# Patient Record
Sex: Female | Born: 1980 | Race: White | Hispanic: No | Marital: Married | Smoking: Former smoker
Health system: Southern US, Community
[De-identification: ages and names within clinical notes are randomized; demographics above are authoritative.]

## PROBLEM LIST (undated history)

## (undated) DIAGNOSIS — G4700 Insomnia, unspecified: Secondary | ICD-10-CM

## (undated) DIAGNOSIS — F32A Depression, unspecified: Secondary | ICD-10-CM

## (undated) DIAGNOSIS — R55 Syncope and collapse: Secondary | ICD-10-CM

## (undated) DIAGNOSIS — N83201 Unspecified ovarian cyst, right side: Secondary | ICD-10-CM

## (undated) DIAGNOSIS — I871 Compression of vein: Secondary | ICD-10-CM

## (undated) DIAGNOSIS — K259 Gastric ulcer, unspecified as acute or chronic, without hemorrhage or perforation: Secondary | ICD-10-CM

## (undated) DIAGNOSIS — Z789 Other specified health status: Secondary | ICD-10-CM

## (undated) DIAGNOSIS — F329 Major depressive disorder, single episode, unspecified: Secondary | ICD-10-CM

## (undated) DIAGNOSIS — I872 Venous insufficiency (chronic) (peripheral): Secondary | ICD-10-CM

## (undated) DIAGNOSIS — F419 Anxiety disorder, unspecified: Secondary | ICD-10-CM

## (undated) HISTORY — PX: OVARIAN CYST SURGERY: SHX726

## (undated) HISTORY — PX: WRIST SURGERY: SHX841

## (undated) HISTORY — PX: TONSILLECTOMY: SUR1361

## (undated) HISTORY — PX: BREAST CYST ASPIRATION: SHX578

## (undated) HISTORY — DX: Insomnia, unspecified: G47.00

## (undated) HISTORY — PX: ABDOMINAL HYSTERECTOMY: SHX81

## (undated) HISTORY — PX: DILATION AND CURETTAGE OF UTERUS: SHX78

## (undated) HISTORY — DX: Unspecified ovarian cyst, right side: N83.201

## (undated) HISTORY — PX: OTHER SURGICAL HISTORY: SHX169

---

## 2015-11-25 HISTORY — PX: WRIST SURGERY: SHX841

## 2016-12-25 HISTORY — PX: OTHER SURGICAL HISTORY: SHX169

## 2017-02-12 ENCOUNTER — Other Ambulatory Visit: Payer: Self-pay | Admitting: Obstetrics and Gynecology

## 2017-02-12 ENCOUNTER — Other Ambulatory Visit (HOSPITAL_COMMUNITY)
Admission: RE | Admit: 2017-02-12 | Discharge: 2017-02-12 | Disposition: A | Discharge status: Home / Self Care | Source: Ambulatory Visit | Attending: Obstetrics and Gynecology | Admitting: Obstetrics and Gynecology

## 2017-02-12 DIAGNOSIS — Z1151 Encounter for screening for human papillomavirus (HPV): Secondary | ICD-10-CM | POA: Diagnosis present

## 2017-02-12 DIAGNOSIS — Z01419 Encounter for gynecological examination (general) (routine) without abnormal findings: Secondary | ICD-10-CM | POA: Insufficient documentation

## 2017-02-13 LAB — CYTOLOGY - PAP
DIAGNOSIS: NEGATIVE
HPV (WINDOPATH): NOT DETECTED

## 2017-05-17 ENCOUNTER — Other Ambulatory Visit: Payer: Self-pay | Admitting: Internal Medicine

## 2017-08-25 HISTORY — PX: ABDOMINAL HYSTERECTOMY: SHX81

## 2017-09-23 ENCOUNTER — Encounter (HOSPITAL_COMMUNITY): Payer: Self-pay

## 2017-09-23 ENCOUNTER — Inpatient Hospital Stay (HOSPITAL_COMMUNITY)
Admission: AD | Admit: 2017-09-23 | Discharge: 2017-09-24 | Disposition: A | Discharge status: Home / Self Care | Source: Ambulatory Visit | Attending: Family Medicine | Admitting: Family Medicine

## 2017-09-23 ENCOUNTER — Inpatient Hospital Stay (HOSPITAL_COMMUNITY)

## 2017-09-23 DIAGNOSIS — Z79899 Other long term (current) drug therapy: Secondary | ICD-10-CM | POA: Insufficient documentation

## 2017-09-23 DIAGNOSIS — G8918 Other acute postprocedural pain: Secondary | ICD-10-CM | POA: Diagnosis not present

## 2017-09-23 DIAGNOSIS — R5082 Postprocedural fever: Secondary | ICD-10-CM | POA: Diagnosis not present

## 2017-09-23 DIAGNOSIS — Z8719 Personal history of other diseases of the digestive system: Secondary | ICD-10-CM | POA: Diagnosis not present

## 2017-09-23 DIAGNOSIS — F419 Anxiety disorder, unspecified: Secondary | ICD-10-CM | POA: Diagnosis not present

## 2017-09-23 DIAGNOSIS — Z9889 Other specified postprocedural states: Secondary | ICD-10-CM

## 2017-09-23 DIAGNOSIS — I872 Venous insufficiency (chronic) (peripheral): Secondary | ICD-10-CM | POA: Diagnosis not present

## 2017-09-23 DIAGNOSIS — F329 Major depressive disorder, single episode, unspecified: Secondary | ICD-10-CM | POA: Insufficient documentation

## 2017-09-23 DIAGNOSIS — R109 Unspecified abdominal pain: Secondary | ICD-10-CM | POA: Diagnosis present

## 2017-09-23 DIAGNOSIS — K5901 Slow transit constipation: Secondary | ICD-10-CM | POA: Insufficient documentation

## 2017-09-23 DIAGNOSIS — M549 Dorsalgia, unspecified: Secondary | ICD-10-CM | POA: Diagnosis present

## 2017-09-23 HISTORY — DX: Anxiety disorder, unspecified: F41.9

## 2017-09-23 HISTORY — DX: Venous insufficiency (chronic) (peripheral): I87.2

## 2017-09-23 HISTORY — DX: Major depressive disorder, single episode, unspecified: F32.9

## 2017-09-23 HISTORY — DX: Gastric ulcer, unspecified as acute or chronic, without hemorrhage or perforation: K25.9

## 2017-09-23 HISTORY — DX: Depression, unspecified: F32.A

## 2017-09-23 LAB — COMPREHENSIVE METABOLIC PANEL
ALK PHOS: 44 U/L (ref 38–126)
ALT: 25 U/L (ref 14–54)
ANION GAP: 10 (ref 5–15)
AST: 26 U/L (ref 15–41)
Albumin: 3.9 g/dL (ref 3.5–5.0)
BILIRUBIN TOTAL: 0.6 mg/dL (ref 0.3–1.2)
BUN: 13 mg/dL (ref 6–20)
CALCIUM: 9.6 mg/dL (ref 8.9–10.3)
CO2: 29 mmol/L (ref 22–32)
Chloride: 100 mmol/L — ABNORMAL LOW (ref 101–111)
Creatinine, Ser: 0.78 mg/dL (ref 0.44–1.00)
Glucose, Bld: 86 mg/dL (ref 65–99)
Potassium: 4.3 mmol/L (ref 3.5–5.1)
Sodium: 139 mmol/L (ref 135–145)
TOTAL PROTEIN: 6.6 g/dL (ref 6.5–8.1)

## 2017-09-23 LAB — CBC WITH DIFFERENTIAL/PLATELET
BASOS PCT: 1 %
Basophils Absolute: 0.1 10*3/uL (ref 0.0–0.1)
EOS ABS: 0.4 10*3/uL (ref 0.0–0.7)
EOS PCT: 6 %
HCT: 33.1 % — ABNORMAL LOW (ref 36.0–46.0)
Hemoglobin: 11.2 g/dL — ABNORMAL LOW (ref 12.0–15.0)
Lymphocytes Relative: 32 %
Lymphs Abs: 1.9 10*3/uL (ref 0.7–4.0)
MCH: 31.6 pg (ref 26.0–34.0)
MCHC: 33.8 g/dL (ref 30.0–36.0)
MCV: 93.5 fL (ref 78.0–100.0)
MONO ABS: 0.5 10*3/uL (ref 0.1–1.0)
MONOS PCT: 8 %
Neutro Abs: 3.2 10*3/uL (ref 1.7–7.7)
Neutrophils Relative %: 53 %
Platelets: 337 10*3/uL (ref 150–400)
RBC: 3.54 MIL/uL — ABNORMAL LOW (ref 3.87–5.11)
RDW: 13 % (ref 11.5–15.5)
WBC: 6 10*3/uL (ref 4.0–10.5)

## 2017-09-23 LAB — URINALYSIS, ROUTINE W REFLEX MICROSCOPIC
BILIRUBIN URINE: NEGATIVE
Bacteria, UA: NONE SEEN
Glucose, UA: NEGATIVE mg/dL
HGB URINE DIPSTICK: NEGATIVE
Ketones, ur: NEGATIVE mg/dL
NITRITE: NEGATIVE
PH: 7 (ref 5.0–8.0)
Protein, ur: NEGATIVE mg/dL
SPECIFIC GRAVITY, URINE: 1.011 (ref 1.005–1.030)

## 2017-09-23 MED ORDER — HYDROMORPHONE HCL 2 MG/ML IJ SOLN
2.0000 mg | INTRAMUSCULAR | Status: DC | PRN
  Administered 2017-09-23: 2 mg via INTRAVENOUS
  Filled 2017-09-23: qty 1

## 2017-09-23 MED ORDER — KETOROLAC TROMETHAMINE 30 MG/ML IJ SOLN
15.0000 mg | Freq: Once | INTRAMUSCULAR | Status: AC
  Administered 2017-09-23: 15 mg via INTRAVENOUS
  Filled 2017-09-23: qty 1

## 2017-09-23 MED ORDER — HYDROMORPHONE HCL 1 MG/ML IJ SOLN
1.0000 mg | INTRAMUSCULAR | Status: DC | PRN
  Administered 2017-09-23: 1 mg via INTRAVENOUS
  Filled 2017-09-23: qty 1

## 2017-09-23 MED ORDER — SODIUM CHLORIDE 0.9 % IV SOLN
INTRAVENOUS | Status: DC
  Administered 2017-09-23: 22:00:00 via INTRAVENOUS

## 2017-09-23 MED ORDER — IOPAMIDOL (ISOVUE-300) INJECTION 61%
100.0000 mL | Freq: Once | INTRAVENOUS | Status: AC | PRN
  Administered 2017-09-23: 100 mL via INTRAVENOUS

## 2017-09-23 NOTE — MAU Provider Note (Signed)
History     CSN: [id number]  Arrival date and time: 09/23/17 2014   First Provider Initiated Contact with Patient 09/23/17 2114      Chief Complaint  Patient presents with  . Abdominal Pain  . Back Pain  . Vaginal Pain   36 y.o. non-pregnant female here with post-op abdominal pain, distention, and fever. She is 6 days post-op from [facility], A&P repair, and TVT sling. She had fever of 103 around noon today. She also reports worsening lower abdominal pain and abdominal distention today. She took Tylenol and her temp was down to 101. She is having 3 soft BMs every day. She started using Miralax yesterday as directed by her surgeon.   Past Medical History:  Diagnosis Date  . Anxiety   . Depression   . Stomach ulcer   . Venous insufficiency     Past Surgical History:  Procedure Laterality Date  . ABDOMINAL HYSTERECTOMY    . anterior posterior vaginal wall repair    . bladder tact    . DILATION AND CURETTAGE OF UTERUS    . left ovary removal    . OVARIAN CYST SURGERY    . TONSILLECTOMY    . WRIST SURGERY      Family History  Problem Relation Age of Onset  . Cancer Sister     Social History  Substance Use Topics  . Smoking status: Never Smoker  . Smokeless tobacco: Never Used  . Alcohol use No    Allergies:  Allergies  Allergen Reactions  . Lunesta [Eszopiclone] Other (See Comments)    hallucinations  . Morphine And Related Other (See Comments)    Severe migraine   . Amoxicillin Rash  . Penicillins Rash    Prescriptions Prior to Admission  Medication Sig Dispense Refill Last Dose  . acetaminophen (TYLENOL) 500 MG tablet Take 1,000 mg by mouth every 6 (six) hours as needed.   09/23/2017 at Unknown time  . buPROPion (WELLBUTRIN SR) 150 MG 12 hr tablet Take 150 mg by mouth 2 (two) times daily.   09/23/2017 at Unknown time  . clonazePAM (KLONOPIN) 1 MG tablet Take 1 mg by mouth daily.   09/22/2017 at Unknown time  . diazepam (VALIUM) 5 MG tablet 5 mg every 6 (six)  hours as needed for anxiety (vaginal route not oral).   09/23/2017 at Unknown time  . gabapentin (NEURONTIN) 100 MG capsule Take 200 mg by mouth 3 (three) times daily.   09/23/2017 at Unknown time  . Multiple Vitamin (MULTIVITAMIN) capsule Take 1 capsule by mouth daily.   09/23/2017 at Unknown time  . omeprazole (PRILOSEC) 10 MG capsule Take 10 mg by mouth daily.   09/23/2017 at Unknown time  . polyethylene glycol (MIRALAX / GLYCOLAX) packet Take 17 g by mouth daily.   09/23/2017 at Unknown time  . sertraline (ZOLOFT) 100 MG tablet Take 200 mg by mouth daily.   09/23/2017 at Unknown time  . zolpidem (AMBIEN) 5 MG tablet Take 5 mg by mouth at bedtime as needed for sleep.   09/22/2017 at Unknown time    Review of Systems  Constitutional: Negative for fever.  Gastrointestinal: Positive for abdominal pain and nausea. Negative for constipation and diarrhea.  Genitourinary: Positive for difficulty urinating (doesn't empty, self cath each void) and pelvic pain. Negative for vaginal bleeding.   Physical Exam   Blood pressure 123/77, pulse 65, temperature 97.8 F (36.6 C), temperature source Oral, resp. rate 20, height  (1.753 m), weight 160 lb (  72.6 kg), SpO2 100 %.  Physical Exam  Constitutional: She is oriented to person, place, and time. She appears well-developed and well-nourished. She appears distressed (appears intermittently uncomfortable).  HENT:  Head: Normocephalic and atraumatic.  Neck: Normal range of motion.  Cardiovascular: Normal rate.   Respiratory: Effort normal. No respiratory distress.  GI: Soft. Bowel sounds are normal. She exhibits distension (mild). She exhibits no mass. There is tenderness (all quadrants). There is no rebound and no guarding.  Genitourinary:  Genitourinary Comments: Speculum: cuff intact, no drainage or hematoma present (exam by Dr. Shawnie Pons)  Musculoskeletal: Normal range of motion.  Neurological: She is alert and oriented to person, place, and time.   Skin: Skin is warm and dry.  Psychiatric: She has a normal mood and affect.   Results for orders placed or performed during the hospital encounter of 09/23/17 (from the past 24 hour(s))  Urinalysis, Routine w reflex microscopic     Status: Abnormal   Collection Time: 09/23/17  8:20 PM  Result Value Ref Range   Color, Urine YELLOW YELLOW   APPearance CLEAR CLEAR   Specific Gravity, Urine 1.011 1.005 - 1.030   pH 7.0 5.0 - 8.0   Glucose, UA NEGATIVE NEGATIVE mg/dL   Hgb urine dipstick NEGATIVE NEGATIVE   Bilirubin Urine NEGATIVE NEGATIVE   Ketones, ur NEGATIVE NEGATIVE mg/dL   Protein, ur NEGATIVE NEGATIVE mg/dL   Nitrite NEGATIVE NEGATIVE   Leukocytes, UA SMALL (A) NEGATIVE   RBC / HPF 0-5 0 - 5 RBC/hpf   WBC, UA 6-30 0 - 5 WBC/hpf   Bacteria, UA NONE SEEN NONE SEEN   Squamous Epithelial / LPF 0-5 (A) NONE SEEN   Mucus PRESENT   CBC with Differential/Platelet     Status: Abnormal   Collection Time: 09/23/17  9:06 PM  Result Value Ref Range   WBC 6.0 4.0 - 10.5 K/uL   RBC 3.54 (L) 3.87 - 5.11 MIL/uL   Hemoglobin 11.2 (L) 12.0 - 15.0 g/dL   HCT 16.1 (L) 09.6 - 04.5 %   MCV 93.5 78.0 - 100.0 fL   MCH 31.6 26.0 - 34.0 pg   MCHC 33.8 30.0 - 36.0 g/dL   RDW 40.9 81.1 - 91.4 %   Platelets 337 150 - 400 K/uL   Neutrophils Relative % 53 %   Neutro Abs 3.2 1.7 - 7.7 K/uL   Lymphocytes Relative 32 %   Lymphs Abs 1.9 0.7 - 4.0 K/uL   Monocytes Relative 8 %   Monocytes Absolute 0.5 0.1 - 1.0 K/uL   Eosinophils Relative 6 %   Eosinophils Absolute 0.4 0.0 - 0.7 K/uL   Basophils Relative 1 %   Basophils Absolute 0.1 0.0 - 0.1 K/uL  Comprehensive metabolic panel     Status: Abnormal   Collection Time: 09/23/17  9:06 PM  Result Value Ref Range   Sodium 139 135 - 145 mmol/L   Potassium 4.3 3.5 - 5.1 mmol/L   Chloride 100 (L) 101 - 111 mmol/L   CO2 29 22 - 32 mmol/L   Glucose, Bld 86 65 - 99 mg/dL   BUN 13 6 - 20 mg/dL   Creatinine, Ser 7.82 0.44 - 1.00 mg/dL   Calcium 9.6 8.9 -  95.6 mg/dL   Total Protein 6.6 6.5 - 8.1 g/dL   Albumin 3.9 3.5 - 5.0 g/dL   AST 26 15 - 41 U/L   ALT 25 14 - 54 U/L   Alkaline Phosphatase 44 38 - 126 U/L  Total Bilirubin 0.6 0.3 - 1.2 mg/dL   GFR calc non Af Amer >60 >60 mL/min   GFR calc Af Amer >60 >60 mL/min   Anion gap 10 5 - 15   Ct Abdomen Pelvis W Contrast  Result Date: 09/24/2017 CLINICAL DATA:  Fever, abdominal pain, and vaginal pain starting today. Postoperative vaginal hysterectomy, vaginal wall repair, bladder and rectal tacking on Monday. EXAM: CT ABDOMEN AND PELVIS WITH CONTRAST TECHNIQUE: Multidetector CT imaging of the abdomen and pelvis was performed using the standard protocol following bolus administration of intravenous contrast. CONTRAST:  ISOVUE-300 IOPAMIDOL (ISOVUE-300) INJECTION 61% COMPARISON:  None. FINDINGS: Lower chest: The lung bases are clear. Hepatobiliary: No focal liver abnormality is seen. No gallstones, gallbladder wall thickening, or biliary dilatation. Pancreas: Unremarkable. No pancreatic ductal dilatation or surrounding inflammatory changes. Spleen: Normal in size without focal abnormality. Adrenals/Urinary Tract: Adrenal glands are unremarkable. Kidneys are normal, without renal calculi, focal lesion, or hydronephrosis. Bladder is unremarkable. Stomach/Bowel: Stomach and small bowel are decompressed. No wall thickening is appreciated. Contrast material progresses to the ileocecal valve suggesting no evidence of small bowel obstruction. The colon is diffusely stool-filled without wall thickening. This suggests constipation. The appendix is normal. There is a small amount of free fluid in the pelvis around the rectum. This could be physiologic, postoperative, or inflammatory. No loculated fluid collections to suggest an abscess. Vascular/Lymphatic: No significant vascular findings are present. No enlarged abdominal or pelvic lymph nodes. Reproductive: Surgical absence of the uterus. Probable involuting  follicles in the right ovary. Ovaries are not enlarged. Other: No free air in the abdomen. Abdominal wall musculature appears intact. Musculoskeletal: No acute or significant osseous findings. IMPRESSION: 1. Surgical absence of the uterus. Probable involuting cyst in the right ovary. Small amount of free fluid in the pelvis could be physiologic, reactive, or postoperative. No loculated fluid to suggest an abscess. 2. No evidence of bowel obstruction or inflammation. 3. Diffusely stool-filled colon suggesting constipation. Electronically Signed   By: Burman Nieves M.D.   On: 09/24/2017 00:16   MAU Course  Procedures Toradol Dilaudid  MDM Labs and CT ordered and reviewed. No evidence of post-op complication including infection. CT indicates constipation and this could be the source of pain. Consult with Dr. Shawnie Pons. Stable for discharge home, continue Miralax TID. Recommend to pt to notify surgeon tomorrow of sx and evaluation here.   Assessment and Plan   1. Post-operative state   2. Slow transit constipation   3.      Post-op fever 4.      Post-op abdominal pain  Discharge home Follow up with Dr. Lula Olszewski tomorrow Return for worsening sx Continue previous meds including Miralax TID  Allergies as of 09/24/2017      Reactions   Lunesta [eszopiclone] Other (See Comments)   hallucinations   Morphine And Related Other (See Comments)   Severe migraine   Amoxicillin Rash   Penicillins Rash      Medication List    TAKE these medications   acetaminophen 500 MG tablet Commonly known as:  TYLENOL Take 1,000 mg by mouth every 6 (six) hours as needed.   buPROPion 150 MG 12 hr tablet Commonly known as:  WELLBUTRIN SR Take 150 mg by mouth 2 (two) times daily.   clonazePAM 1 MG tablet Commonly known as:  KLONOPIN Take 1 mg by mouth daily.   diazepam 5 MG tablet Commonly known as:  VALIUM 5 mg every 6 (six) hours as needed for anxiety (vaginal route  not oral).   gabapentin 100 MG  capsule Commonly known as:  NEURONTIN Take 200 mg by mouth 3 (three) times daily.   multivitamin capsule Take 1 capsule by mouth daily.   omeprazole 10 MG capsule Commonly known as:  PRILOSEC Take 10 mg by mouth daily.   polyethylene glycol packet Commonly known as:  MIRALAX / GLYCOLAX Take 17 g by mouth daily.   sertraline 100 MG tablet Commonly known as:  ZOLOFT Take 200 mg by mouth daily.   zolpidem 5 MG tablet Commonly known as:  AMBIEN Take 5 mg by mouth at bedtime as needed for sleep.            Discharge Care Instructions        Start     Ordered   09/24/17 0000  Discharge patient    Question Answer Comment  Discharge disposition 01-Home or Self Care   Discharge patient date 09/24/2017      09/24/17 0037     Donette Larry, CNM 09/24/2017, 12:39 AM

## 2017-09-23 NOTE — MAU Note (Signed)
Pt here with c/o fever starting today, abdominal, back and vaginal pain. Post surgery patient. Had procedure this past Monday; vaginal hysterectomy, vaginal wall repair, bladder and rectal tacking as well. Reports fever today was 103 at noon, took Tylenol. Was back up to 101.7 at 1930 and took Tylenol again.

## 2017-09-23 NOTE — MAU Note (Signed)
Pt finished drinking her contrast at 2230. Radiology notifed.

## 2017-09-24 DIAGNOSIS — R109 Unspecified abdominal pain: Secondary | ICD-10-CM

## 2017-09-24 DIAGNOSIS — K5901 Slow transit constipation: Secondary | ICD-10-CM

## 2017-09-24 DIAGNOSIS — R5082 Postprocedural fever: Secondary | ICD-10-CM

## 2017-09-24 DIAGNOSIS — G8918 Other acute postprocedural pain: Secondary | ICD-10-CM | POA: Diagnosis not present

## 2017-09-24 NOTE — Discharge Instructions (Signed)

## 2017-09-26 ENCOUNTER — Telehealth: Payer: Self-pay | Admitting: Certified Nurse Midwife

## 2017-09-26 DIAGNOSIS — N390 Urinary tract infection, site not specified: Secondary | ICD-10-CM

## 2017-09-26 DIAGNOSIS — T83511A Infection and inflammatory reaction due to indwelling urethral catheter, initial encounter: Principal | ICD-10-CM

## 2017-09-26 LAB — URINE CULTURE: Culture: 60000 — AB

## 2017-09-26 MED ORDER — SULFAMETHOXAZOLE-TRIMETHOPRIM 800-160 MG PO TABS
1.0000 | ORAL_TABLET | Freq: Two times a day (BID) | ORAL | 0 refills | Status: DC
Start: 2017-09-26 — End: 2017-09-27

## 2017-09-26 NOTE — Telephone Encounter (Signed)
Notified pt of +UTI. Rx for Bactrim sent to pharmacy.

## 2017-09-27 ENCOUNTER — Telehealth: Payer: Self-pay | Admitting: Obstetrics and Gynecology

## 2017-09-27 ENCOUNTER — Inpatient Hospital Stay (HOSPITAL_COMMUNITY)
Admission: AD | Admit: 2017-09-27 | Discharge: 2017-09-27 | Disposition: A | Discharge status: Home / Self Care | Source: Ambulatory Visit | Attending: Obstetrics and Gynecology | Admitting: Obstetrics and Gynecology

## 2017-09-27 DIAGNOSIS — N3000 Acute cystitis without hematuria: Secondary | ICD-10-CM | POA: Diagnosis not present

## 2017-09-27 DIAGNOSIS — G8918 Other acute postprocedural pain: Secondary | ICD-10-CM | POA: Insufficient documentation

## 2017-09-27 DIAGNOSIS — N390 Urinary tract infection, site not specified: Secondary | ICD-10-CM | POA: Diagnosis present

## 2017-09-27 DIAGNOSIS — Z880 Allergy status to penicillin: Secondary | ICD-10-CM | POA: Insufficient documentation

## 2017-09-27 LAB — CBC WITH DIFFERENTIAL/PLATELET
Basophils Absolute: 0 10*3/uL (ref 0.0–0.1)
Basophils Relative: 0 %
EOS PCT: 4 %
Eosinophils Absolute: 0.3 10*3/uL (ref 0.0–0.7)
HCT: 34.3 % — ABNORMAL LOW (ref 36.0–46.0)
HEMOGLOBIN: 11.6 g/dL — AB (ref 12.0–15.0)
LYMPHS ABS: 2.1 10*3/uL (ref 0.7–4.0)
Lymphocytes Relative: 23 %
MCH: 31.4 pg (ref 26.0–34.0)
MCHC: 33.8 g/dL (ref 30.0–36.0)
MCV: 92.7 fL (ref 78.0–100.0)
MONOS PCT: 8 %
Monocytes Absolute: 0.7 10*3/uL (ref 0.1–1.0)
Neutro Abs: 5.8 10*3/uL (ref 1.7–7.7)
Neutrophils Relative %: 65 %
Platelets: 365 10*3/uL (ref 150–400)
RBC: 3.7 MIL/uL — AB (ref 3.87–5.11)
RDW: 13 % (ref 11.5–15.5)
WBC: 8.9 10*3/uL (ref 4.0–10.5)

## 2017-09-27 MED ORDER — CIPROFLOXACIN HCL 250 MG PO TABS: 250.0000 mg | ORAL_TABLET | Freq: Two times a day (BID) | ORAL | 0 refills | Status: DC

## 2017-09-27 MED ORDER — KETOROLAC TROMETHAMINE 60 MG/2ML IM SOLN
60.0000 mg | Freq: Once | INTRAMUSCULAR | Status: AC
  Administered 2017-09-27: 60 mg via INTRAMUSCULAR
  Filled 2017-09-27: qty 2

## 2017-09-27 NOTE — MAU Provider Note (Signed)
History     CSN: [id number]  Arrival date and time: 09/27/17 7253   First Provider Initiated Contact with Patient 09/27/17 1909      Chief Complaint  Patient presents with  . Urinary Tract Infection   HPI  [name] is a 36 y.o. [id number] female who presents to MAU today with complaint of worsening UTI symptoms. The patient had extensive urogynecology surgery last week and developed fever over the weekend. She was seen in MAU on 09/23/17 and called with urine culture results and started on Bactrim for UTI. She states that she had a reaction to the Bactrim and was unable to talk to her surgeon, so she called her PCP. Her PCP called MAU today and stated that she may require IV antibiotics because her culture showed resistance to most antibiotics. In review of the culture from 9/30, it is + E.coli and sensitive to all antibiotics tested. The patient states that she has had low grade temperatures since being seen in MAU, but nothing as high as when she was seen this weekend. She has been taking Tylenol for fever. She is also taking Gabapentin and Oxycodone for pain since the surgery. She has also noted bilateral flank pain today. She is having to self-cath to completely empty her bladder.   OB History    Gravida Para Term Preterm AB Living   SAB TAB Ectopic Multiple Live Births   3       2      Past Medical History:  Diagnosis Date  . Anxiety   . Depression   . Stomach ulcer   . Venous insufficiency     Past Surgical History:  Procedure Laterality Date  . ABDOMINAL HYSTERECTOMY    . anterior posterior vaginal wall repair    . bladder tact    . DILATION AND CURETTAGE OF UTERUS    . left ovary removal    . OVARIAN CYST SURGERY    . TONSILLECTOMY    . WRIST SURGERY      Family History  Problem Relation Age of Onset  . Cancer Sister     Social History  Substance Use Topics  . Smoking status: Never Smoker  . Smokeless tobacco: Never Used  . Alcohol use No      Allergies:  Allergies  Allergen Reactions  . Bactrim [Sulfamethoxazole-Trimethoprim] Other (See Comments)    Mouth sores, stiff neck and H/A  . Lunesta [Eszopiclone] Other (See Comments)    hallucinations  . Morphine And Related Other (See Comments)    Severe migraine   . Amoxicillin Rash  . Penicillins Rash    Prescriptions Prior to Admission  Medication Sig Dispense Refill Last Dose  . acetaminophen (TYLENOL) 500 MG tablet Take 1,000 mg by mouth every 6 (six) hours as needed.   09/23/2017 at Unknown time  . buPROPion (WELLBUTRIN SR) 150 MG 12 hr tablet Take 150 mg by mouth 2 (two) times daily.   09/23/2017 at Unknown time  . clonazePAM (KLONOPIN) 1 MG tablet Take 1 mg by mouth daily.   09/22/2017 at Unknown time  . diazepam (VALIUM) 5 MG tablet 5 mg every 6 (six) hours as needed for anxiety (vaginal route not oral).   09/23/2017 at Unknown time  . gabapentin (NEURONTIN) 100 MG capsule Take 200 mg by mouth 3 (three) times daily.   09/23/2017 at Unknown time  . Multiple Vitamin (MULTIVITAMIN) capsule Take 1 capsule by mouth daily.  09/23/2017 at Unknown time  . omeprazole (PRILOSEC) 10 MG capsule Take 10 mg by mouth daily.   09/23/2017 at Unknown time  . polyethylene glycol (MIRALAX / GLYCOLAX) packet Take 17 g by mouth daily.   09/23/2017 at Unknown time  . sertraline (ZOLOFT) 100 MG tablet Take 200 mg by mouth daily.   09/23/2017 at Unknown time  . zolpidem (AMBIEN) 5 MG tablet Take 5 mg by mouth at bedtime as needed for sleep.   09/22/2017 at Unknown time    Review of Systems  Constitutional: Negative for fever.  Gastrointestinal: Positive for abdominal pain. Negative for constipation, diarrhea, nausea and vomiting.  Genitourinary: Positive for dysuria and flank pain. Negative for frequency, urgency, vaginal bleeding and vaginal discharge.   Physical Exam   Blood pressure 115/67, pulse 60, temperature 98.2 F (36.8 C), temperature source Oral, resp. rate 17, SpO2 100  %.  Physical Exam  Nursing note and vitals reviewed. Constitutional: She is oriented to person, place, and time. She appears well-developed and well-nourished. No distress.  HENT:  Head: Normocephalic and atraumatic.  Cardiovascular: Normal rate.   Respiratory: Effort normal.  GI: Soft. She exhibits no distension (mild lower abdominal tenderness to palpation) and no mass. There is tenderness. There is CVA tenderness (very mild). There is no rebound and no guarding.    Neurological: She is alert and oriented to person, place, and time.  Skin: Skin is warm and dry. No erythema.  Psychiatric: She has a normal mood and affect.    Results for orders placed or performed during the hospital encounter of 09/27/17 (from the past 24 hour(s))  CBC with Differential/Platelet     Status: Abnormal   Collection Time: 09/27/17  7:36 PM  Result Value Ref Range   WBC 8.9 4.0 - 10.5 K/uL   RBC 3.70 (L) 3.87 - 5.11 MIL/uL   Hemoglobin 11.6 (L) 12.0 - 15.0 g/dL   HCT 16.1 (L) 09.6 - 04.5 %   MCV 92.7 78.0 - 100.0 fL   MCH 31.4 26.0 - 34.0 pg   MCHC 33.8 30.0 - 36.0 g/dL   RDW 40.9 81.1 - 91.4 %   Platelets 365 150 - 400 K/uL   Neutrophils Relative % 65 %   Neutro Abs 5.8 1.7 - 7.7 K/uL   Lymphocytes Relative 23 %   Lymphs Abs 2.1 0.7 - 4.0 K/uL   Monocytes Relative 8 %   Monocytes Absolute 0.7 0.1 - 1.0 K/uL   Eosinophils Relative 4 %   Eosinophils Absolute 0.3 0.0 - 0.7 K/uL   Basophils Relative 0 %   Basophils Absolute 0.0 0.0 - 0.1 K/uL     MAU Course  Procedures None  MDM Without significant CVA tenderness or leukocytosis noted today, pyelonephritis seems unlikely. Patient is very uncomfortable, but it is difficult to distinguish whether this is related to her urinary symptoms/diagnosis vs. Post operative pain. It appears to be more diffuse and more likely related to her recent surgery.   Assessment and Plan  A: UTI Post operative pain  P:  Discharge home Rx for Cipro given to  patient  Advised to continue pain medication as previously prescribed Warning signs for worsening condition discussed Patient advised to follow-up with Urogyn if symptoms persist or worsen Patient may return to MAU as needed or if her condition were to change or worsen  Vonzella Nipple, PA-C 09/27/2017, 8:12 PM

## 2017-09-27 NOTE — Telephone Encounter (Signed)
Spoke with patient in regards to the need for an abx.  Patient stated that she spoke with Donette Larry, CNM and Rx was sent for Bactrim.  The patient states that she spoke with her PCP this morning, because after taking the Bactrim for 1 or 2 doses she started having stiff neck, H/A, and sores in her mouth.  Her PCP advised her that she is likely having an allergic reaction to the Bactrim and advised her to stop taking it immediately. She states her PCP was sending her a different Rx for either Keflex or Cipro.  Raelyn Mora, CNM 09/27/2017 10:58 AM

## 2017-09-27 NOTE — Discharge Instructions (Signed)
Urinary Tract Infection, Adult A urinary tract infection (UTI) is an infection of any part of the urinary tract. The urinary tract includes the:  Kidneys.  Ureters.  Bladder.  Urethra.  These organs make, store, and get rid of pee (urine) in the body. Follow these instructions at home:  Take over-the-counter and prescription medicines only as told by your doctor.  If you were prescribed an antibiotic medicine, take it as told by your doctor. Do not stop taking the antibiotic even if you start to feel better.  Avoid the following drinks: ? Alcohol. ? Caffeine. ? Tea. ? Carbonated drinks.  Drink enough fluid to keep your pee clear or pale yellow.  Keep all follow-up visits as told by your doctor. This is important.  Make sure to: ? Empty your bladder often and completely. Do not to hold pee for long periods of time. ? Empty your bladder before and after sex. ? Wipe from front to back after a bowel movement if you are female. Use each tissue one time when you wipe. Contact a doctor if:  You have back pain.  You have a fever.  You feel sick to your stomach (nauseous).  You throw up (vomit).  Your symptoms do not get better after 3 days.  Your symptoms go away and then come back. Get help right away if:  You have very bad back pain.  You have very bad lower belly (abdominal) pain.  You are throwing up and cannot keep down any medicines or water. This information is not intended to replace advice given to you by your health care provider. Make sure you discuss any questions you have with your health care provider. Document Released: 05/29/2008 Document Revised: 05/18/2016 Document Reviewed: 11/01/2015 Elsevier Interactive Patient Education  2018 [employer] Acute Urinary Retention, Female Urinary retention means you are unable to pee completely or at all (empty your bladder). Follow these instructions at home:  Drink enough fluids to keep your pee (urine) clear  or pale yellow.  If you are sent home with a tube that drains the bladder (catheter), there will be a drainage bag attached to it. There are two types of bags. One is big that you can wear at night without having to empty it. One is smaller and needs to be emptied more often. ? Keep the drainage bag emptied. ? Keep the drainage bag lower than the tube.  Only take medicine as told by your doctor. Contact a doctor if:  You have a low-grade fever.  You have spasms or you are leaking pee when you have spasms. Get help right away if:  You have chills or a fever.  Your catheter stops draining pee.  Your catheter falls out.  You have increased bleeding that does not stop after you have rested and increased the amount of fluids you had been drinking. This information is not intended to replace advice given to you by your health care provider. Make sure you discuss any questions you have with your health care provider. Document Released: 05/29/2008 Document Revised: 05/18/2016 Document Reviewed: 05/22/2013 Elsevier Interactive Patient Education  2017 [employer].

## 2017-10-01 ENCOUNTER — Emergency Department (HOSPITAL_COMMUNITY)
Admission: EM | Admit: 2017-10-01 | Discharge: 2017-10-01 | Disposition: A | Discharge status: Home / Self Care | Attending: Emergency Medicine | Admitting: Emergency Medicine

## 2017-10-01 DIAGNOSIS — Z885 Allergy status to narcotic agent status: Secondary | ICD-10-CM | POA: Diagnosis not present

## 2017-10-01 DIAGNOSIS — T368X5A Adverse effect of other systemic antibiotics, initial encounter: Secondary | ICD-10-CM | POA: Diagnosis not present

## 2017-10-01 DIAGNOSIS — T50905A Adverse effect of unspecified drugs, medicaments and biological substances, initial encounter: Secondary | ICD-10-CM | POA: Insufficient documentation

## 2017-10-01 DIAGNOSIS — Z79899 Other long term (current) drug therapy: Secondary | ICD-10-CM | POA: Insufficient documentation

## 2017-10-01 DIAGNOSIS — N3000 Acute cystitis without hematuria: Secondary | ICD-10-CM | POA: Diagnosis not present

## 2017-10-01 DIAGNOSIS — R300 Dysuria: Secondary | ICD-10-CM | POA: Diagnosis present

## 2017-10-01 DIAGNOSIS — L271 Localized skin eruption due to drugs and medicaments taken internally: Secondary | ICD-10-CM | POA: Diagnosis not present

## 2017-10-01 DIAGNOSIS — R1030 Lower abdominal pain, unspecified: Secondary | ICD-10-CM | POA: Diagnosis not present

## 2017-10-01 DIAGNOSIS — Z880 Allergy status to penicillin: Secondary | ICD-10-CM | POA: Insufficient documentation

## 2017-10-01 LAB — URINALYSIS, ROUTINE W REFLEX MICROSCOPIC
Bacteria, UA: NONE SEEN
GLUCOSE, UA: NEGATIVE mg/dL
Hgb urine dipstick: NEGATIVE
KETONES UR: NEGATIVE mg/dL
LEUKOCYTES UA: NEGATIVE
Nitrite: NEGATIVE
PH: 5 (ref 5.0–8.0)
Protein, ur: 30 mg/dL — AB
Specific Gravity, Urine: 1.033 — ABNORMAL HIGH (ref 1.005–1.030)

## 2017-10-01 LAB — COMPREHENSIVE METABOLIC PANEL
ALBUMIN: 4 g/dL (ref 3.5–5.0)
ALT: 17 U/L (ref 14–54)
ANION GAP: 10 (ref 5–15)
AST: 20 U/L (ref 15–41)
Alkaline Phosphatase: 51 U/L (ref 38–126)
BILIRUBIN TOTAL: 0.7 mg/dL (ref 0.3–1.2)
BUN: 8 mg/dL (ref 6–20)
CHLORIDE: 102 mmol/L (ref 101–111)
CO2: 26 mmol/L (ref 22–32)
Calcium: 8.9 mg/dL (ref 8.9–10.3)
Creatinine, Ser: 0.89 mg/dL (ref 0.44–1.00)
GFR calc Af Amer: >60 mL/min (ref >60)
GFR calc non Af Amer: >60 mL/min (ref >60)
GLUCOSE: 96 mg/dL (ref 65–99)
POTASSIUM: 4.2 mmol/L (ref 3.5–5.1)
Sodium: 138 mmol/L (ref 135–145)
TOTAL PROTEIN: 6.6 g/dL (ref 6.5–8.1)

## 2017-10-01 LAB — CBC
HEMATOCRIT: 35.9 % — AB (ref 36.0–46.0)
Hemoglobin: 11.6 g/dL — ABNORMAL LOW (ref 12.0–15.0)
MCH: 30.3 pg (ref 26.0–34.0)
MCHC: 32.3 g/dL (ref 30.0–36.0)
MCV: 93.7 fL (ref 78.0–100.0)
PLATELETS: 397 10*3/uL (ref 150–400)
RBC: 3.83 MIL/uL — ABNORMAL LOW (ref 3.87–5.11)
RDW: 12.8 % (ref 11.5–15.5)
WBC: 8.2 10*3/uL (ref 4.0–10.5)

## 2017-10-01 MED ORDER — CEPHALEXIN 500 MG PO CAPS: 500.0000 mg | ORAL_CAPSULE | Freq: Four times a day (QID) | ORAL | 0 refills | Status: DC

## 2017-10-01 NOTE — ED Triage Notes (Signed)
Pt reports recent hysterectomy, developed UTI thereafter. Has had allergic reaction to bactrim and cipro, today with rash to face. Pt c/o bilateral low back pain. VSS.

## 2017-10-01 NOTE — ED Notes (Signed)
EDP at bedside  

## 2017-10-01 NOTE — ED Notes (Signed)
Patient unhappy with antibiotic prescription. EDP aware

## 2017-10-01 NOTE — ED Provider Notes (Signed)
[facility]-EMERGENCY DEPT Provider Note   CSN: [id number] Arrival date & time: 10/01/17  1253     History   Chief Complaint Chief Complaint  Patient presents with  . Flank Pain    HPI Meghan Barry is a 36 y.o. female.  Meghan Barry is a 36 y.o. Female who presents the emergency department complaining of dysuria and abdominal pain following gynecological surgery. Patient recently had an abdominal hysterectomy on 09/17/2017 at [facility] by Dr. Lula Olszewski. A few days later she developed fever and worsening abdominal pain was seen at [facility]. There she had a urine culture that grew out 60,000 colonies of Escherichia coli that was pansensitive to antibiotics. She was started on Bactrim and had a reaction to this. She was then changed to Cipro and after 2 doses she reports she woke up this morning with a rash on her face. This is since resolved. Should no trouble breathing or swallowing. Ports her urine has been darker than normal. She reports still having dysuria today. She self caths to completely empty her bladder. She reports ongoing bilateral flank pain since the surgery. She reports 2 soft bowel movements today. Patient also reports she's been having fevers at home with a temperature of 101 prior to arrival today. She reports taking a gram of Tylenol at 11 AM this morning. She has been taking gabapentin and alternating with percocet and valium for her pelvic spasms per her surgeon.  She denies nausea, vomiting, diarrhea, hematuria, hematochezia, cough, chest pain or shortness of breath. She denies vaginal bleeding or discharge.   The history is provided by the patient and medical records. No language interpreter was used.  Flank Pain  Associated symptoms include abdominal pain. Pertinent negatives include no chest pain, no headaches and no shortness of breath.    Past Medical History:  Diagnosis Date  . Anxiety   . Depression   . Stomach ulcer   . Venous insufficiency     There are  no active problems to display for this patient.   Past Surgical History:  Procedure Laterality Date  . ABDOMINAL HYSTERECTOMY    . anterior posterior vaginal wall repair    . bladder tact    . DILATION AND CURETTAGE OF UTERUS    . left ovary removal    . OVARIAN CYST SURGERY    . TONSILLECTOMY    . WRIST SURGERY      OB History    Gravida Para Term Preterm AB Living   SAB TAB Ectopic Multiple Live Births   3       2       Home Medications    Prior to Admission medications   Medication Sig Start Date End Date Taking? Authorizing Provider  acetaminophen (TYLENOL) 500 MG tablet Take 1,000 mg by mouth every 6 (six) hours as needed.    [provider]  buPROPion (WELLBUTRIN SR) 150 MG 12 hr tablet Take 150 mg by mouth 2 (two) times daily.    [provider]  cephALEXin (KEFLEX) 500 MG capsule Take 1 capsule (500 mg total) by mouth 4 (four) times daily. 10/01/17   Everlene Farrier, PA-C  clonazePAM (KLONOPIN) 1 MG tablet Take 1 mg by mouth daily.    [provider]  diazepam (VALIUM) 5 MG tablet 5 mg every 6 (six) hours as needed for anxiety (vaginal route not oral).    [provider]  gabapentin (NEURONTIN) 100 MG capsule Take  200 mg by mouth 3 (three) times daily.    [provider]  Multiple Vitamin (MULTIVITAMIN) capsule Take 1 capsule by mouth daily.    [provider]  omeprazole (PRILOSEC) 10 MG capsule Take 10 mg by mouth daily.    [provider]  polyethylene glycol (MIRALAX / GLYCOLAX) packet Take 17 g by mouth daily.    [provider]  sertraline (ZOLOFT) 100 MG tablet Take 200 mg by mouth daily.    [provider]  zolpidem (AMBIEN) 5 MG tablet Take 5 mg by mouth at bedtime as needed for sleep.    [provider]    Family History Family History  Problem Relation Age of Onset  . Cancer Sister     Social History Social History  Substance Use Topics  . Smoking  status: Never Smoker  . Smokeless tobacco: Never Used  . Alcohol use No     Allergies   Bactrim [sulfamethoxazole-trimethoprim]; Lunesta [eszopiclone]; Morphine and related; Amoxicillin; and Penicillins   Review of Systems Review of Systems  Constitutional: Negative for chills and fever.  HENT: Negative for congestion and sore throat.   Eyes: Negative for visual disturbance.  Respiratory: Negative for cough and shortness of breath.   Cardiovascular: Negative for chest pain.  Gastrointestinal: Positive for abdominal pain. Negative for diarrhea, nausea and vomiting.  Genitourinary: Positive for dysuria, flank pain and urgency. Negative for decreased urine volume, difficulty urinating, vaginal bleeding and vaginal discharge.  Musculoskeletal: Negative for back pain and neck pain.  Skin: Negative for rash.  Neurological: Negative for headaches.     Physical Exam Updated Vital Signs BP 105/72   Pulse (!) 51   Temp 98.2 F (36.8 C) (Oral)   Resp 16   SpO2 98%   Physical Exam  Constitutional: She appears well-developed and well-nourished. No distress.  Nontoxic-appearing.  HENT:  Head: Normocephalic and atraumatic.  Mouth/Throat: Oropharynx is clear and moist.  Eyes: Pupils are equal, round, and reactive to light. Conjunctivae are normal. Right eye exhibits no discharge. Left eye exhibits no discharge.  Neck: Neck supple.  Cardiovascular: Normal rate, regular rhythm, normal heart sounds and intact distal pulses.  Exam reveals no gallop and no friction rub.   No murmur heard. Pulmonary/Chest: Effort normal and breath sounds normal. No respiratory distress. She has no wheezes. She has no rales.  Abdominal: Soft. Bowel sounds are normal. She exhibits no distension and no mass. There is tenderness. There is no rebound and no guarding.  Mild tenderness across her bilateral lower abdomen. Mild bilateral CVA tenderness. Abdomen is soft. Bowel sounds are present.  Musculoskeletal: She  exhibits no edema.  Lymphadenopathy:    She has no cervical adenopathy.  Neurological: She is alert. Coordination normal.  Skin: Skin is warm and dry. No rash noted. She is not diaphoretic. No erythema. No pallor.  Psychiatric: She has a normal mood and affect. Her behavior is normal.  Nursing note and vitals reviewed.    ED Treatments / Results  Labs (all labs ordered are listed, but only abnormal results are displayed) Labs Reviewed  CBC - Abnormal; Notable for the following:       Result Value   RBC 3.83 (*)    Hemoglobin 11.6 (*)    HCT 35.9 (*)    All other components within normal limits  URINALYSIS, ROUTINE W REFLEX MICROSCOPIC - Abnormal; Notable for the following:    Color, Urine AMBER (*)    Specific Gravity, Urine 1.033 (*)  Bilirubin Urine SMALL (*)    Protein, ur 30 (*)    Squamous Epithelial / LPF 0-5 (*)    All other components within normal limits  COMPREHENSIVE METABOLIC PANEL    EKG  EKG Interpretation None       Radiology No results found.  Procedures Procedures (including critical care time)  Medications Ordered in ED Medications - No data to display   Initial Impression / Assessment and Plan / ED Course  I have reviewed the triage vital signs and the nursing notes.  Pertinent labs & imaging results that were available during my care of the patient were reviewed by me and considered in my medical decision making (see chart for details).    This is a 36 y.o. Female who presents the emergency department complaining of dysuria and abdominal pain following gynecological surgery. Patient recently had an abdominal hysterectomy on 09/17/2017 at [facility] by Dr. Lula Olszewski. A few days later she developed fever and worsening abdominal pain was seen at [facility]. There she had a urine culture that grew out 60,000 colonies of Escherichia coli that was pansensitive to antibiotics. She was started on Bactrim and had a reaction to this. She was  then changed to Cipro and after 2 doses she reports she woke up this morning with a rash on her face. This is since resolved. Should no trouble breathing or swallowing. Ports her urine has been darker than normal. She reports still having dysuria today. She self caths to completely empty her bladder. She reports ongoing bilateral flank pain since the surgery. She reports 2 soft bowel movements today. Patient also reports she's been having fevers at home with a temperature of 101 prior to arrival today. She reports taking a gram of Tylenol at 11 AM this morning. She has been taking gabapentin and alternating with percocet and valium for her pelvic spasms per her surgeon. On exam the patient is afebrile and nontoxic appearing. Her abdomen is soft and she has mild tenderness across her bilateral lower abdomen and mild bilateral CVA tenderness. Patient appears comfortable during exam. Surgical scars to her pelvis appear well-healed.  Urinalysis is nitrite negative and leukocyte negative. No bacteria seen. Specific gravity is 1.033.  CBC shows no leukocytosis. CMP is unremarkable. Normal kidney function. I discussed with patient that allergic reaction to Cipro with only facial rash seems somewhat unlikely. However, we discussed that the safest option to be to change the antibiotic. Patient has a history of a rash when taking penicillin antibiotics. Will try Keflex as her culture is pansensitive and this is usually well tolerated. I did encourage her to follow-up with her OB/GYN. She agrees. Also discussed strict and specific return precautions. I advised if she has persistent fevers, worsening abdominal pain or vomiting to return immediately. I advised the patient to follow-up with their primary care provider this week. I advised the patient to return to the emergency department with new or worsening symptoms or new concerns. The patient verbalized understanding and agreement with plan.    This patient was  discussed with Dr. Lynelle Doctor who agrees with assessment and plan.   Later, after discharge, I was called back to bedside by RN. Patient tells me she is upset that she waited this long in the ED and then I gave her Keflex, "which is a penicillin and has a 65% chance of giving me an allergic reaction." I discussed that there is about a 4% chance, but with her history of only a rash with  PCN a reaction is very unlikely, like we spoke about. She still appears upset and I asked her what was upsetting her. She tells me she is still upset because I did not give her any pain medication. I apologized and offered her pain medication. Honestly, the patient appeared comfortable during exam and I did not offer her any pain medication at my initial exam, nor did she ask for anything for pain. She refused my offer for pain medication and tells me the "damage is done" and the first thing I should have done was give her pain medication. 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 I again apologized and offered her pain medication and she again refuses and tells me she will just take her pain medication at home as she is being discharged. I encouraged her again to follow up with her OB/GYN.    Final Clinical Impressions(s) / ED Diagnoses   Final diagnoses:  Acute cystitis without hematuria  Adverse effect of drug, initial encounter    New Prescriptions New Prescriptions   CEPHALEXIN (KEFLEX) 500 MG CAPSULE    Take 1 capsule (500 mg total) by mouth 4 (four) times daily.     Everlene Farrier, PA-C 10/01/17 Luiz Iron    Linwood Dibbles, MD 10/03/17 [phone]

## 2017-10-01 NOTE — ED Notes (Signed)
EDP made aware that patient continues to ask for pain medicine

## 2017-11-30 ENCOUNTER — Other Ambulatory Visit: Payer: Self-pay | Admitting: Family Medicine

## 2017-11-30 DIAGNOSIS — N6310 Unspecified lump in the right breast, unspecified quadrant: Secondary | ICD-10-CM

## 2017-11-30 DIAGNOSIS — N6320 Unspecified lump in the left breast, unspecified quadrant: Secondary | ICD-10-CM

## 2017-12-07 ENCOUNTER — Ambulatory Visit
Admission: RE | Admit: 2017-12-07 | Discharge: 2017-12-07 | Disposition: A | Discharge status: Home / Self Care | Source: Ambulatory Visit | Attending: Family Medicine | Admitting: Family Medicine

## 2017-12-07 DIAGNOSIS — N6320 Unspecified lump in the left breast, unspecified quadrant: Secondary | ICD-10-CM

## 2017-12-07 DIAGNOSIS — N6310 Unspecified lump in the right breast, unspecified quadrant: Secondary | ICD-10-CM

## 2018-07-30 ENCOUNTER — Emergency Department (HOSPITAL_COMMUNITY)

## 2018-07-30 ENCOUNTER — Observation Stay (HOSPITAL_COMMUNITY)
Admission: EM | Admit: 2018-07-30 | Discharge: 2018-08-01 | Disposition: A | Discharge status: Home / Self Care | Attending: Internal Medicine | Admitting: Internal Medicine

## 2018-07-30 ENCOUNTER — Encounter (HOSPITAL_COMMUNITY): Payer: Self-pay | Admitting: *Deleted

## 2018-07-30 ENCOUNTER — Other Ambulatory Visit: Payer: Self-pay

## 2018-07-30 DIAGNOSIS — Z79891 Long term (current) use of opiate analgesic: Secondary | ICD-10-CM | POA: Diagnosis not present

## 2018-07-30 DIAGNOSIS — K2950 Unspecified chronic gastritis without bleeding: Secondary | ICD-10-CM | POA: Diagnosis not present

## 2018-07-30 DIAGNOSIS — R911 Solitary pulmonary nodule: Secondary | ICD-10-CM | POA: Insufficient documentation

## 2018-07-30 DIAGNOSIS — F329 Major depressive disorder, single episode, unspecified: Secondary | ICD-10-CM | POA: Insufficient documentation

## 2018-07-30 DIAGNOSIS — K769 Liver disease, unspecified: Secondary | ICD-10-CM | POA: Insufficient documentation

## 2018-07-30 DIAGNOSIS — F419 Anxiety disorder, unspecified: Secondary | ICD-10-CM | POA: Insufficient documentation

## 2018-07-30 DIAGNOSIS — I872 Venous insufficiency (chronic) (peripheral): Secondary | ICD-10-CM | POA: Diagnosis not present

## 2018-07-30 DIAGNOSIS — D509 Iron deficiency anemia, unspecified: Secondary | ICD-10-CM | POA: Insufficient documentation

## 2018-07-30 DIAGNOSIS — K5909 Other constipation: Secondary | ICD-10-CM | POA: Insufficient documentation

## 2018-07-30 DIAGNOSIS — Z8711 Personal history of peptic ulcer disease: Secondary | ICD-10-CM | POA: Diagnosis not present

## 2018-07-30 DIAGNOSIS — R1011 Right upper quadrant pain: Secondary | ICD-10-CM

## 2018-07-30 DIAGNOSIS — Z880 Allergy status to penicillin: Secondary | ICD-10-CM | POA: Diagnosis not present

## 2018-07-30 DIAGNOSIS — R1010 Upper abdominal pain, unspecified: Secondary | ICD-10-CM | POA: Diagnosis present

## 2018-07-30 DIAGNOSIS — K279 Peptic ulcer, site unspecified, unspecified as acute or chronic, without hemorrhage or perforation: Secondary | ICD-10-CM | POA: Diagnosis present

## 2018-07-30 DIAGNOSIS — K2970 Gastritis, unspecified, without bleeding: Secondary | ICD-10-CM | POA: Diagnosis present

## 2018-07-30 DIAGNOSIS — F32A Depression, unspecified: Secondary | ICD-10-CM | POA: Diagnosis present

## 2018-07-30 DIAGNOSIS — R109 Unspecified abdominal pain: Secondary | ICD-10-CM | POA: Diagnosis present

## 2018-07-30 DIAGNOSIS — G8929 Other chronic pain: Secondary | ICD-10-CM | POA: Insufficient documentation

## 2018-07-30 DIAGNOSIS — R102 Pelvic and perineal pain: Secondary | ICD-10-CM | POA: Insufficient documentation

## 2018-07-30 DIAGNOSIS — Z87891 Personal history of nicotine dependence: Secondary | ICD-10-CM | POA: Insufficient documentation

## 2018-07-30 DIAGNOSIS — R52 Pain, unspecified: Secondary | ICD-10-CM

## 2018-07-30 LAB — URINALYSIS, ROUTINE W REFLEX MICROSCOPIC
BILIRUBIN URINE: NEGATIVE
Glucose, UA: NEGATIVE mg/dL
Hgb urine dipstick: NEGATIVE
KETONES UR: NEGATIVE mg/dL
Leukocytes, UA: NEGATIVE
Nitrite: NEGATIVE
PROTEIN: NEGATIVE mg/dL
Specific Gravity, Urine: 1.005 (ref 1.005–1.030)
pH: 7 (ref 5.0–8.0)

## 2018-07-30 LAB — COMPREHENSIVE METABOLIC PANEL
ALBUMIN: 4.2 g/dL (ref 3.5–5.0)
ALK PHOS: 52 U/L (ref 38–126)
ALT: 17 U/L (ref 0–44)
AST: 21 U/L (ref 15–41)
Anion gap: 12 (ref 5–15)
BUN: 9 mg/dL (ref 6–20)
CALCIUM: 9.3 mg/dL (ref 8.9–10.3)
CO2: 26 mmol/L (ref 22–32)
Chloride: 100 mmol/L (ref 98–111)
Creatinine, Ser: 0.92 mg/dL (ref 0.44–1.00)
GFR calc Af Amer: >60 mL/min (ref >60)
GLUCOSE: 103 mg/dL — AB (ref 70–99)
Potassium: 3.6 mmol/L (ref 3.5–5.1)
Sodium: 138 mmol/L (ref 135–145)
Total Bilirubin: 0.9 mg/dL (ref 0.3–1.2)
Total Protein: 7 g/dL (ref 6.5–8.1)

## 2018-07-30 LAB — CBC
HCT: 36.3 % (ref 36.0–46.0)
Hemoglobin: 11.4 g/dL — ABNORMAL LOW (ref 12.0–15.0)
MCH: 26.9 pg (ref 26.0–34.0)
MCHC: 31.4 g/dL (ref 30.0–36.0)
MCV: 85.6 fL (ref 78.0–100.0)
PLATELETS: 369 10*3/uL (ref 150–400)
RBC: 4.24 MIL/uL (ref 3.87–5.11)
RDW: 13.9 % (ref 11.5–15.5)
WBC: 4.8 10*3/uL (ref 4.0–10.5)

## 2018-07-30 LAB — I-STAT BETA HCG BLOOD, ED (MC, WL, AP ONLY)

## 2018-07-30 LAB — LIPASE, BLOOD: Lipase: 27 U/L (ref 11–51)

## 2018-07-30 MED ORDER — GI COCKTAIL ~~LOC~~
30.0000 mL | Freq: Once | ORAL | Status: AC
  Administered 2018-07-30: 30 mL via ORAL
  Filled 2018-07-30: qty 30

## 2018-07-30 MED ORDER — ACETAMINOPHEN 325 MG PO TABS
650.0000 mg | ORAL_TABLET | Freq: Four times a day (QID) | ORAL | Status: DC | PRN
  Administered 2018-07-31 – 2018-08-01 (×3): 650 mg via ORAL
  Filled 2018-07-30 (×3): qty 2

## 2018-07-30 MED ORDER — PANTOPRAZOLE SODIUM 40 MG IV SOLR
40.0000 mg | Freq: Two times a day (BID) | INTRAVENOUS | Status: DC
  Administered 2018-07-31 – 2018-08-01 (×3): 40 mg via INTRAVENOUS
  Filled 2018-07-30 (×7): qty 40

## 2018-07-30 MED ORDER — ACETAMINOPHEN 650 MG RE SUPP: 650.0000 mg | Freq: Four times a day (QID) | RECTAL | Status: DC | PRN

## 2018-07-30 MED ORDER — ONDANSETRON HCL 4 MG/2ML IJ SOLN
4.0000 mg | Freq: Once | INTRAMUSCULAR | Status: AC
  Administered 2018-07-30: 4 mg via INTRAVENOUS
  Filled 2018-07-30: qty 2

## 2018-07-30 MED ORDER — IOPAMIDOL (ISOVUE-370) INJECTION 76%
100.0000 mL | Freq: Once | INTRAVENOUS | Status: AC | PRN
  Administered 2018-07-30: 100 mL via INTRAVENOUS

## 2018-07-30 MED ORDER — ONDANSETRON HCL 4 MG/2ML IJ SOLN
4.0000 mg | Freq: Four times a day (QID) | INTRAMUSCULAR | Status: DC | PRN
  Administered 2018-07-31: 4 mg via INTRAVENOUS
  Filled 2018-07-30 (×2): qty 2

## 2018-07-30 MED ORDER — SODIUM CHLORIDE 0.9 % IV BOLUS
1000.0000 mL | Freq: Once | INTRAVENOUS | Status: AC
  Administered 2018-07-30: 1000 mL via INTRAVENOUS

## 2018-07-30 MED ORDER — ZOLPIDEM TARTRATE 5 MG PO TABS
5.0000 mg | ORAL_TABLET | Freq: Every evening | ORAL | Status: DC | PRN
  Filled 2018-07-30: qty 1

## 2018-07-30 MED ORDER — BUPROPION HCL ER (SR) 150 MG PO TB12
150.0000 mg | ORAL_TABLET | Freq: Two times a day (BID) | ORAL | Status: DC
  Administered 2018-08-01: 150 mg via ORAL
  Filled 2018-07-30 (×2): qty 1

## 2018-07-30 MED ORDER — FENTANYL CITRATE (PF) 100 MCG/2ML IJ SOLN
50.0000 ug | Freq: Once | INTRAMUSCULAR | Status: AC
  Administered 2018-07-30: 50 ug via INTRAVENOUS
  Filled 2018-07-30: qty 2

## 2018-07-30 MED ORDER — FAMOTIDINE IN NACL 20-0.9 MG/50ML-% IV SOLN
20.0000 mg | Freq: Once | INTRAVENOUS | Status: AC
  Administered 2018-07-30: 20 mg via INTRAVENOUS
  Filled 2018-07-30: qty 50

## 2018-07-30 MED ORDER — SERTRALINE HCL 100 MG PO TABS
200.0000 mg | ORAL_TABLET | Freq: Every day | ORAL | Status: DC
  Administered 2018-07-30 – 2018-07-31 (×2): 200 mg via ORAL
  Filled 2018-07-30 (×2): qty 2

## 2018-07-30 MED ORDER — HYDROMORPHONE HCL 1 MG/ML IJ SOLN
1.0000 mg | INTRAMUSCULAR | Status: DC | PRN
  Administered 2018-07-30 – 2018-07-31 (×3): 1 mg via INTRAVENOUS
  Filled 2018-07-30 (×3): qty 1

## 2018-07-30 MED ORDER — ONDANSETRON HCL 4 MG PO TABS: 4.0000 mg | ORAL_TABLET | Freq: Four times a day (QID) | ORAL | Status: DC | PRN

## 2018-07-30 MED ORDER — LUBIPROSTONE 24 MCG PO CAPS
24.0000 ug | ORAL_CAPSULE | Freq: Every day | ORAL | Status: DC
  Administered 2018-07-31: 24 ug via ORAL
  Filled 2018-07-30 (×3): qty 1

## 2018-07-30 MED ORDER — HYDROMORPHONE HCL 1 MG/ML IJ SOLN
1.0000 mg | Freq: Once | INTRAMUSCULAR | Status: DC
Start: 2018-07-30 — End: 2018-07-30
  Filled 2018-07-30: qty 1

## 2018-07-30 MED ORDER — CLONAZEPAM 1 MG PO TABS
1.0000 mg | ORAL_TABLET | Freq: Every day | ORAL | Status: DC
  Administered 2018-07-30: 1 mg via ORAL
  Filled 2018-07-30: qty 2

## 2018-07-30 MED ORDER — HYDROMORPHONE HCL 1 MG/ML IJ SOLN
0.5000 mg | INTRAMUSCULAR | Status: DC | PRN
  Administered 2018-07-30: 0.5 mg via INTRAVENOUS
  Filled 2018-07-30: qty 1

## 2018-07-30 MED ORDER — IOPAMIDOL (ISOVUE-370) INJECTION 76%
INTRAVENOUS | Status: AC
  Filled 2018-07-30: qty 100

## 2018-07-30 MED ORDER — CLONAZEPAM 1 MG PO TABS
1.0000 mg | ORAL_TABLET | Freq: Every day | ORAL | Status: DC
  Administered 2018-07-31: 1 mg via ORAL
  Filled 2018-07-30: qty 1

## 2018-07-30 NOTE — Progress Notes (Signed)
Report received. Bed available made for patient

## 2018-07-30 NOTE — ED Provider Notes (Signed)
  Physical Exam  BP (!) 95/54   Pulse (!) 54   Temp 98 F (36.7 C) (Oral)   Resp 17   SpO2 100%   Physical Exam Vital Signs and Nursing Notes reviewed Vitals:   07/30/18 2000 07/30/18 2030  BP: 109/67 (!) 95/54  Pulse: 60 (!) 54  Resp:    Temp:    SpO2: 100% 100%    CONSTITUTIONAL: Well-appearing, NAD NEURO:  Alert and oriented x 3, no focal deficits EYES:  eyes equal and reactive ENT/NECK:  no LAD, no JVD CARDIO: Regular rate, well-perfused, normal S1 and S2 PULM:  CTAB no wheezing or rhonchi GI/GU:  normal bowel sounds, non-distended, non-tender MSK/SPINE:  No gross deformities, no edema SKIN:  no rash, atraumatic PSYCH:  Appropriate speech and behavior  ED Course/Procedures     Procedures  MDM  I received signout on this patient from Dr. Denton LankSteinl.  Imaging unremarkable, patient now recalls that she had a similar episode of pain back in 2015 that required admission, EGD, which revealed significant gastritis.  Patient continues to require multiple doses of opioids to manage pain, admitted to hospitalist service for pain control and possible GI consultation in the morning.  Elmer SowMichael M. Pilar PlateBero, MD [facility] [email or web address]        Sabas SousBero, Haileyann Staiger M, MD 07/30/18 2239

## 2018-07-30 NOTE — ED Triage Notes (Signed)
Pt presents with RUQ pain that started about 1800 last pm and has gradually worsened.  Pain radiates up under right ribs up to neck and right shoulder blade.  Reports bloating and states with breathing pain is worse.  She feels dizzy.

## 2018-07-30 NOTE — ED Notes (Signed)
Pt c/o increasing pain

## 2018-07-30 NOTE — ED Provider Notes (Signed)
[facility] EMERGENCY DEPARTMENT Provider Note   CSN: [id number] Arrival date & time: 07/30/18  1100     History   Chief Complaint Chief Complaint  Patient presents with  . Abdominal Pain  . Neck Pain    HPI Meghan Barry is a 37 y.o. female.  Patient c/o ruq abd pain since last night. Pain constant, dull, severe, radiates towards shoulder blade. No hx same pain. No injury/trauma to area. +nausea. No vomiting or diarrhea. No fever or chills. No chest pain or sob. No cough or uri symptoms. No hematuria, dysuria or gu symptoms. Remote hx hysterectomy.   The history is provided by the patient.  Abdominal Pain   Pertinent negatives include fever, vomiting, dysuria, hematuria and headaches.  Neck Pain   Pertinent negatives include no chest pain and no headaches.    Past Medical History:  Diagnosis Date  . Anxiety   . Depression   . Stomach ulcer   . Venous insufficiency     There are no active problems to display for this patient.   Past Surgical History:  Procedure Laterality Date  . ABDOMINAL HYSTERECTOMY    . anterior posterior vaginal wall repair    . bladder tact    . DILATION AND CURETTAGE OF UTERUS    . left ovary removal    . OVARIAN CYST SURGERY    . TONSILLECTOMY    . WRIST SURGERY       OB History    Gravida  5   Para  2   Term  2   Preterm      AB  3   Living  2     SAB  3   TAB      Ectopic      Multiple      Live Births  2            Home Medications    Prior to Admission medications   Medication Sig Start Date End Date Taking? Authorizing Provider  acetaminophen (TYLENOL) 500 MG tablet Take 1,000 mg by mouth every 6 (six) hours as needed.    [provider]  buPROPion (WELLBUTRIN SR) 150 MG 12 hr tablet Take 150 mg by mouth 2 (two) times daily.    [provider]  cephALEXin (KEFLEX) 500 MG capsule Take 1 capsule (500 mg total) by mouth 4 (four) times daily. 10/01/17   Everlene Farrier,  PA-C  clonazePAM (KLONOPIN) 1 MG tablet Take 1 mg by mouth daily.    [provider]  diazepam (VALIUM) 5 MG tablet 5 mg every 6 (six) hours as needed for anxiety (vaginal route not oral).    [provider]  gabapentin (NEURONTIN) 100 MG capsule Take 200 mg by mouth 3 (three) times daily.    [provider]  Multiple Vitamin (MULTIVITAMIN) capsule Take 1 capsule by mouth daily.    [provider]  omeprazole (PRILOSEC) 10 MG capsule Take 10 mg by mouth daily.    [provider]  polyethylene glycol (MIRALAX / GLYCOLAX) packet Take 17 g by mouth daily.    [provider]  sertraline (ZOLOFT) 100 MG tablet Take 200 mg by mouth daily.    [provider]  zolpidem (AMBIEN) 5 MG tablet Take 5 mg by mouth at bedtime as needed for sleep.    [provider]    Family History Family History  Problem Relation Age of Onset  . Cancer Sister  Social History Social History   Tobacco Use  . Smoking status: Never Smoker  . Smokeless tobacco: Never Used  Substance Use Topics  . Alcohol use: No  . Drug use: No     Allergies   Bactrim [sulfamethoxazole-trimethoprim]; Lunesta [eszopiclone]; Morphine and related; Amoxicillin; and Penicillins   Review of Systems Review of Systems  Constitutional: Negative for fever.  HENT: Negative for sore throat.   Eyes: Negative for redness.  Respiratory: Negative for shortness of breath.   Cardiovascular: Negative for chest pain.  Gastrointestinal: Positive for abdominal pain. Negative for vomiting.  Genitourinary: Negative for dysuria, flank pain and hematuria.  Musculoskeletal: Negative for back pain.  Skin: Negative for rash.  Neurological: Negative for headaches.  Hematological: Does not bruise/bleed easily.  Psychiatric/Behavioral: Negative for confusion.     Physical Exam Updated Vital Signs BP (!) 128/92 (BP Location: Right Arm)   Pulse 69   Temp 98 F (36.7 C)  (Oral)   Resp (!) 22   SpO2 100%   Physical Exam  Constitutional: She appears well-developed and well-nourished.  HENT:  Mouth/Throat: Oropharynx is clear and moist.  Eyes: Conjunctivae are normal. No scleral icterus.  Neck: Neck supple. No tracheal deviation present.  Cardiovascular: Normal rate, regular rhythm, normal heart sounds and intact distal pulses. Exam reveals no gallop and no friction rub.  No murmur heard. Pulmonary/Chest: Effort normal and breath sounds normal. No respiratory distress.  Abdominal: Soft. Normal appearance and bowel sounds are normal. She exhibits no distension and no mass. There is tenderness. There is no rebound and no guarding. No hernia.  ruq tenderness.   Genitourinary:  Genitourinary Comments: No cva tenderness  Musculoskeletal: She exhibits no edema.  Neurological: She is alert.  Skin: Skin is warm and dry. No rash noted.  Psychiatric: She has a normal mood and affect.  Nursing note and vitals reviewed.    ED Treatments / Results  Labs (all labs ordered are listed, but only abnormal results are displayed) Results for orders placed or performed during the hospital encounter of 07/30/18  Lipase, blood  Result Value Ref Range   Lipase 27 11 - 51 U/L  Comprehensive metabolic panel  Result Value Ref Range   Sodium 138 135 - 145 mmol/L   Potassium 3.6 3.5 - 5.1 mmol/L   Chloride 100 98 - 111 mmol/L   CO2 26 22 - 32 mmol/L   Glucose, Bld 103 (H) 70 - 99 mg/dL   BUN 9 6 - 20 mg/dL   Creatinine, Ser 3.290.92 0.44 - 1.00 mg/dL   Calcium 9.3 8.9 - 51.810.3 mg/dL   Total Protein 7.0 6.5 - 8.1 g/dL   Albumin 4.2 3.5 - 5.0 g/dL   AST 21 15 - 41 U/L   ALT 17 0 - 44 U/L   Alkaline Phosphatase 52 38 - 126 U/L   Total Bilirubin 0.9 0.3 - 1.2 mg/dL   GFR calc non Af Amer >60 >60 mL/min   GFR calc Af Amer >60 >60 mL/min   Anion gap 12 5 - 15  CBC  Result Value Ref Range   WBC 4.8 4.0 - 10.5 K/uL   RBC 4.24 3.87 - 5.11 MIL/uL   Hemoglobin 11.4 (L) 12.0 -  15.0 g/dL   HCT 84.136.3 66.036.0 - 63.046.0 %   MCV 85.6 78.0 - 100.0 fL   MCH 26.9 26.0 - 34.0 pg   MCHC 31.4 30.0 - 36.0 g/dL   RDW 16.013.9 10.911.5 - 32.315.5 %   Platelets 369  150 - 400 K/uL  I-Stat beta hCG blood, ED  Result Value Ref Range   I-stat hCG, quantitative <5.0 <5 mIU/mL   Comment 3           EKG EKG Interpretation  Date/Time:  Tuesday July 30 2018 11:10:20 EDT Ventricular Rate:  71 PR Interval:  124 QRS Duration: 76 QT Interval:  410 QTC Calculation: 445 R Axis:   70 Text Interpretation:  Normal sinus rhythm Normal ECG Confirmed by Cathren Laine ([id number]) on 07/30/2018 11:22:04 AM Also confirmed by Cathren Laine ([id number]), editor Josephine Igo [phone])  on 07/30/2018 11:58:06 AM   Radiology US Abdomen Limited  Result Date: 07/30/2018 CLINICAL DATA:  Right upper quadrant pain for the past 2 days. EXAM: ULTRASOUND ABDOMEN LIMITED RIGHT UPPER QUADRANT COMPARISON:  CT abdomen pelvis dated September 23, 2017. FINDINGS: Gallbladder: No gallstones or wall thickening visualized. No sonographic Murphy sign noted by sonographer. Common bile duct: Diameter: 2 mm, normal. Liver: No focal lesion identified. Mildly increased parenchymal echogenicity. Portal vein is patent on color Doppler imaging with normal direction of blood flow towards the liver. IMPRESSION: 1. No acute abnormality. 2. Mild hepatic steatosis. Electronically Signed   By: Obie Dredge M.D.   On: 07/30/2018 13:02    Procedures Procedures (including critical care time)  Medications Ordered in ED Medications  sodium chloride 0.9 % bolus 1,000 mL (1,000 mLs Intravenous New Bag/Given 07/30/18 1145)  HYDROmorphone (DILAUDID) injection 1 mg (1 mg Intravenous Refused 07/30/18 1141)  ondansetron (ZOFRAN) injection 4 mg (4 mg Intravenous Given 07/30/18 1141)  fentaNYL (SUBLIMAZE) injection 50 mcg (50 mcg Intravenous Given 07/30/18 1206)     Initial Impression / Assessment and Plan / ED Course  I have reviewed the triage vital signs and the  nursing notes.  Pertinent labs & imaging results that were available during my care of the patient were reviewed by me and considered in my medical decision making (see chart for details).  Iv ns.   Pt requests fentanyl for pain (refuses dilaudid). Fentanyl 50 mcg iv. zofran iv.   Labs.   Reviewed nursing notes and prior charts for additional history.   Labs reviewed - chem/lipase normal.   U/s reviewed - neg for acute gb disease.   Pt continue to c/o severe pain to upper abd, flank, chest. abd soft nt. No dyspnea. Pulse ox 100%. Given severe, waxing/waning pain, ?colicky in nature, will get CT imaging.   Fentanyl for pain. Pain improved w med, but persists.   Imaging pending. 1550 Signed out to Dr Pilar Plate to check CTA results, recheck pt, and dispo appropriately.      Final Clinical Impressions(s) / ED Diagnoses   Final diagnoses:  None    ED Discharge Orders    None       Cathren Laine, MD 07/30/18 1550

## 2018-07-30 NOTE — Progress Notes (Signed)
22:34 -Patient arrived to unit , alert and oriented x4. Rating pain 10/10 to her RUQ. Last received pain medication at 2107. Patient stated " last pain medication did not do anything to me".  Patient also stated to nurse " I want either my dilaudid dosage increased or I want a fentanyl PCA pump". Dr Mariea ClontsEmokpae paged.

## 2018-07-30 NOTE — ED Notes (Signed)
ED Provider at bedside. 

## 2018-07-30 NOTE — ED Notes (Signed)
Pt returned from US

## 2018-07-30 NOTE — ED Notes (Signed)
Pt ambulated to restroom with family and NT

## 2018-07-30 NOTE — H&P (Signed)
History and Physical    Storm Leccese AVW:[id number] DOB: 10/15/1981 DOA: 07/30/2018  PCP: Meghan BussingLenell AntuLe, Thao P, DO   Patient coming from: Home   Chief Complaint: Abdominal Pain  HPI: Meghan Barry is a 37 y.o. female with medical history significant for anxiety depression peptic ulcer.  Patient presented to the ED with complaints of abdominal pain right upper quadrant and  epigastric, that started last night.  Abdominal pain radiated to the back (Scapular region) and associated with nausea, no vomiting, or bloating.  Reports 3 watery stools this morning. Patient reports history of peptic ulcer disease in 2015 after EGD was done [facility]( [place] [facility] [place] which showed nonbleeding ulcers and gastritis, with "  With adhesions",  Negative H. pylori and biopsy.  Patient reports compliance with twice daily omeprazole since then.  Denies NSAID use or alcohol intake. No dysuria.  Reports dizziness in the ED.  No chills or fevers.  ED Course: Stable vitals.  WBC 4.8.  Hemoglobin 11.4.  Unremarkable CMP normal liver enzymes. RUQ Us-no acute abnormality mild hepatic steatosis.  With abdominal pain migrating to was epigastric area CT angios chest and abdomen done to rule out dissection-no thoracic aortic aneurysm, no dissection, no PE. 234mm-nodular opacity in the lateral segment right middle lobe.  No abscess.  Uterus absent. Patient was given multiple doses of fentanyl with persistent pain.  GI cocktail helped relieve some of the pain.  EDP successfully paged GI in the ED, awaiting call back.   Review of Systems: As per HPI all other systems reviewed and negative.  Past Medical History:  Diagnosis Date  . Anxiety   . Depression   . Stomach ulcer   . Venous insufficiency     Past Surgical History:  Procedure Laterality Date  . ABDOMINAL HYSTERECTOMY    . anterior posterior vaginal wall repair    . bladder tact    . DILATION AND CURETTAGE OF UTERUS    . left ovary removal    . OVARIAN CYST SURGERY    .  TONSILLECTOMY    . WRIST SURGERY       reports that she has never smoked. She has never used smokeless tobacco. She reports that she does not drink alcohol or use drugs.  Allergies  Allergen Reactions  . Ciprofloxacin Hives    flushed face  . Bactrim [Sulfamethoxazole-Trimethoprim] Other (See Comments)    Mouth sores, stiff neck and H/A  . Codeine Other (See Comments)    Flushed red face, migraines  . Lunesta [Eszopiclone] Other (See Comments)    hallucinations  . Morphine And Related Other (See Comments)    Severe migraine   . Amoxicillin Rash  . Penicillins Rash    Childhood reaction    Family History  Problem Relation Age of Onset  . Cancer Sister     Prior to Admission medications   Medication Sig Start Date End Date Taking? Authorizing Provider  acetaminophen (TYLENOL) 500 MG tablet Take 1,000 mg by mouth every 6 (six) hours as needed.   Yes [provider]  AMITIZA 24 MCG capsule Take 24 mcg by mouth daily. 07/25/18  Yes [provider]  buPROPion (WELLBUTRIN SR) 150 MG 12 hr tablet Take 150 mg by mouth 2 (two) times daily.   Yes [provider]  Cholecalciferol (VITAMIN D-1000 MAX ST) 1000 units tablet Take 1,000 Units by mouth daily.   Yes [provider]  clonazePAM (KLONOPIN) 1 MG tablet Take 1 mg by mouth daily.   Yes  [provider]  Multiple Vitamin (MULTIVITAMIN) capsule Take 1 capsule by mouth daily.   Yes [provider]  omeprazole (PRILOSEC) 20 MG capsule Take 20 mg by mouth 2 (two) times daily.    Yes [provider]  oxyCODONE (OXY IR/ROXICODONE) 5 MG immediate release tablet Take 5 mg by mouth every 8 (eight) hours as needed for pain. 07/26/18  Yes [provider]  polyethylene glycol (MIRALAX / GLYCOLAX) packet Take 17 g by mouth daily.   Yes [provider]  sertraline (ZOLOFT) 100 MG tablet Take 200 mg by mouth daily.   Yes [provider]  tiZANidine (ZANAFLEX) 2 MG  tablet Take 2 mg by mouth 2 (two) times daily as needed for muscle spasms. 07/23/18  Yes [provider]  zolpidem (AMBIEN CR) 6.25 MG CR tablet Take 6.25 mg by mouth at bedtime.   Yes [provider]    Physical Exam: Vitals:   07/30/18 1545 07/30/18 1615 07/30/18 1700 07/30/18 1800  BP:  110/65 115/81 111/78  Pulse: (!) 45 63 72 (!) 57  Resp: 14 18 16 17   Temp:      TempSrc:      SpO2: 96% 98% 100% 95%    Constitutional: NAD, calm, comfortable Vitals:   07/30/18 1545 07/30/18 1615 07/30/18 1700 07/30/18 1800  BP:  110/65 115/81 111/78  Pulse: (!) 45 63 72 (!) 57  Resp: 14 18 16 17   Temp:      TempSrc:      SpO2: 96% 98% 100% 95%   Eyes: PERRL, lids and conjunctivae normal ENMT: Mucous membranes are moist. Posterior pharynx clear of any exudate or lesions.Normal dentition.  Neck: normal, supple, no masses, no thyromegaly Respiratory: clear to auscultation bilaterally, no wheezing, no crackles. Normal respiratory effort. No accessory muscle use.  Cardiovascular: Regular rate and rhythm, no murmurs / rubs / gallops. No extremity edema. 2+ pedal pulses.  Abdomen: Moderate tenderness- RUQ, epigastric region, with mild guarding, other areas soft, without rebound, no masses palpated. Bowel sounds positive.  Musculoskeletal: no clubbing / cyanosis. No joint deformity upper and lower extremities. Good ROM, no contractures. Normal muscle tone.  Skin: no rashes, lesions, ulcers. No induration Neurologic: CN 2-12 grossly intact. . Strength 5/5 in all 4.  Psychiatric: Normal judgment and insight. Alert and oriented x 3. Normal mood.   Labs on Admission: I have personally reviewed following labs and imaging studies  CBC: Recent Labs  Lab 07/30/18 1117  WBC 4.8  HGB 11.4*  HCT 36.3  MCV 85.6  PLT 369   Basic Metabolic Panel: Recent Labs  Lab 07/30/18 1117  NA 138  K 3.6  CL 100  CO2 26  GLUCOSE 103*  BUN 9  CREATININE 0.92  CALCIUM 9.3   GFR: CrCl  cannot be calculated (Unknown ideal weight.). Liver Function Tests: Recent Labs  Lab 07/30/18 1117  AST 21  ALT 17  ALKPHOS 52  BILITOT 0.9  PROT 7.0  ALBUMIN 4.2   Recent Labs  Lab 07/30/18 1117  LIPASE 27   Urine analysis:    Component Value Date/Time   COLORURINE YELLOW 07/30/2018 1111   APPEARANCEUR CLEAR 07/30/2018 1111   LABSPEC 1.005 07/30/2018 1111   PHURINE 7.0 07/30/2018 1111   GLUCOSEU NEGATIVE 07/30/2018 1111   HGBUR NEGATIVE 07/30/2018 1111   BILIRUBINUR NEGATIVE 07/30/2018 1111   KETONESUR NEGATIVE 07/30/2018 1111   PROTEINUR NEGATIVE 07/30/2018 1111   NITRITE NEGATIVE 07/30/2018 1111   LEUKOCYTESUR NEGATIVE 07/30/2018 1111  Radiological Exams on Admission: US Abdomen Limited  Result Date: 07/30/2018 CLINICAL DATA:  Right upper quadrant pain for the past 2 days. EXAM: ULTRASOUND ABDOMEN LIMITED RIGHT UPPER QUADRANT COMPARISON:  CT abdomen pelvis dated September 23, 2017. FINDINGS: Gallbladder: No gallstones or wall thickening visualized. No sonographic Murphy sign noted by sonographer. Common bile duct: Diameter: 2 mm, normal. Liver: No focal lesion identified. Mildly increased parenchymal echogenicity. Portal vein is patent on color Doppler imaging with normal direction of blood flow towards the liver. IMPRESSION: 1. No acute abnormality. 2. Mild hepatic steatosis. Electronically Signed   By: Obie Dredge M.D.   On: 07/30/2018 13:02   Ct Angio Chest/abd/pel For Dissection W And/or W/wo  Result Date: 07/30/2018 CLINICAL DATA:  Chest and abdominal pain EXAM: CT ANGIOGRAPHY CHEST, ABDOMEN AND PELVIS TECHNIQUE: Initially, axial CT images were obtained through the chest without intravenous contrast material administration. Multidetector CT imaging through the chest, abdomen and pelvis was performed using the standard protocol during bolus administration of intravenous contrast. Multiplanar reconstructed images and MIPs were obtained and reviewed to evaluate the  vascular anatomy. CONTRAST:  ISOVUE-370 IOPAMIDOL (ISOVUE-370) INJECTION 76% COMPARISON:  None. FINDINGS: CTA CHEST FINDINGS Cardiovascular: There is no intramural hematoma in the thoracic aorta on noncontrast enhanced study. There is no appreciable thoracic aortic aneurysm or dissection. Visualized great vessels appear unremarkable. There is no demonstrable pulmonary embolus. No pericardial effusion or pericardial thickening evident. Mediastinum/Nodes: Visualized thyroid appears normal. A small amount of thymic tissue is present, felt to be within normal limits. No adenopathy is appreciable. No esophageal lesions are evident. Lungs/Pleura: No evident pneumothorax. There is no edema or consolidation. No pleural effusion or pleural thickening evident. On axial slice 99 series 9, there is a 4 mm nodular opacity in the lateral segment right middle lobe. Musculoskeletal: No blastic or lytic bone lesions. No evident chest wall lesion. Review of the MIP images confirms the above findings. CTA ABDOMEN AND PELVIS FINDINGS VASCULAR Aorta: There is no abdominal aortic aneurysm or dissection. No appreciable atherosclerotic change noted in the aorta. Celiac: Celiac artery and its branches appear widely patent. No appreciable obstructive disease. No aneurysm or dissection evident. SMA: Superior mesenteric artery and its branches appear widely patent. No appreciable obstructive disease. No aneurysm or dissection evident. Renals: There is a single renal artery on each side. Renal arteries appear patent bilaterally without appreciable obstructive disease. No fibromuscular dysplasia. No aneurysm or dissection in the renal artery distribution on either side. IMA: Inferior mesenteric artery and its branches are patent without appreciable obstructive disease. No aneurysm or dissection evident. Inflow: The major pelvic arterial vessels appear widely patent bilaterally without appreciable obstruction. No aneurysm or dissection  evident. Visualized superficial femoral and profunda femoral arteries patent bilaterally. Veins: No obvious venous abnormality within the limitations of this arterial phase study.1 Review of the MIP images confirms the above findings. NON-VASCULAR Hepatobiliary: No focal liver lesions are evident. Gallbladder wall is not appreciably thickened. There is no biliary duct dilatation. Pancreas: No pancreatic mass or inflammatory focus evident. Spleen: No splenic lesions are evident. Adrenals/Urinary Tract: Adrenals bilaterally appear unremarkable. Kidneys bilaterally show no evident mass or hydronephrosis on either side. No evident renal or ureteral calculus on either side. Urinary bladder is midline with wall thickness within normal limits for degree of urinary bladder distention. Stomach/Bowel: There is moderate stool throughout colon. There is no appreciable bowel wall or mesenteric thickening. There is no evident bowel obstruction. No free air or portal venous air. Lymphatic: No  evident adenopathy in the abdomen or pelvis. Reproductive: Uterus absent.  No pelvic mass evident. Other: Appendix region unremarkable. No abscess or ascites evident in the abdomen or pelvis. Musculoskeletal: There are no evident blastic or lytic bone lesions. There is no intramuscular or abdominal wall lesion evident. Review of the MIP images confirms the above findings. IMPRESSION: CT angiogram chest: 1. No thoracic aortic aneurysm or dissection. No pulmonary embolus evident. 2. 4 mm nodular opacity in the lateral segment right middle lobe. No edema or consolidation. No follow-up needed if patient is low-risk. Non-contrast chest CT can be considered in 12 months if patient is high-risk. This recommendation follows the consensus statement: Guidelines for Management of Incidental Pulmonary Nodules Detected on CT Images: From the Fleischner Society 2017; Radiology 2017; 284:228-243. 3.  No demonstrable thoracic adenopathy. CT angiogram  abdomen; CT angiogram pelvis: 1. The aorta as well as the major mesenteric and pelvic arterial vessels appear widely patent. No obstructive disease evident. No aneurysm or dissection evident in these vessels. No evident fibromuscular dysplasia. 2. No inflammatory foci. No bowel obstruction or bowel wall thickening evident. Appendix region appears normal. No abscess in the abdomen or pelvis. 3. No renal or ureteral calculi appreciable on either side. No hydronephrosis. 4.  Uterus absent. Electronically Signed   By: Bretta Bang III M.D.   On: 07/30/2018 16:02    EKG: None   Assessment/Plan Principal Problem:   Abdominal pain Active Problems:   Anxiety   Depression   Peptic ulcer  Abdominal pain-RUQ and Epigastric, likely secondary to gastritis Vs PUD. Considering history, normal liver enzymes, lipase, negative RUQ abd [place], CTA abd and pelvis. Hgb stable. -GI consult a.m. -Clear liquid diet for now n.p.o. Midnight -Dilaudid for pain (morphine allergy noted) -Trial of GI cocktail -Protonix 40 twice BID - CBC a.m -Patient requesting increased Dilaudid dose from 0.5 mg otherwise fentanyl PCA pump. Dose of dilaudid increased to 1mg  Q4H - UDS  Anxiety, depression-  -Continue home Klonopin sertraline and bupropion  Liver nodule-incidental finding on CTA abdomen, doubt that this is related to patient's abdominal pain. 4 mm nodular opacity in the lateral segment right middle lobe. No edema or consolidation. No follow-up needed if patient is low-risk. Non-contrast chest CT can be considered in 12 months if patient is high-risk. -f/u utpatient  HIV as part of routine health screening  DVT prophylaxis: Scds Code Status: Full Family Communication: Spouse at bedside Disposition Plan: Per rounding team, 1-2 days. Consults called: GI consult in a.m Admission status: Obs, med-surg   Onnie Boer MD [facility] Pager [phone] From 6PM-2AM.  Otherwise please contact  night-coverage [email or web address] Password [facility]  07/30/2018, 7:33 PM

## 2018-07-31 ENCOUNTER — Other Ambulatory Visit: Payer: Self-pay

## 2018-07-31 DIAGNOSIS — K279 Peptic ulcer, site unspecified, unspecified as acute or chronic, without hemorrhage or perforation: Secondary | ICD-10-CM | POA: Diagnosis not present

## 2018-07-31 DIAGNOSIS — F419 Anxiety disorder, unspecified: Secondary | ICD-10-CM

## 2018-07-31 DIAGNOSIS — F329 Major depressive disorder, single episode, unspecified: Secondary | ICD-10-CM | POA: Diagnosis not present

## 2018-07-31 DIAGNOSIS — D509 Iron deficiency anemia, unspecified: Secondary | ICD-10-CM | POA: Diagnosis not present

## 2018-07-31 DIAGNOSIS — R109 Unspecified abdominal pain: Secondary | ICD-10-CM | POA: Diagnosis not present

## 2018-07-31 DIAGNOSIS — R112 Nausea with vomiting, unspecified: Secondary | ICD-10-CM | POA: Diagnosis not present

## 2018-07-31 DIAGNOSIS — R1011 Right upper quadrant pain: Secondary | ICD-10-CM | POA: Diagnosis not present

## 2018-07-31 DIAGNOSIS — K5909 Other constipation: Secondary | ICD-10-CM

## 2018-07-31 LAB — CBC
HCT: 32.4 % — ABNORMAL LOW (ref 36.0–46.0)
Hemoglobin: 10.1 g/dL — ABNORMAL LOW (ref 12.0–15.0)
MCH: 26.9 pg (ref 26.0–34.0)
MCHC: 31.2 g/dL (ref 30.0–36.0)
MCV: 86.4 fL (ref 78.0–100.0)
PLATELETS: 291 10*3/uL (ref 150–400)
RBC: 3.75 MIL/uL — ABNORMAL LOW (ref 3.87–5.11)
RDW: 13.9 % (ref 11.5–15.5)
WBC: 2.9 10*3/uL — AB (ref 4.0–10.5)

## 2018-07-31 LAB — HIV ANTIBODY (ROUTINE TESTING W REFLEX): HIV SCREEN 4TH GENERATION: NONREACTIVE

## 2018-07-31 LAB — COMPREHENSIVE METABOLIC PANEL
ALK PHOS: 45 U/L (ref 38–126)
ALT: 14 U/L (ref 0–44)
ANION GAP: 9 (ref 5–15)
AST: 15 U/L (ref 15–41)
Albumin: 3.5 g/dL (ref 3.5–5.0)
BUN: 7 mg/dL (ref 6–20)
CALCIUM: 8.4 mg/dL — AB (ref 8.9–10.3)
CO2: 27 mmol/L (ref 22–32)
CREATININE: 0.8 mg/dL (ref 0.44–1.00)
Chloride: 105 mmol/L (ref 98–111)
Glucose, Bld: 93 mg/dL (ref 70–99)
Potassium: 4.6 mmol/L (ref 3.5–5.1)
Sodium: 141 mmol/L (ref 135–145)
Total Bilirubin: 1 mg/dL (ref 0.3–1.2)
Total Protein: 5.9 g/dL — ABNORMAL LOW (ref 6.5–8.1)

## 2018-07-31 LAB — RAPID URINE DRUG SCREEN, HOSP PERFORMED
Amphetamines: POSITIVE — AB
BARBITURATES: POSITIVE — AB
Benzodiazepines: POSITIVE — AB
COCAINE: NOT DETECTED
Opiates: POSITIVE — AB
Tetrahydrocannabinol: NOT DETECTED

## 2018-07-31 LAB — FERRITIN: FERRITIN: 7 ng/mL — AB (ref 11–307)

## 2018-07-31 LAB — VITAMIN B12: Vitamin B-12: 466 pg/mL (ref 180–914)

## 2018-07-31 LAB — FOLATE: Folate: 25.7 ng/mL (ref >5.9)

## 2018-07-31 LAB — IRON AND TIBC
IRON: 51 ug/dL (ref 28–170)
SATURATION RATIOS: 12 % (ref 10.4–31.8)
TIBC: 431 ug/dL (ref 250–450)
UIBC: 380 ug/dL

## 2018-07-31 LAB — MAGNESIUM: MAGNESIUM: 2.2 mg/dL (ref 1.7–2.4)

## 2018-07-31 MED ORDER — SODIUM CHLORIDE 0.9 % IV BOLUS
1000.0000 mL | Freq: Once | INTRAVENOUS | Status: AC
  Administered 2018-07-31: 1000 mL via INTRAVENOUS

## 2018-07-31 MED ORDER — PROMETHAZINE HCL 25 MG/ML IJ SOLN
12.5000 mg | Freq: Four times a day (QID) | INTRAMUSCULAR | Status: DC | PRN
  Administered 2018-07-31: 12.5 mg via INTRAVENOUS
  Filled 2018-07-31: qty 1

## 2018-07-31 MED ORDER — SODIUM CHLORIDE 0.9 % IV SOLN
INTRAVENOUS | Status: DC
  Administered 2018-07-31 – 2018-08-01 (×3): via INTRAVENOUS
  Filled 2018-07-31 (×4): qty 1000

## 2018-07-31 NOTE — Progress Notes (Addendum)
PROGRESS NOTE    Meghan Barry  WUJ:[id number] DOB: 1981-02-23 DOA: 07/30/2018 PCP: Lenell Antu, DO    Brief Narrative:  Meghan Barry is a 37 y.o. female with medical history significant for anxiety depression peptic ulcer.  Patient presented to the ED with complaints of abdominal pain right upper quadrant, epigastric radiating to the scapular region with nausea.  No vomiting or bloating.  Patient also noted 3 watery stools the morning of admission.  Patient with history of peptic ulcer disease noted per patient on EGD in 2015 done in [facility] in [place].  Right upper quadrant ultrasound with mild hepatic steatosis.  CT angiogram chest abdomen and pelvis negative for dissection or PE, no abdominal abscess, uterus absent.  4 mm nodular opacity noted in lateral segment of right middle lobe. Due to patient's significant abdominal pain, history of peptic ulcer disease GI consultation placed.     Assessment & Plan:   Principal Problem:   Abdominal pain Active Problems:   Anxiety   Depression   Peptic ulcer  #1 epigastric/right upper quadrant abdominal pain With radiation to the scapular region.  Patient states had bout of bilious emesis this morning which was nonbloody.  Right upper quadrant ultrasound done consistent with mild hepatic steatosis.  CT chest abdomen and pelvis with no significant acute abnormalities to explain patient's abdominal pain.  Discontinue Zofran and placed on IV Phenergan for nausea and emesis.  Continue IV PPI every 12 hours.  Patient currently n.p.o. we will place on clear liquids.  Patient seen in consultation by gastroenterology and patient for upper endoscopy tomorrow.  Patient currently n.p.o. after midnight.  2.  Depression/anxiety Continue home regimen Klonopin, sertraline, bupropion.  3.  Pulmonary nodule 4 mm nodule opacity noted in the lateral segment of the right middle lobe, noncontrast follow-up CT in 1 month if patient is high risk.  4.  History  of peptic ulcer disease IV PPI.  5.  Chronic constipation Continue home regimen Amitiza.  6.  Iron deficiency anemia Status post hysterectomy September 2018.  Patient with no overt bleeding.  Patient for upper endoscopy tomorrow.  May need colonoscopy in the outpatient setting.  Per GI.  7.  Chronic pelvic pain Outpatient follow-up.   DVT prophylaxis: SCD Code Status: Full Family Communication: Updated patient.  No family at bedside. Disposition Plan: Home when clinically improved and per GI.   Consultants:   Gastroenterology: Dr. Chales Abrahams 07/31/2018  Procedures:   CT angiogram chest abdomen and pelvis 07/30/2018  Right upper quadrant ultrasound 07/30/2018  Antimicrobials:   None   Subjective: Patient laying on her side stating she does not feel well.  Patient states she just vomited this morning while GI was in the trying to assess her.  Denies any chest pain.  Complaining of abdominal pain now diffusely with radiation to the back and also to her right neck.  No shortness of breath.  Objective: Vitals:   07/30/18 2000 07/30/18 2030 07/31/18 0041 07/31/18 0430  BP: 109/67 (!) 95/54 113/74 105/69  Pulse: 60 (!) 54 (!) 55 (!) 55  Resp:   16 16  Temp:   97.6 F (36.4 C) 97.8 F (36.6 C)  TempSrc:   Oral Oral  SpO2: 100% 100% 100% 100%    Intake/Output Summary (Last 24 hours) at 07/31/2018 1221 Last data filed at 07/31/2018 1024 Gross per 24 hour  Intake 1290 ml  Output -  Net 1290 ml   There were no vitals filed for this visit.  Examination:  General exam: Appears calm and comfortable  Respiratory system: Clear to auscultation. Respiratory effort normal. Cardiovascular system: S1 & S2 heard, RRR. No JVD, murmurs, rubs, gallops or clicks. No pedal edema. Gastrointestinal system: Abdomen is nondistended, soft and TTP in epigastrium RUQ > lower abdominal region. No organomegaly or masses felt. Normal bowel sounds heard. Central nervous system: Alert and oriented. No  focal neurological deficits. Extremities: Symmetric 5 x 5 power. Skin: No rashes, lesions or ulcers Psychiatry: Judgement and insight appear normal. Mood & affect appropriate.     Data Reviewed: I have personally reviewed following labs and imaging studies  CBC: Recent Labs  Lab 07/30/18 1117 07/31/18 0525  WBC 4.8 2.9*  HGB 11.4* 10.1*  HCT 36.3 32.4*  MCV 85.6 86.4  PLT 369 291   Basic Metabolic Panel: Recent Labs  Lab 07/30/18 1117  NA 138  K 3.6  CL 100  CO2 26  GLUCOSE 103*  BUN 9  CREATININE 0.92  CALCIUM 9.3   GFR: CrCl cannot be calculated (Unknown ideal weight.). Liver Function Tests: Recent Labs  Lab 07/30/18 1117  AST 21  ALT 17  ALKPHOS 52  BILITOT 0.9  PROT 7.0  ALBUMIN 4.2   Recent Labs  Lab 07/30/18 1117  LIPASE 27   No results for input(s): AMMONIA in the last 168 hours. Coagulation Profile: No results for input(s): INR, PROTIME in the last 168 hours. Cardiac Enzymes: No results for input(s): CKTOTAL, CKMB, CKMBINDEX, TROPONINI in the last 168 hours. BNP (last 3 results) No results for input(s): PROBNP in the last 8760 hours. HbA1C: No results for input(s): HGBA1C in the last 72 hours. CBG: No results for input(s): GLUCAP in the last 168 hours. Lipid Profile: No results for input(s): CHOL, HDL, LDLCALC, TRIG, CHOLHDL, LDLDIRECT in the last 72 hours. Thyroid Function Tests: No results for input(s): TSH, T4TOTAL, FREET4, T3FREE, THYROIDAB in the last 72 hours. Anemia Panel: Recent Labs    07/31/18 0948  VITAMINB12 466  FOLATE 25.7  FERRITIN 7*  TIBC 431  IRON 51   Sepsis Labs: No results for input(s): PROCALCITON, LATICACIDVEN in the last 168 hours.  No results found for this or any previous visit (from the past 240 hour(s)).       Radiology Studies: US Abdomen Limited  Result Date: 07/30/2018 CLINICAL DATA:  Right upper quadrant pain for the past 2 days. EXAM: ULTRASOUND ABDOMEN LIMITED RIGHT UPPER QUADRANT  COMPARISON:  CT abdomen pelvis dated September 23, 2017. FINDINGS: Gallbladder: No gallstones or wall thickening visualized. No sonographic Murphy sign noted by sonographer. Common bile duct: Diameter: 2 mm, normal. Liver: No focal lesion identified. Mildly increased parenchymal echogenicity. Portal vein is patent on color Doppler imaging with normal direction of blood flow towards the liver. IMPRESSION: 1. No acute abnormality. 2. Mild hepatic steatosis. Electronically Signed   By: Obie Dredge M.D.   On: 07/30/2018 13:02   Ct Angio Chest/abd/pel For Dissection W And/or W/wo  Result Date: 07/30/2018 CLINICAL DATA:  Chest and abdominal pain EXAM: CT ANGIOGRAPHY CHEST, ABDOMEN AND PELVIS TECHNIQUE: Initially, axial CT images were obtained through the chest without intravenous contrast material administration. Multidetector CT imaging through the chest, abdomen and pelvis was performed using the standard protocol during bolus administration of intravenous contrast. Multiplanar reconstructed images and MIPs were obtained and reviewed to evaluate the vascular anatomy. CONTRAST:  ISOVUE-370 IOPAMIDOL (ISOVUE-370) INJECTION 76% COMPARISON:  None. FINDINGS: CTA CHEST FINDINGS Cardiovascular: There is no intramural hematoma in the thoracic  aorta on noncontrast enhanced study. There is no appreciable thoracic aortic aneurysm or dissection. Visualized great vessels appear unremarkable. There is no demonstrable pulmonary embolus. No pericardial effusion or pericardial thickening evident. Mediastinum/Nodes: Visualized thyroid appears normal. A small amount of thymic tissue is present, felt to be within normal limits. No adenopathy is appreciable. No esophageal lesions are evident. Lungs/Pleura: No evident pneumothorax. There is no edema or consolidation. No pleural effusion or pleural thickening evident. On axial slice 99 series 9, there is a 4 mm nodular opacity in the lateral segment right middle lobe.  Musculoskeletal: No blastic or lytic bone lesions. No evident chest wall lesion. Review of the MIP images confirms the above findings. CTA ABDOMEN AND PELVIS FINDINGS VASCULAR Aorta: There is no abdominal aortic aneurysm or dissection. No appreciable atherosclerotic change noted in the aorta. Celiac: Celiac artery and its branches appear widely patent. No appreciable obstructive disease. No aneurysm or dissection evident. SMA: Superior mesenteric artery and its branches appear widely patent. No appreciable obstructive disease. No aneurysm or dissection evident. Renals: There is a single renal artery on each side. Renal arteries appear patent bilaterally without appreciable obstructive disease. No fibromuscular dysplasia. No aneurysm or dissection in the renal artery distribution on either side. IMA: Inferior mesenteric artery and its branches are patent without appreciable obstructive disease. No aneurysm or dissection evident. Inflow: The major pelvic arterial vessels appear widely patent bilaterally without appreciable obstruction. No aneurysm or dissection evident. Visualized superficial femoral and profunda femoral arteries patent bilaterally. Veins: No obvious venous abnormality within the limitations of this arterial phase study.1 Review of the MIP images confirms the above findings. NON-VASCULAR Hepatobiliary: No focal liver lesions are evident. Gallbladder wall is not appreciably thickened. There is no biliary duct dilatation. Pancreas: No pancreatic mass or inflammatory focus evident. Spleen: No splenic lesions are evident. Adrenals/Urinary Tract: Adrenals bilaterally appear unremarkable. Kidneys bilaterally show no evident mass or hydronephrosis on either side. No evident renal or ureteral calculus on either side. Urinary bladder is midline with wall thickness within normal limits for degree of urinary bladder distention. Stomach/Bowel: There is moderate stool throughout colon. There is no appreciable  bowel wall or mesenteric thickening. There is no evident bowel obstruction. No free air or portal venous air. Lymphatic: No evident adenopathy in the abdomen or pelvis. Reproductive: Uterus absent.  No pelvic mass evident. Other: Appendix region unremarkable. No abscess or ascites evident in the abdomen or pelvis. Musculoskeletal: There are no evident blastic or lytic bone lesions. There is no intramuscular or abdominal wall lesion evident. Review of the MIP images confirms the above findings. IMPRESSION: CT angiogram chest: 1. No thoracic aortic aneurysm or dissection. No pulmonary embolus evident. 2. 4 mm nodular opacity in the lateral segment right middle lobe. No edema or consolidation. No follow-up needed if patient is low-risk. Non-contrast chest CT can be considered in 12 months if patient is high-risk. This recommendation follows the consensus statement: Guidelines for Management of Incidental Pulmonary Nodules Detected on CT Images: From the Fleischner Society 2017; Radiology 2017; 284:228-243. 3.  No demonstrable thoracic adenopathy. CT angiogram abdomen; CT angiogram pelvis: 1. The aorta as well as the major mesenteric and pelvic arterial vessels appear widely patent. No obstructive disease evident. No aneurysm or dissection evident in these vessels. No evident fibromuscular dysplasia. 2. No inflammatory foci. No bowel obstruction or bowel wall thickening evident. Appendix region appears normal. No abscess in the abdomen or pelvis. 3. No renal or ureteral calculi appreciable on either side. No hydronephrosis.  4.  Uterus absent. Electronically Signed   By: Bretta Bang III M.D.   On: 07/30/2018 16:02        Scheduled Meds: . buPROPion  150 mg Oral BID  . clonazePAM  1 mg Oral QHS  . lubiprostone  24 mcg Oral Daily  . pantoprazole (PROTONIX) IV  40 mg Intravenous Q12H  . sertraline  200 mg Oral Daily   Continuous Infusions:   LOS: 0 days    Time spent: 35 minutes    Ramiro Harvest, MD [facility] Pager [phone]  If 7PM-7AM, please contact night-coverage [email or web address] Password [email or web address] 07/31/2018, 12:21 PM

## 2018-07-31 NOTE — Consult Note (Addendum)
Referring Provider: [facility]   Primary Care Physician:  Lenell Antu, DO Primary Gastroenterologist: unassigned  Reason for Consultation:  Nausea, / vomiting and abdominal pain     ASSESSMENT AND PLAN:    1. 37 yo female admitted with acute nausea / vomiting / upper abdominal pain radiating through to back and up into right neck. Labs, U/S, CTA chest and abdomen scan unrevealing. Rule out PUD.  -patient will be scheduled for EGD to be done tomorrow. The risks and benefits of EGD were discussed and the patient agrees to proceed.  -continue anti-emetics. She is vomiting now. I added phenergan -continue IV PPI.   2. Chronic constipation, manages well with Amitiza  3. Iron deficiency anemia. She is s/p hysterectomy in Sept 2018. No overt GI bleeding. -Further evaluation at time of EGD. May need eventual colonoscopy as well (outpatient)  4. Chronic pelvic pain requiring opiods   Attending physician's note   I have taken an interval history, reviewed the chart and examined the patient. I agree with the Advanced Practitioner's note, impression and recommendations.   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  Had normal labs, ultrasound and CTA.  Similar presentation as in 2015, at [facility].  Underwent EGD which showed gastritis with negative extensive work-up.  Patient on narcotics due to chronic pelvic pain after pelvic reconstructive surgery.  Has mild iron deficiency anemia. Plan: IV Protonix, antiemetics including Zofran and Phenergan, pain control, EGD in a.m. CT scan reviewed.  May need colonoscopy as an outpatient.  May also have narcotic induced gastroparesis.  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 She was admitted yesterday with upper abdominal pain , nausea / vomiting.  She has chronic constipation, takes Amitiza which regulates bowels but  yesterday she had a ~ 3 loose BMs. The upper abdominal pain is stabbing, radiates through to her back and upward into her her right chest / neck. Vomiting bilious fluid. Temp ~ 99 at home. In 2015 she had similar sx in [place],  [place]. Says EGD showed gastritis, H.pylori negative. She hasn't taken any NSAIDs since. No melena  In ED her WBC was normal, hgb 11.4, normal MCV. Liver chemistries and lipase normal. Today hgb 10.1, WBC 2.1. Ferritin is 7, TIBC 431.   Patient had "pelvic reconstructive surgery"  Several months ago. She takes pain meds for chronic pelvic pain    Past Medical History:  Diagnosis Date  . Anxiety   . Depression   . Stomach ulcer   . Venous insufficiency     Past Surgical History:  Procedure Laterality Date  . ABDOMINAL HYSTERECTOMY    . anterior posterior vaginal wall repair    . bladder tact    . DILATION AND CURETTAGE OF UTERUS    . left ovary removal    . OVARIAN CYST SURGERY    . TONSILLECTOMY    . WRIST SURGERY      Prior to Admission medications   Medication Sig Start Date End Date Taking? Authorizing Provider  acetaminophen (TYLENOL) 500 MG tablet Take 1,000 mg by mouth every 6 (six) hours as needed.   Yes [provider]  AMITIZA 24 MCG capsule Take 24 mcg by mouth daily. 07/25/18  Yes [provider]  buPROPion (WELLBUTRIN SR) 150 MG 12 hr tablet Take 150 mg by mouth 2 (two) times daily.   Yes  [provider]  Cholecalciferol (VITAMIN D-1000 MAX ST) 1000 units tablet Take 1,000 Units by mouth daily.   Yes [provider]  clonazePAM (KLONOPIN) 1 MG tablet Take 1 mg by mouth daily.   Yes [provider]  Multiple Vitamin (MULTIVITAMIN) capsule Take 1 capsule by mouth daily.   Yes [provider]  omeprazole (PRILOSEC) 20 MG capsule Take 20 mg by mouth 2 (two) times daily.    Yes [provider]  oxyCODONE (OXY IR/ROXICODONE) 5 MG immediate release tablet Take 5 mg by mouth every 8 (eight) hours as  needed for pain. 07/26/18  Yes [provider]  polyethylene glycol (MIRALAX / GLYCOLAX) packet Take 17 g by mouth daily.   Yes [provider]  sertraline (ZOLOFT) 100 MG tablet Take 200 mg by mouth daily.   Yes [provider]  tiZANidine (ZANAFLEX) 2 MG tablet Take 2 mg by mouth 2 (two) times daily as needed for muscle spasms. 07/23/18  Yes [provider]  zolpidem (AMBIEN CR) 6.25 MG CR tablet Take 6.25 mg by mouth at bedtime.   Yes [provider]    Current Facility-Administered Medications  Medication Dose Route Frequency Provider Last Rate Last Dose  . acetaminophen (TYLENOL) tablet 650 mg  650 mg Oral Q6H PRN Emokpae, Ejiroghene E, MD   650 mg at 07/31/18 0443   Or  . acetaminophen (TYLENOL) suppository 650 mg  650 mg Rectal Q6H PRN Emokpae, Ejiroghene E, MD      . buPROPion (WELLBUTRIN SR) 12 hr tablet 150 mg  150 mg Oral BID Emokpae, Ejiroghene E, MD      . clonazePAM (KLONOPIN) tablet 1 mg  1 mg Oral QHS Emokpae, Ejiroghene E, MD      . HYDROmorphone (DILAUDID) injection 1 mg  1 mg Intravenous Q3H PRN Emokpae, Ejiroghene E, MD   1 mg at 07/31/18 0901  . lubiprostone (AMITIZA) capsule 24 mcg  24 mcg Oral Daily Emokpae, Ejiroghene E, MD      . pantoprazole (PROTONIX) injection 40 mg  40 mg Intravenous Q12H Emokpae, Ejiroghene E, MD      . promethazine (PHENERGAN) injection 12.5-25 mg  12.5-25 mg Intravenous Q6H PRN Rodolph Bong, MD      . sertraline (ZOLOFT) tablet 200 mg  200 mg Oral Daily Emokpae, Ejiroghene E, MD   200 mg at 07/30/18 2103  . sodium chloride 0.9 % 1,000 mL infusion   Intravenous Continuous Rodolph Bong, MD      . sodium chloride 0.9 % bolus 1,000 mL  1,000 mL Intravenous Once Rodolph Bong, MD      . zolpidem [facility]) tablet 5 mg  5 mg Oral QHS PRN Emokpae, Ejiroghene E, MD        Allergies as of 07/30/2018 - Review Complete 07/30/2018  Allergen Reaction Noted  . Ciprofloxacin Hives 10/01/2017  .  Bactrim [sulfamethoxazole-trimethoprim] Other (See Comments) 09/27/2017  . Codeine Other (See Comments) 07/30/2018  . Lunesta [eszopiclone] Other (See Comments) 09/23/2017  . Morphine and related Other (See Comments) 09/23/2017  . Amoxicillin Rash 09/23/2017  . Penicillins Rash 09/23/2017    Family History  Problem Relation Age of Onset  . Cancer Sister     Social History   Socioeconomic History  . Marital status: Married    Spouse name: Not on file  . Number of children: Not on file  . Years of education: Not on file  . Highest education level: Not on file  Occupational  History  . Not on file  Social Needs  . Financial resource strain: Not on file  . Food insecurity:    Worry: Not on file    Inability: Not on file  . Transportation needs:    Medical: Not on file    Non-medical: Not on file  Tobacco Use  . Smoking status: Former Smoker    Last attempt to quit: 12/25/2013    Years since quitting: 4.6  . Smokeless tobacco: Never Used  . Tobacco comment: quit smoking 2015  Substance and Sexual Activity  . Alcohol use: No  . Drug use: No  . Sexual activity: Not Currently  Lifestyle  . Physical activity:    Days per week: Not on file    Minutes per session: Not on file  . Stress: Not on file  Relationships  . Social connections:    Talks on phone: Not on file    Gets together: Not on file    Attends religious service: Not on file    Active member of club or organization: Not on file    Attends meetings of clubs or organizations: Not on file    Relationship status: Not on file  . Intimate partner violence:    Fear of current or ex partner: Not on file    Emotionally abused: Not on file    Physically abused: Not on file    Forced sexual activity: Not on file  Other Topics Concern  . Not on file  Social History Narrative  . Not on file    Review of Systems: All systems reviewed and negative except where noted in HPI.  Physical Exam: Vital signs in last 24  hours: Temp:  [97.6 F (36.4 C)-97.8 F (36.6 C)] 97.8 F (36.6 C) (08/07 0430) Pulse Rate:  [45-72] 55 (08/07 0430) Resp:  [14-18] 16 (08/07 0430) BP: (95-122)/(54-81) 105/69 (08/07 0430) SpO2:  [95 %-100 %] 100 % (08/07 0430) Last BM Date: 07/30/18 General:   Alert, well-developed, female wretching and bent over with abdominal pain.  Psych:  Pleasant, cooperative.  Eyes:  Pupils equal, sclera clear, no icterus.   Conjunctiva pink. Ears:  Normal auditory acuity. Nose:  No deformity, discharge,  or lesions. Neck:  Supple; no masses Lungs:  Clear throughout to auscultation.   No wheezes, crackles, or rhonchi.  Heart:  Regular rate and rhythm; no murmurs, no edema Abdomen:  She allowed limited exam because of the abdominal pain. She is significantly tender in epigastrium with even light palpation.    Rectal:  Deferred  Msk:  Symmetrical without gross deformities. . Neurologic:  Alert and  oriented x4;  grossly normal neurologically. Skin:  Intact without significant lesions or rashes..   Intake/Output from previous day: 08/06 0701 - 08/07 0700 In: 1290 [P.O.:240; IV Piggyback:1050] Out: -  Intake/Output this shift: No intake/output data recorded.  Lab Results: Recent Labs    07/30/18 1117 07/31/18 0525  WBC 4.8 2.9*  HGB 11.4* 10.1*  HCT 36.3 32.4*  PLT 369 291   BMET Recent Labs    07/30/18 1117  NA 138  K 3.6  CL 100  CO2 26  GLUCOSE 103*  BUN 9  CREATININE 0.92  CALCIUM 9.3   LFT Recent Labs    07/30/18 1117  PROT 7.0  ALBUMIN 4.2  AST 21  ALT 17  ALKPHOS 52  BILITOT 0.9     Studies/Results: US Abdomen Limited  Result Date: 07/30/2018 CLINICAL DATA:  Right upper quadrant pain for  the past 2 days. EXAM: ULTRASOUND ABDOMEN LIMITED RIGHT UPPER QUADRANT COMPARISON:  CT abdomen pelvis dated September 23, 2017. FINDINGS: Gallbladder: No gallstones or wall thickening visualized. No sonographic Murphy sign noted by sonographer. Common bile duct:  Diameter: 2 mm, normal. Liver: No focal lesion identified. Mildly increased parenchymal echogenicity. Portal vein is patent on color Doppler imaging with normal direction of blood flow towards the liver. IMPRESSION: 1. No acute abnormality. 2. Mild hepatic steatosis. Electronically Signed   By: Obie Dredge M.D.   On: 07/30/2018 13:02   Ct Angio Chest/abd/pel For Dissection W And/or W/wo  Result Date: 07/30/2018 CLINICAL DATA:  Chest and abdominal pain EXAM: CT ANGIOGRAPHY CHEST, ABDOMEN AND PELVIS TECHNIQUE: Initially, axial CT images were obtained through the chest without intravenous contrast material administration. Multidetector CT imaging through the chest, abdomen and pelvis was performed using the standard protocol during bolus administration of intravenous contrast. Multiplanar reconstructed images and MIPs were obtained and reviewed to evaluate the vascular anatomy. CONTRAST:  ISOVUE-370 IOPAMIDOL (ISOVUE-370) INJECTION 76% COMPARISON:  None. FINDINGS: CTA CHEST FINDINGS Cardiovascular: There is no intramural hematoma in the thoracic aorta on noncontrast enhanced study. There is no appreciable thoracic aortic aneurysm or dissection. Visualized great vessels appear unremarkable. There is no demonstrable pulmonary embolus. No pericardial effusion or pericardial thickening evident. Mediastinum/Nodes: Visualized thyroid appears normal. A small amount of thymic tissue is present, felt to be within normal limits. No adenopathy is appreciable. No esophageal lesions are evident. Lungs/Pleura: No evident pneumothorax. There is no edema or consolidation. No pleural effusion or pleural thickening evident. On axial slice 99 series 9, there is a 4 mm nodular opacity in the lateral segment right middle lobe. Musculoskeletal: No blastic or lytic bone lesions. No evident chest wall lesion. Review of the MIP images confirms the above findings. CTA ABDOMEN AND PELVIS FINDINGS VASCULAR Aorta: There is no  abdominal aortic aneurysm or dissection. No appreciable atherosclerotic change noted in the aorta. Celiac: Celiac artery and its branches appear widely patent. No appreciable obstructive disease. No aneurysm or dissection evident. SMA: Superior mesenteric artery and its branches appear widely patent. No appreciable obstructive disease. No aneurysm or dissection evident. Renals: There is a single renal artery on each side. Renal arteries appear patent bilaterally without appreciable obstructive disease. No fibromuscular dysplasia. No aneurysm or dissection in the renal artery distribution on either side. IMA: Inferior mesenteric artery and its branches are patent without appreciable obstructive disease. No aneurysm or dissection evident. Inflow: The major pelvic arterial vessels appear widely patent bilaterally without appreciable obstruction. No aneurysm or dissection evident. Visualized superficial femoral and profunda femoral arteries patent bilaterally. Veins: No obvious venous abnormality within the limitations of this arterial phase study.1 Review of the MIP images confirms the above findings. NON-VASCULAR Hepatobiliary: No focal liver lesions are evident. Gallbladder wall is not appreciably thickened. There is no biliary duct dilatation. Pancreas: No pancreatic mass or inflammatory focus evident. Spleen: No splenic lesions are evident. Adrenals/Urinary Tract: Adrenals bilaterally appear unremarkable. Kidneys bilaterally show no evident mass or hydronephrosis on either side. No evident renal or ureteral calculus on either side. Urinary bladder is midline with wall thickness within normal limits for degree of urinary bladder distention. Stomach/Bowel: There is moderate stool throughout colon. There is no appreciable bowel wall or mesenteric thickening. There is no evident bowel obstruction. No free air or portal venous air. Lymphatic: No evident adenopathy in the abdomen or pelvis. Reproductive: Uterus absent.   No pelvic mass evident. Other: Appendix region  unremarkable. No abscess or ascites evident in the abdomen or pelvis. Musculoskeletal: There are no evident blastic or lytic bone lesions. There is no intramuscular or abdominal wall lesion evident. Review of the MIP images confirms the above findings. IMPRESSION: CT angiogram chest: 1. No thoracic aortic aneurysm or dissection. No pulmonary embolus evident. 2. 4 mm nodular opacity in the lateral segment right middle lobe. No edema or consolidation. No follow-up needed if patient is low-risk. Non-contrast chest CT can be considered in 12 months if patient is high-risk. This recommendation follows the consensus statement: Guidelines for Management of Incidental Pulmonary Nodules Detected on CT Images: From the Fleischner Society 2017; Radiology 2017; 284:228-243. 3.  No demonstrable thoracic adenopathy. CT angiogram abdomen; CT angiogram pelvis: 1. The aorta as well as the major mesenteric and pelvic arterial vessels appear widely patent. No obstructive disease evident. No aneurysm or dissection evident in these vessels. No evident fibromuscular dysplasia. 2. No inflammatory foci. No bowel obstruction or bowel wall thickening evident. Appendix region appears normal. No abscess in the abdomen or pelvis. 3. No renal or ureteral calculi appreciable on either side. No hydronephrosis. 4.  Uterus absent. Electronically Signed   By: Bretta BangWilliam  Woodruff III M.D.   On: 07/30/2018 16:02     Willette ClusterPaula Guenther, NP-C @  07/31/2018, 12:24 PM

## 2018-07-31 NOTE — H&P (View-Only) (Signed)
 Referring Provider: [facility]   Primary Care Physician:  Le, Thao P, DO Primary Gastroenterologist: unassigned  Reason for Consultation:  Nausea, / vomiting and abdominal pain     ASSESSMENT AND PLAN:    1. 37 yo female admitted with acute nausea / vomiting / upper abdominal pain radiating through to back and up into right neck. Labs, U/S, CTA chest and abdomen scan unrevealing. Rule out PUD.  -patient will be scheduled for EGD to be done tomorrow. The risks and benefits of EGD were discussed and the patient agrees to proceed.  -continue anti-emetics. She is vomiting now. I added phenergan -continue IV PPI.   2. Chronic constipation, manages well with Amitiza  3. Iron deficiency anemia. She is s/p hysterectomy in Sept 2018. No overt GI bleeding. -Further evaluation at time of EGD. May need eventual colonoscopy as well (outpatient)  4. Chronic pelvic pain requiring opiods   Attending physician's note   I have taken an interval history, reviewed the chart and examined the patient. I agree with the Advanced Practitioner's note, impression and recommendations.   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  Had normal labs, ultrasound and CTA.  Similar presentation as in 2015, at [facility].  Underwent EGD which showed gastritis with negative extensive work-up.  Patient on narcotics due to chronic pelvic pain after pelvic reconstructive surgery.  Has mild iron deficiency anemia. Plan: IV Protonix, antiemetics including Zofran and Phenergan, pain control, EGD in a.m. CT scan reviewed.  May need colonoscopy as an outpatient.  May also have narcotic induced gastroparesis.  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 She was admitted yesterday with upper abdominal pain , nausea / vomiting.  She has chronic constipation, takes Amitiza which regulates bowels but  yesterday she had a ~ 3 loose BMs. The upper abdominal pain is stabbing, radiates through to her back and upward into her her right chest / neck. Vomiting bilious fluid. Temp ~ 99 at home. In 2015 she had similar sx in [place]. Says EGD showed gastritis, H.pylori negative. She hasn't taken any NSAIDs since. No melena  In ED her WBC was normal, hgb 11.4, normal MCV. Liver chemistries and lipase normal. Today hgb 10.1, WBC 2.1. Ferritin is 7, TIBC 431.   Patient had "pelvic reconstructive surgery"  Several months ago. She takes pain meds for chronic pelvic pain    Past Medical History:  Diagnosis Date  . Anxiety   . Depression   . Stomach ulcer   . Venous insufficiency     Past Surgical History:  Procedure Laterality Date  . ABDOMINAL HYSTERECTOMY    . anterior posterior vaginal wall repair    . bladder tact    . DILATION AND CURETTAGE OF UTERUS    . left ovary removal    . OVARIAN CYST SURGERY    . TONSILLECTOMY    . WRIST SURGERY      Prior to Admission medications   Medication Sig Start Date End Date Taking? Authorizing Provider  acetaminophen (TYLENOL) 500 MG tablet Take 1,000 mg by mouth every 6 (six) hours as needed.   Yes [provider]  AMITIZA 24 MCG capsule Take 24 mcg by mouth daily. 07/25/18  Yes [provider]  buPROPion (WELLBUTRIN SR) 150 MG 12 hr tablet Take 150 mg by mouth 2 (two) times daily.   Yes   [provider]  Cholecalciferol (VITAMIN D-1000 MAX ST) 1000 units tablet Take 1,000 Units by mouth daily.   Yes [provider]  clonazePAM (KLONOPIN) 1 MG tablet Take 1 mg by mouth daily.   Yes [provider]  Multiple Vitamin (MULTIVITAMIN) capsule Take 1 capsule by mouth daily.   Yes [provider]  omeprazole (PRILOSEC) 20 MG capsule Take 20 mg by mouth 2 (two) times daily.    Yes [provider]  oxyCODONE (OXY IR/ROXICODONE) 5 MG immediate release tablet Take 5 mg by mouth every 8 (eight) hours as  needed for pain. 07/26/18  Yes [provider]  polyethylene glycol (MIRALAX / GLYCOLAX) packet Take 17 g by mouth daily.   Yes [provider]  sertraline (ZOLOFT) 100 MG tablet Take 200 mg by mouth daily.   Yes [provider]  tiZANidine (ZANAFLEX) 2 MG tablet Take 2 mg by mouth 2 (two) times daily as needed for muscle spasms. 07/23/18  Yes [provider]  zolpidem (AMBIEN CR) 6.25 MG CR tablet Take 6.25 mg by mouth at bedtime.   Yes [provider]    Current Facility-Administered Medications  Medication Dose Route Frequency Provider Last Rate Last Dose  . acetaminophen (TYLENOL) tablet 650 mg  650 mg Oral Q6H PRN Emokpae, Ejiroghene E, MD   650 mg at 07/31/18 0443   Or  . acetaminophen (TYLENOL) suppository 650 mg  650 mg Rectal Q6H PRN Emokpae, Ejiroghene E, MD      . buPROPion (WELLBUTRIN SR) 12 hr tablet 150 mg  150 mg Oral BID Emokpae, Ejiroghene E, MD      . clonazePAM (KLONOPIN) tablet 1 mg  1 mg Oral QHS Emokpae, Ejiroghene E, MD      . HYDROmorphone (DILAUDID) injection 1 mg  1 mg Intravenous Q3H PRN Emokpae, Ejiroghene E, MD   1 mg at 07/31/18 0901  . lubiprostone (AMITIZA) capsule 24 mcg  24 mcg Oral Daily Emokpae, Ejiroghene E, MD      . pantoprazole (PROTONIX) injection 40 mg  40 mg Intravenous Q12H Emokpae, Ejiroghene E, MD      . promethazine (PHENERGAN) injection 12.5-25 mg  12.5-25 mg Intravenous Q6H PRN Thompson, Daniel V, MD      . sertraline (ZOLOFT) tablet 200 mg  200 mg Oral Daily Emokpae, Ejiroghene E, MD   200 mg at 07/30/18 2103  . sodium chloride 0.9 % 1,000 mL infusion   Intravenous Continuous Thompson, Daniel V, MD      . sodium chloride 0.9 % bolus 1,000 mL  1,000 mL Intravenous Once Thompson, Daniel V, MD      . zolpidem (AMBIEN) tablet 5 mg  5 mg Oral QHS PRN Emokpae, Ejiroghene E, MD        Allergies as of 07/30/2018 - Review Complete 07/30/2018  Allergen Reaction Noted  . Ciprofloxacin Hives 10/01/2017  .  Bactrim [sulfamethoxazole-trimethoprim] Other (See Comments) 09/27/2017  . Codeine Other (See Comments) 07/30/2018  . Lunesta [eszopiclone] Other (See Comments) 09/23/2017  . Morphine and related Other (See Comments) 09/23/2017  . Amoxicillin Rash 09/23/2017  . Penicillins Rash 09/23/2017    Family History  Problem Relation Age of Onset  . Cancer Sister     Social History   Socioeconomic History  . Marital status: Married    Spouse name: Not on file  . Number of children: Not on file  . Years of education: Not on file  . Highest education level: Not on file  Occupational   History  . Not on file  Social Needs  . Financial resource strain: Not on file  . Food insecurity:    Worry: Not on file    Inability: Not on file  . Transportation needs:    Medical: Not on file    Non-medical: Not on file  Tobacco Use  . Smoking status: Former Smoker    Last attempt to quit: 12/25/2013    Years since quitting: 4.6  . Smokeless tobacco: Never Used  . Tobacco comment: quit smoking 2015  Substance and Sexual Activity  . Alcohol use: No  . Drug use: No  . Sexual activity: Not Currently  Lifestyle  . Physical activity:    Days per week: Not on file    Minutes per session: Not on file  . Stress: Not on file  Relationships  . Social connections:    Talks on phone: Not on file    Gets together: Not on file    Attends religious service: Not on file    Active member of club or organization: Not on file    Attends meetings of clubs or organizations: Not on file    Relationship status: Not on file  . Intimate partner violence:    Fear of current or ex partner: Not on file    Emotionally abused: Not on file    Physically abused: Not on file    Forced sexual activity: Not on file  Other Topics Concern  . Not on file  Social History Narrative  . Not on file    Review of Systems: All systems reviewed and negative except where noted in HPI.  Physical Exam: Vital signs in last 24  hours: Temp:  [97.6 F (36.4 C)-97.8 F (36.6 C)] 97.8 F (36.6 C) (08/07 0430) Pulse Rate:  [45-72] 55 (08/07 0430) Resp:  [14-18] 16 (08/07 0430) BP: (95-122)/(54-81) 105/69 (08/07 0430) SpO2:  [95 %-100 %] 100 % (08/07 0430) Last BM Date: 07/30/18 General:   Alert, well-developed, female wretching and bent over with abdominal pain.  Psych:  Pleasant, cooperative.  Eyes:  Pupils equal, sclera clear, no icterus.   Conjunctiva pink. Ears:  Normal auditory acuity. Nose:  No deformity, discharge,  or lesions. Neck:  Supple; no masses Lungs:  Clear throughout to auscultation.   No wheezes, crackles, or rhonchi.  Heart:  Regular rate and rhythm; no murmurs, no edema Abdomen:  She allowed limited exam because of the abdominal pain. She is significantly tender in epigastrium with even light palpation.    Rectal:  Deferred  Msk:  Symmetrical without gross deformities. . Neurologic:  Alert and  oriented x4;  grossly normal neurologically. Skin:  Intact without significant lesions or rashes..   Intake/Output from previous day: 08/06 0701 - 08/07 0700 In: 1290 [P.O.:240; IV Piggyback:1050] Out: -  Intake/Output this shift: No intake/output data recorded.  Lab Results: Recent Labs    07/30/18 1117 07/31/18 0525  WBC 4.8 2.9*  HGB 11.4* 10.1*  HCT 36.3 32.4*  PLT 369 291   BMET Recent Labs    07/30/18 1117  NA 138  K 3.6  CL 100  CO2 26  GLUCOSE 103*  BUN 9  CREATININE 0.92  CALCIUM 9.3   LFT Recent Labs    07/30/18 1117  PROT 7.0  ALBUMIN 4.2  AST 21  ALT 17  ALKPHOS 52  BILITOT 0.9     Studies/Results: Us Abdomen Limited  Result Date: 07/30/2018 CLINICAL DATA:  Right upper quadrant pain for   the past 2 days. EXAM: ULTRASOUND ABDOMEN LIMITED RIGHT UPPER QUADRANT COMPARISON:  CT abdomen pelvis dated September 23, 2017. FINDINGS: Gallbladder: No gallstones or wall thickening visualized. No sonographic Murphy sign noted by sonographer. Common bile duct:  Diameter: 2 mm, normal. Liver: No focal lesion identified. Mildly increased parenchymal echogenicity. Portal vein is patent on color Doppler imaging with normal direction of blood flow towards the liver. IMPRESSION: 1. No acute abnormality. 2. Mild hepatic steatosis. Electronically Signed   By: William T Derry M.D.   On: 07/30/2018 13:02   Ct Angio Chest/abd/pel For Dissection W And/or W/wo  Result Date: 07/30/2018 CLINICAL DATA:  Chest and abdominal pain EXAM: CT ANGIOGRAPHY CHEST, ABDOMEN AND PELVIS TECHNIQUE: Initially, axial CT images were obtained through the chest without intravenous contrast material administration. Multidetector CT imaging through the chest, abdomen and pelvis was performed using the standard protocol during bolus administration of intravenous contrast. Multiplanar reconstructed images and MIPs were obtained and reviewed to evaluate the vascular anatomy. CONTRAST:  100mL ISOVUE-370 IOPAMIDOL (ISOVUE-370) INJECTION 76% COMPARISON:  None. FINDINGS: CTA CHEST FINDINGS Cardiovascular: There is no intramural hematoma in the thoracic aorta on noncontrast enhanced study. There is no appreciable thoracic aortic aneurysm or dissection. Visualized great vessels appear unremarkable. There is no demonstrable pulmonary embolus. No pericardial effusion or pericardial thickening evident. Mediastinum/Nodes: Visualized thyroid appears normal. A small amount of thymic tissue is present, felt to be within normal limits. No adenopathy is appreciable. No esophageal lesions are evident. Lungs/Pleura: No evident pneumothorax. There is no edema or consolidation. No pleural effusion or pleural thickening evident. On axial slice 99 series 9, there is a 4 mm nodular opacity in the lateral segment right middle lobe. Musculoskeletal: No blastic or lytic bone lesions. No evident chest wall lesion. Review of the MIP images confirms the above findings. CTA ABDOMEN AND PELVIS FINDINGS VASCULAR Aorta: There is no  abdominal aortic aneurysm or dissection. No appreciable atherosclerotic change noted in the aorta. Celiac: Celiac artery and its branches appear widely patent. No appreciable obstructive disease. No aneurysm or dissection evident. SMA: Superior mesenteric artery and its branches appear widely patent. No appreciable obstructive disease. No aneurysm or dissection evident. Renals: There is a single renal artery on each side. Renal arteries appear patent bilaterally without appreciable obstructive disease. No fibromuscular dysplasia. No aneurysm or dissection in the renal artery distribution on either side. IMA: Inferior mesenteric artery and its branches are patent without appreciable obstructive disease. No aneurysm or dissection evident. Inflow: The major pelvic arterial vessels appear widely patent bilaterally without appreciable obstruction. No aneurysm or dissection evident. Visualized superficial femoral and profunda femoral arteries patent bilaterally. Veins: No obvious venous abnormality within the limitations of this arterial phase study.1 Review of the MIP images confirms the above findings. NON-VASCULAR Hepatobiliary: No focal liver lesions are evident. Gallbladder wall is not appreciably thickened. There is no biliary duct dilatation. Pancreas: No pancreatic mass or inflammatory focus evident. Spleen: No splenic lesions are evident. Adrenals/Urinary Tract: Adrenals bilaterally appear unremarkable. Kidneys bilaterally show no evident mass or hydronephrosis on either side. No evident renal or ureteral calculus on either side. Urinary bladder is midline with wall thickness within normal limits for degree of urinary bladder distention. Stomach/Bowel: There is moderate stool throughout colon. There is no appreciable bowel wall or mesenteric thickening. There is no evident bowel obstruction. No free air or portal venous air. Lymphatic: No evident adenopathy in the abdomen or pelvis. Reproductive: Uterus absent.   No pelvic mass evident. Other: Appendix region   unremarkable. No abscess or ascites evident in the abdomen or pelvis. Musculoskeletal: There are no evident blastic or lytic bone lesions. There is no intramuscular or abdominal wall lesion evident. Review of the MIP images confirms the above findings. IMPRESSION: CT angiogram chest: 1. No thoracic aortic aneurysm or dissection. No pulmonary embolus evident. 2. 4 mm nodular opacity in the lateral segment right middle lobe. No edema or consolidation. No follow-up needed if patient is low-risk. Non-contrast chest CT can be considered in 12 months if patient is high-risk. This recommendation follows the consensus statement: Guidelines for Management of Incidental Pulmonary Nodules Detected on CT Images: From the Fleischner Society 2017; Radiology 2017; 284:228-243. 3.  No demonstrable thoracic adenopathy. CT angiogram abdomen; CT angiogram pelvis: 1. The aorta as well as the major mesenteric and pelvic arterial vessels appear widely patent. No obstructive disease evident. No aneurysm or dissection evident in these vessels. No evident fibromuscular dysplasia. 2. No inflammatory foci. No bowel obstruction or bowel wall thickening evident. Appendix region appears normal. No abscess in the abdomen or pelvis. 3. No renal or ureteral calculi appreciable on either side. No hydronephrosis. 4.  Uterus absent. Electronically Signed   By: William  Woodruff III M.D.   On: 07/30/2018 16:02     Paula Guenther, NP-C @  07/31/2018, 12:24 PM     

## 2018-08-01 ENCOUNTER — Encounter (HOSPITAL_COMMUNITY)
Admission: EM | Disposition: A | Discharge status: Home / Self Care | Payer: Self-pay | Source: Home / Self Care | Attending: Emergency Medicine

## 2018-08-01 ENCOUNTER — Observation Stay (HOSPITAL_COMMUNITY): Admitting: Certified Registered Nurse Anesthetist

## 2018-08-01 ENCOUNTER — Encounter (HOSPITAL_COMMUNITY): Payer: Self-pay | Admitting: *Deleted

## 2018-08-01 DIAGNOSIS — K2970 Gastritis, unspecified, without bleeding: Secondary | ICD-10-CM | POA: Diagnosis not present

## 2018-08-01 DIAGNOSIS — R1013 Epigastric pain: Secondary | ICD-10-CM

## 2018-08-01 DIAGNOSIS — R109 Unspecified abdominal pain: Secondary | ICD-10-CM | POA: Diagnosis not present

## 2018-08-01 DIAGNOSIS — F419 Anxiety disorder, unspecified: Secondary | ICD-10-CM | POA: Diagnosis not present

## 2018-08-01 DIAGNOSIS — F329 Major depressive disorder, single episode, unspecified: Secondary | ICD-10-CM | POA: Diagnosis not present

## 2018-08-01 DIAGNOSIS — D509 Iron deficiency anemia, unspecified: Secondary | ICD-10-CM

## 2018-08-01 HISTORY — PX: ESOPHAGOGASTRODUODENOSCOPY: SHX5428

## 2018-08-01 HISTORY — PX: BIOPSY: SHX5522

## 2018-08-01 LAB — CBC WITH DIFFERENTIAL/PLATELET
Abs Immature Granulocytes: 0 10*3/uL (ref 0.0–0.1)
Basophils Absolute: 0 10*3/uL (ref 0.0–0.1)
Basophils Relative: 1 %
EOS PCT: 3 %
Eosinophils Absolute: 0.2 10*3/uL (ref 0.0–0.7)
HCT: 35 % — ABNORMAL LOW (ref 36.0–46.0)
Hemoglobin: 10.7 g/dL — ABNORMAL LOW (ref 12.0–15.0)
Immature Granulocytes: 0 %
LYMPHS PCT: 21 %
Lymphs Abs: 1.5 10*3/uL (ref 0.7–4.0)
MCH: 26.8 pg (ref 26.0–34.0)
MCHC: 30.6 g/dL (ref 30.0–36.0)
MCV: 87.5 fL (ref 78.0–100.0)
Monocytes Absolute: 0.7 10*3/uL (ref 0.1–1.0)
Monocytes Relative: 9 %
NEUTROS PCT: 66 %
Neutro Abs: 4.6 10*3/uL (ref 1.7–7.7)
Platelets: 332 10*3/uL (ref 150–400)
RBC: 4 MIL/uL (ref 3.87–5.11)
RDW: 13.9 % (ref 11.5–15.5)
WBC: 7 10*3/uL (ref 4.0–10.5)

## 2018-08-01 LAB — BASIC METABOLIC PANEL
Anion gap: 7 (ref 5–15)
BUN: 6 mg/dL (ref 6–20)
CALCIUM: 8.3 mg/dL — AB (ref 8.9–10.3)
CO2: 24 mmol/L (ref 22–32)
CREATININE: 0.76 mg/dL (ref 0.44–1.00)
Chloride: 108 mmol/L (ref 98–111)
Glucose, Bld: 93 mg/dL (ref 70–99)
Potassium: 4.3 mmol/L (ref 3.5–5.1)
Sodium: 139 mmol/L (ref 135–145)

## 2018-08-01 SURGERY — EGD (ESOPHAGOGASTRODUODENOSCOPY)
Anesthesia: Monitor Anesthesia Care

## 2018-08-01 MED ORDER — PROPOFOL 500 MG/50ML IV EMUL
INTRAVENOUS | Status: DC | PRN
  Administered 2018-08-01: 75 ug/kg/min via INTRAVENOUS

## 2018-08-01 MED ORDER — LACTATED RINGERS IV SOLN
INTRAVENOUS | Status: DC | PRN
  Administered 2018-08-01: 10:00:00 via INTRAVENOUS

## 2018-08-01 MED ORDER — FENTANYL CITRATE (PF) 100 MCG/2ML IJ SOLN
25.0000 ug | INTRAMUSCULAR | Status: DC | PRN
  Administered 2018-08-01 (×3): 25 ug via INTRAVENOUS
  Filled 2018-08-01 (×3): qty 2

## 2018-08-01 MED ORDER — PROPOFOL 10 MG/ML IV BOLUS
INTRAVENOUS | Status: DC | PRN
  Administered 2018-08-01 (×2): 50 mg via INTRAVENOUS

## 2018-08-01 NOTE — Anesthesia Procedure Notes (Signed)
Date/Time: 08/01/2018 11:06 AM Performed by: Rogelia BogaMueller, Lyne Khurana P, CRNA Pre-anesthesia Checklist: Patient identified, Emergency Drugs available, Suction available, Patient being monitored and Timeout performed Patient Re-evaluated:Patient Re-evaluated prior to induction Oxygen Delivery Method: Nasal cannula

## 2018-08-01 NOTE — Transfer of Care (Signed)
Immediate Anesthesia Transfer of Care Note  Patient: Meghan Barry  Procedure(s) Performed: ESOPHAGOGASTRODUODENOSCOPY (EGD) (N/A ) BIOPSY  Patient Location: Endoscopy Unit  Anesthesia Type:MAC  Level of Consciousness: awake, alert , oriented and patient cooperative  Airway & Oxygen Therapy: Patient Spontanous Breathing and Patient connected to nasal cannula oxygen  Post-op Assessment: Report given to RN, Post -op Vital signs reviewed and stable and Patient moving all extremities X 4  Post vital signs: Reviewed and stable  Last Vitals:  Vitals Value Taken Time  BP    Temp    Pulse    Resp    SpO2      Last Pain:  Vitals:   08/01/18 1047  TempSrc: Oral  PainSc: 6       Patients Stated Pain Goal: 2 (85/92/92 4462)  Complications: No apparent anesthesia complications

## 2018-08-01 NOTE — Plan of Care (Signed)
  Problem: Education: Goal: Knowledge of General Education information will improve Description Including pain rating scale, medication(s)/side effects and non-pharmacologic comfort measures Outcome: Progressing Note:  POC reviewed with pt.; aware that she's NPO for EGD this am.

## 2018-08-01 NOTE — Interval H&P Note (Signed)
History and Physical Interval Note:  08/01/2018 10:57 AM  Meghan Barry  has presented today for surgery, with the diagnosis of nausea, vomiting  The various methods of treatment have been discussed with the patient and family. After consideration of risks, benefits and other options for treatment, the patient has consented to  Procedure(s): ESOPHAGOGASTRODUODENOSCOPY (EGD) (N/A) as a surgical intervention .  The patient's history has been reviewed, patient examined, no change in status, stable for surgery.  I have reviewed the patient's chart and labs.  Questions were answered to the patient's satisfaction.     Lynann Bolognaajesh [name]

## 2018-08-01 NOTE — Discharge Summary (Signed)
Physician Discharge Summary  Nolon Bussingrin Banwart JYN:[id number] DOB: August 04, 1981 DOA: 07/30/2018  PCP: Lenell AntuLe, Thao P, DO  Admit date: 07/30/2018 Discharge date: 08/01/2018  Time spent: 60 minutes  Recommendations for Outpatient Follow-up:  1. Follow-up with Le, Thao P, DO in 2 weeks.  On follow-up patient's gastritis will need to be reassessed. 2. Follow-up with Dr. Chales AbrahamsGupta, gastroenterology as needed.  Patient will be called with biopsy results.   Discharge Diagnoses:  Principal Problem:   Abdominal pain Active Problems:   Anxiety   Depression   Peptic ulcer   Gastritis   Discharge Condition: Stable and improved  Diet recommendation: Regular  Filed Weights   08/01/18 1047  Weight: 65.8 kg    History of present illness:  Per Dr Gypsy BalsamEmokpae Lakiesha Jozwiak is a 37 y.o. female with medical history significant for anxiety depression peptic ulcer.  Patient presented to the ED with complaints of abdominal pain right upper quadrant and  epigastric, that started last night.  Abdominal pain radiated to the back (Scapular region) and associated with nausea, no vomiting, or bloating.  Reported 3 watery stools this morning. Patient reported history of peptic ulcer disease in 2015 after EGD was done [facility]( [place] [facility] which showed nonbleeding ulcers and gastritis, with "  With adhesions",  Negative H. pylori and biopsy.  Patient reports compliance with twice daily omeprazole since then.  Denies NSAID use or alcohol intake. No dysuria.  Reports dizziness in the ED.  No chills or fevers.  ED Course: Stable vitals.  WBC 4.8.  Hemoglobin 11.4.  Unremarkable CMP normal liver enzymes. RUQ Us-no acute abnormality mild hepatic steatosis.  With abdominal pain migrating to was epigastric area CT angios chest and abdomen done to rule out dissection-no thoracic aortic aneurysm, no dissection, no PE. 444mm-nodular opacity in the lateral segment right middle lobe.  No abscess.  Uterus absent. Patient was given multiple  doses of fentanyl with persistent pain.  GI cocktail helped relieve some of the pain.  EDP successfully paged GI in the ED, awaiting call back.   Hospital Course:  1 epigastric/right upper quadrant abdominal pain With radiation to the scapular region on admission.  Patient stated had bout of bilious emesis the morning of 07/31/2018 which was nonbloody.    Nausea and emesis improved and had resolved by day of discharge.  Right upper quadrant ultrasound done consistent with mild hepatic steatosis.  CT chest abdomen and pelvis with no significant acute abnormalities to explain patient's abdominal pain.  Discontinued Zofran and placed on IV Phenergan for nausea and emesis.  Patient was placed on the PPI IV every 12 hours during the hospitalization and gastroenterology was consulted.  Patient was made n.p.o. and underwent upper endoscopy on 08/01/2018 which showed a mild gastritis which were biopsied.  Patient was placed back on a full liquid diet which he tolerated.  Patient will be discharged home in stable and improved condition will follow-up with PCP in the outpatient setting.  Biopsies which were done during endoscopy will be called into patient once results are available.  Patient may follow-up with gastroenterology in the outpatient setting as needed.  Patient be discharged home back on home regimen of the PPI.  Patient will be discharged in stable and improved condition.   2.  Depression/anxiety Remained stable during the hospitalization.  Patient maintained on her  home regimen Klonopin, sertraline, bupropion.  3.  Pulmonary nodule 4 mm nodule opacity noted in the lateral segment of the right middle lobe, noncontrast follow-up CT  in 1 month if patient is high risk.  4.  History of peptic ulcer disease Patient was maintained on a PPI during the hospitalization.   5.  Chronic constipation Patient maintained on home regimen Amitiza.  6.  Iron deficiency anemia Status post hysterectomy  September 2018.  Patient with no overt bleeding.  Patient underwent upper endoscopy 08/01/2018 which showed mild gastritis which were biopsied.  Patient maintained on a PPI.  Outpatient follow-up.    7.  Chronic pelvic pain Outpatient follow-up.  8.  Diarrhea Patient with no further loose bowel movements throughout the hospitalization.  C. difficile PCR and enteric precautions were ordered on admission was subsequently discontinued.  Patient remained afebrile with a normal white count.  Outpatient follow-up with PCP.    Procedures:  CT angiogram chest abdomen and pelvis 07/30/2018  Right upper quadrant ultrasound 07/30/2018  Upper endoscopy 08/01/2018--PER Dr Chales Abrahams  Consultations:  Gastroenterology: Dr. Chales Abrahams 07/31/2018  Discharge Exam: Vitals:   08/01/18 1140 08/01/18 1319  BP: (!) 103/46 104/65  Pulse: (!) 48 (!) 58  Resp: 15 17  Temp:  98.2 F (36.8 C)  SpO2: 100% 100%    General: NAD Cardiovascular: RRR Respiratory: CTAB  Discharge Instructions   Discharge Instructions    Diet general   Complete by:  As directed    LIMIT CAFFEINE INTAKE.   Increase activity slowly   Complete by:  As directed      Allergies as of 08/01/2018      Reactions   Ciprofloxacin Hives   flushed face   Bactrim [sulfamethoxazole-trimethoprim] Other (See Comments)   Mouth sores, stiff neck and H/A   Codeine Other (See Comments)   Flushed red face, migraines   Lunesta [eszopiclone] Other (See Comments)   hallucinations   Morphine And Related Other (See Comments)   Severe migraine   Amoxicillin Rash   Penicillins Rash   Childhood reaction      Medication List    TAKE these medications   acetaminophen 500 MG tablet Commonly known as:  TYLENOL Take 1,000 mg by mouth every 6 (six) hours as needed.   AMITIZA 24 MCG capsule Generic drug:  lubiprostone Take 24 mcg by mouth daily.   buPROPion 150 MG 12 hr tablet Commonly known as:  WELLBUTRIN SR Take 150 mg by mouth 2 (two) times  daily.   clonazePAM 1 MG tablet Commonly known as:  KLONOPIN Take 1 mg by mouth daily.   multivitamin capsule Take 1 capsule by mouth daily.   omeprazole 20 MG capsule Commonly known as:  PRILOSEC Take 20 mg by mouth 2 (two) times daily.   oxyCODONE 5 MG immediate release tablet Commonly known as:  Oxy IR/ROXICODONE Take 5 mg by mouth every 8 (eight) hours as needed for pain.   polyethylene glycol packet Commonly known as:  MIRALAX / GLYCOLAX Take 17 g by mouth daily.   sertraline 100 MG tablet Commonly known as:  ZOLOFT Take 200 mg by mouth daily.   tiZANidine 2 MG tablet Commonly known as:  ZANAFLEX Take 2 mg by mouth 2 (two) times daily as needed for muscle spasms.   VITAMIN D-1000 MAX ST 1000 units tablet Generic drug:  Cholecalciferol Take 1,000 Units by mouth daily.   zolpidem 6.25 MG CR tablet Commonly known as:  AMBIEN CR Take 6.25 mg by mouth at bedtime.      Allergies  Allergen Reactions  . Ciprofloxacin Hives    flushed face  . Bactrim [Sulfamethoxazole-Trimethoprim] Other (See Comments)  Mouth sores, stiff neck and H/A  . Codeine Other (See Comments)    Flushed red face, migraines  . Lunesta [Eszopiclone] Other (See Comments)    hallucinations  . Morphine And Related Other (See Comments)    Severe migraine   . Amoxicillin Rash  . Penicillins Rash    Childhood reaction   Follow-up Information    Le, Thao P, DO. Schedule an appointment as soon as possible for a visit in 2 week(s).   Specialty:  Family Medicine Contact information: [address] [place] [address] [place] [address] [place] [address] [phone]        Lynann Bologna, MD Follow up.   Specialties:  Gastroenterology, Internal Medicine Why:  f/u as needed. Contact information: [address] [place] [address] [phone]            The results of significant diagnostics from this hospitalization (including imaging, microbiology, ancillary and laboratory) are listed  below for reference.    Significant Diagnostic Studies: US Abdomen Limited  Result Date: 07/30/2018 CLINICAL DATA:  Right upper quadrant pain for the past 2 days. EXAM: ULTRASOUND ABDOMEN LIMITED RIGHT UPPER QUADRANT COMPARISON:  CT abdomen pelvis dated September 23, 2017. FINDINGS: Gallbladder: No gallstones or wall thickening visualized. No sonographic Murphy sign noted by sonographer. Common bile duct: Diameter: 2 mm, normal. Liver: No focal lesion identified. Mildly increased parenchymal echogenicity. Portal vein is patent on color Doppler imaging with normal direction of blood flow towards the liver. IMPRESSION: 1. No acute abnormality. 2. Mild hepatic steatosis. Electronically Signed   By: Obie Dredge M.D.   On: 07/30/2018 13:02   Ct Angio Chest/abd/pel For Dissection W And/or W/wo  Result Date: 07/30/2018 CLINICAL DATA:  Chest and abdominal pain EXAM: CT ANGIOGRAPHY CHEST, ABDOMEN AND PELVIS TECHNIQUE: Initially, axial CT images were obtained through the chest without intravenous contrast material administration. Multidetector CT imaging through the chest, abdomen and pelvis was performed using the standard protocol during bolus administration of intravenous contrast. Multiplanar reconstructed images and MIPs were obtained and reviewed to evaluate the vascular anatomy. CONTRAST:  ISOVUE-370 IOPAMIDOL (ISOVUE-370) INJECTION 76% COMPARISON:  None. FINDINGS: CTA CHEST FINDINGS Cardiovascular: There is no intramural hematoma in the thoracic aorta on noncontrast enhanced study. There is no appreciable thoracic aortic aneurysm or dissection. Visualized great vessels appear unremarkable. There is no demonstrable pulmonary embolus. No pericardial effusion or pericardial thickening evident. Mediastinum/Nodes: Visualized thyroid appears normal. A small amount of thymic tissue is present, felt to be within normal limits. No adenopathy is appreciable. No esophageal lesions are evident. Lungs/Pleura: No  evident pneumothorax. There is no edema or consolidation. No pleural effusion or pleural thickening evident. On axial slice 99 series 9, there is a 4 mm nodular opacity in the lateral segment right middle lobe. Musculoskeletal: No blastic or lytic bone lesions. No evident chest wall lesion. Review of the MIP images confirms the above findings. CTA ABDOMEN AND PELVIS FINDINGS VASCULAR Aorta: There is no abdominal aortic aneurysm or dissection. No appreciable atherosclerotic change noted in the aorta. Celiac: Celiac artery and its branches appear widely patent. No appreciable obstructive disease. No aneurysm or dissection evident. SMA: Superior mesenteric artery and its branches appear widely patent. No appreciable obstructive disease. No aneurysm or dissection evident. Renals: There is a single renal artery on each side. Renal arteries appear patent bilaterally without appreciable obstructive disease. No fibromuscular dysplasia. No aneurysm or dissection in the renal artery distribution on either side. IMA: Inferior mesenteric artery and its  branches are patent without appreciable obstructive disease. No aneurysm or dissection evident. Inflow: The major pelvic arterial vessels appear widely patent bilaterally without appreciable obstruction. No aneurysm or dissection evident. Visualized superficial femoral and profunda femoral arteries patent bilaterally. Veins: No obvious venous abnormality within the limitations of this arterial phase study.1 Review of the MIP images confirms the above findings. NON-VASCULAR Hepatobiliary: No focal liver lesions are evident. Gallbladder wall is not appreciably thickened. There is no biliary duct dilatation. Pancreas: No pancreatic mass or inflammatory focus evident. Spleen: No splenic lesions are evident. Adrenals/Urinary Tract: Adrenals bilaterally appear unremarkable. Kidneys bilaterally show no evident mass or hydronephrosis on either side. No evident renal or ureteral calculus  on either side. Urinary bladder is midline with wall thickness within normal limits for degree of urinary bladder distention. Stomach/Bowel: There is moderate stool throughout colon. There is no appreciable bowel wall or mesenteric thickening. There is no evident bowel obstruction. No free air or portal venous air. Lymphatic: No evident adenopathy in the abdomen or pelvis. Reproductive: Uterus absent.  No pelvic mass evident. Other: Appendix region unremarkable. No abscess or ascites evident in the abdomen or pelvis. Musculoskeletal: There are no evident blastic or lytic bone lesions. There is no intramuscular or abdominal wall lesion evident. Review of the MIP images confirms the above findings. IMPRESSION: CT angiogram chest: 1. No thoracic aortic aneurysm or dissection. No pulmonary embolus evident. 2. 4 mm nodular opacity in the lateral segment right middle lobe. No edema or consolidation. No follow-up needed if patient is low-risk. Non-contrast chest CT can be considered in 12 months if patient is high-risk. This recommendation follows the consensus statement: Guidelines for Management of Incidental Pulmonary Nodules Detected on CT Images: From the Fleischner Society 2017; Radiology 2017; 284:228-243. 3.  No demonstrable thoracic adenopathy. CT angiogram abdomen; CT angiogram pelvis: 1. The aorta as well as the major mesenteric and pelvic arterial vessels appear widely patent. No obstructive disease evident. No aneurysm or dissection evident in these vessels. No evident fibromuscular dysplasia. 2. No inflammatory foci. No bowel obstruction or bowel wall thickening evident. Appendix region appears normal. No abscess in the abdomen or pelvis. 3. No renal or ureteral calculi appreciable on either side. No hydronephrosis. 4.  Uterus absent. Electronically Signed   By: Bretta Bang III M.D.   On: 07/30/2018 16:02    Microbiology: No results found for this or any previous visit (from the past 240 hour(s)).    Labs: Basic Metabolic Panel: Recent Labs  Lab 07/30/18 1117 07/31/18 1332 08/01/18 0500  NA 138 141 139  K 3.6 4.6 4.3  CL 100 105 108  CO2 26 27 24   GLUCOSE 103* 93 93  BUN 9 7 6   CREATININE 0.92 0.80 0.76  CALCIUM 9.3 8.4* 8.3*  MG  --  2.2  --    Liver Function Tests: Recent Labs  Lab 07/30/18 1117 07/31/18 1332  AST 21 15  ALT 17 14  ALKPHOS 52 45  BILITOT 0.9 1.0  PROT 7.0 5.9*  ALBUMIN 4.2 3.5   Recent Labs  Lab 07/30/18 1117  LIPASE 27   No results for input(s): AMMONIA in the last 168 hours. CBC: Recent Labs  Lab 07/30/18 1117 07/31/18 0525 08/01/18 0500  WBC 4.8 2.9* 7.0  NEUTROABS  --   --  4.6  HGB 11.4* 10.1* 10.7*  HCT 36.3 32.4* 35.0*  MCV 85.6 86.4 87.5  PLT 369 291 332   Cardiac Enzymes: No results for input(s): CKTOTAL, CKMB, CKMBINDEX,  TROPONINI in the last 168 hours. BNP: BNP (last 3 results) No results for input(s): BNP in the last 8760 hours.  ProBNP (last 3 results) No results for input(s): PROBNP in the last 8760 hours.  CBG: No results for input(s): GLUCAP in the last 168 hours.     Signed:  Ramiro Harvest MD.  [facility] 08/01/2018, 4:40 PM

## 2018-08-01 NOTE — Progress Notes (Addendum)
PROGRESS NOTE    Meghan Barry  ZOX:[id number] DOB: [birth year] DOA: 07/30/2018 PCP: Lenell Antu, DO    Brief Narrative:  Meghan Barry is a 37 y.o. female with medical history significant for anxiety depression peptic ulcer.  Patient presented to the ED with complaints of abdominal pain right upper quadrant, epigastric radiating to the scapular region with nausea.  No vomiting or bloating.  Patient also noted 3 watery stools the morning of admission.  Patient with history of peptic ulcer disease noted per patient on EGD in 2015 done in [facility] in [place].  Right upper quadrant ultrasound with mild hepatic steatosis.  CT angiogram chest abdomen and pelvis negative for dissection or PE, no abdominal abscess, uterus absent.  4 mm nodular opacity noted in lateral segment of right middle lobe. Due to patient's significant abdominal pain, history of peptic ulcer disease GI consultation placed.     Assessment & Plan:   Principal Problem:   Abdominal pain Active Problems:   Anxiety   Depression   Peptic ulcer  #1 epigastric/right upper quadrant abdominal pain With radiation to the scapular region on admission.  Patient states had bout of bilious emesis the morning of 07/31/2018 which was nonbloody.  No further nausea or emesis today.  Right upper quadrant ultrasound done consistent with mild hepatic steatosis.  CT chest abdomen and pelvis with no significant acute abnormalities to explain patient's abdominal pain.  Discontinued Zofran and placed on IV Phenergan for nausea and emesis.  Continue IV PPI every 12 hours.  Patient for endoscopy today.  Gastroenterology following and I appreciate the input and recommendations.    2.  Depression/anxiety Stable.  Continue home regimen Klonopin, sertraline, bupropion.  3.  Pulmonary nodule 4 mm nodule opacity noted in the lateral segment of the right middle lobe, noncontrast follow-up CT in 1 month if patient is high risk.  4.  History of peptic ulcer  disease Continue IV PPI.  5.  Chronic constipation Continue home regimen Amitiza.  6.  Iron deficiency anemia Status post hysterectomy September 2018.  Patient with no overt bleeding.  Patient for upper endoscopy today.  H&H stable.  May need colonoscopy in the outpatient setting.  Per GI.  7.  Chronic pelvic pain Outpatient follow-up.  8.  Diarrhea Patient with no further loose bowel movements this morning.  Will discontinue C. difficile PCR.  Discontinue enteric precautions.   DVT prophylaxis: SCD Code Status: Full Family Communication: Updated patient.  No family at bedside. Disposition Plan: Home when clinically improved and per GI.   Consultants:   Gastroenterology: Dr. Chales Abrahams 07/31/2018  Procedures:   CT angiogram chest abdomen and pelvis 07/30/2018  Right upper quadrant ultrasound 07/30/2018  Upper endoscopy pending 08/01/2018  Antimicrobials:   None   Subjective: Patient sitting up in chair on the telephone with her family.  States some improvement with abdominal pain however still with epigastric abdominal pain.  Denies any further nausea or emesis.  No bowel movement since admission.  No diarrhea.  No chest pain.  No shortness of breath.   Objective: Vitals:   07/31/18 0430 07/31/18 1334 07/31/18 2100 08/01/18 0546  BP: 105/69 108/70 101/60 105/65  Pulse: (!) 55 64 61 65  Resp: 16 16 18 18   Temp: 97.8 F (36.6 C) 98.4 F (36.9 C) 98 F (36.7 C) 98 F (36.7 C)  TempSrc: Oral Oral Oral Oral  SpO2: 100% 100% 100% 100%    Intake/Output Summary (Last 24 hours) at 08/01/2018 8119 Last data  filed at 08/01/2018 0700 Gross per 24 hour  Intake 2620.05 ml  Output -  Net 2620.05 ml   There were no vitals filed for this visit.  Examination:  General exam: Appears calm and comfortable  Respiratory system: Lungs clear to auscultation bilaterally.  No wheezes, no crackles, no rhonchi.  Normal respiratory effort.   Cardiovascular system: RRR no murmurs rubs or  gallops.  No JVD.  No lower extremity edema.  Gastrointestinal system: Abdomen is soft, nondistended, tender to palpation epigastrium and right upper quadrant.  Positive bowel movements.  No rebound.  No guarding.  No hepatosplenomegaly.  Central nervous system: Alert and oriented. No focal neurological deficits. Extremities: Symmetric 5 x 5 power. Skin: No rashes, lesions or ulcers Psychiatry: Judgement and insight appear normal. Mood & affect appropriate.     Data Reviewed: I have personally reviewed following labs and imaging studies  CBC: Recent Labs  Lab 07/30/18 1117 07/31/18 0525 08/01/18 0500  WBC 4.8 2.9* 7.0  NEUTROABS  --   --  4.6  HGB 11.4* 10.1* 10.7*  HCT 36.3 32.4* 35.0*  MCV 85.6 86.4 87.5  PLT 369 291 332   Basic Metabolic Panel: Recent Labs  Lab 07/30/18 1117 07/31/18 1332 08/01/18 0500  NA 138 141 139  K 3.6 4.6 4.3  CL 100 105 108  CO2 26 27 24   GLUCOSE 103* 93 93  BUN 9 7 6   CREATININE 0.92 0.80 0.76  CALCIUM 9.3 8.4* 8.3*  MG  --  2.2  --    GFR: CrCl cannot be calculated (Unknown ideal weight.). Liver Function Tests: Recent Labs  Lab 07/30/18 1117 07/31/18 1332  AST 21 15  ALT 17 14  ALKPHOS 52 45  BILITOT 0.9 1.0  PROT 7.0 5.9*  ALBUMIN 4.2 3.5   Recent Labs  Lab 07/30/18 1117  LIPASE 27   No results for input(s): AMMONIA in the last 168 hours. Coagulation Profile: No results for input(s): INR, PROTIME in the last 168 hours. Cardiac Enzymes: No results for input(s): CKTOTAL, CKMB, CKMBINDEX, TROPONINI in the last 168 hours. BNP (last 3 results) No results for input(s): PROBNP in the last 8760 hours. HbA1C: No results for input(s): HGBA1C in the last 72 hours. CBG: No results for input(s): GLUCAP in the last 168 hours. Lipid Profile: No results for input(s): CHOL, HDL, LDLCALC, TRIG, CHOLHDL, LDLDIRECT in the last 72 hours. Thyroid Function Tests: No results for input(s): TSH, T4TOTAL, FREET4, T3FREE, THYROIDAB in the  last 72 hours. Anemia Panel: Recent Labs    07/31/18 0948  VITAMINB12 466  FOLATE 25.7  FERRITIN 7*  TIBC 431  IRON 51   Sepsis Labs: No results for input(s): PROCALCITON, LATICACIDVEN in the last 168 hours.  No results found for this or any previous visit (from the past 240 hour(s)).       Radiology Studies: [place] Abdomen Limited  Result Date: 07/30/2018 CLINICAL DATA:  Right upper quadrant pain for the past 2 days. EXAM: ULTRASOUND ABDOMEN LIMITED RIGHT UPPER QUADRANT COMPARISON:  CT abdomen pelvis dated September 23, 2017. FINDINGS: Gallbladder: No gallstones or wall thickening visualized. No sonographic Murphy sign noted by sonographer. Common bile duct: Diameter: 2 mm, normal. Liver: No focal lesion identified. Mildly increased parenchymal echogenicity. Portal vein is patent on color Doppler imaging with normal direction of blood flow towards the liver. IMPRESSION: 1. No acute abnormality. 2. Mild hepatic steatosis. Electronically Signed   By: Obie DredgeWilliam T Derry M.D.   On: 07/30/2018 13:02   Ct  Angio Chest/abd/pel For Dissection W And/or W/wo  Result Date: 07/30/2018 CLINICAL DATA:  Chest and abdominal pain EXAM: CT ANGIOGRAPHY CHEST, ABDOMEN AND PELVIS TECHNIQUE: Initially, axial CT images were obtained through the chest without intravenous contrast material administration. Multidetector CT imaging through the chest, abdomen and pelvis was performed using the standard protocol during bolus administration of intravenous contrast. Multiplanar reconstructed images and MIPs were obtained and reviewed to evaluate the vascular anatomy. CONTRAST:  ISOVUE-370 IOPAMIDOL (ISOVUE-370) INJECTION 76% COMPARISON:  None. FINDINGS: CTA CHEST FINDINGS Cardiovascular: There is no intramural hematoma in the thoracic aorta on noncontrast enhanced study. There is no appreciable thoracic aortic aneurysm or dissection. Visualized great vessels appear unremarkable. There is no demonstrable pulmonary embolus.  No pericardial effusion or pericardial thickening evident. Mediastinum/Nodes: Visualized thyroid appears normal. A small amount of thymic tissue is present, felt to be within normal limits. No adenopathy is appreciable. No esophageal lesions are evident. Lungs/Pleura: No evident pneumothorax. There is no edema or consolidation. No pleural effusion or pleural thickening evident. On axial slice 99 series 9, there is a 4 mm nodular opacity in the lateral segment right middle lobe. Musculoskeletal: No blastic or lytic bone lesions. No evident chest wall lesion. Review of the MIP images confirms the above findings. CTA ABDOMEN AND PELVIS FINDINGS VASCULAR Aorta: There is no abdominal aortic aneurysm or dissection. No appreciable atherosclerotic change noted in the aorta. Celiac: Celiac artery and its branches appear widely patent. No appreciable obstructive disease. No aneurysm or dissection evident. SMA: Superior mesenteric artery and its branches appear widely patent. No appreciable obstructive disease. No aneurysm or dissection evident. Renals: There is a single renal artery on each side. Renal arteries appear patent bilaterally without appreciable obstructive disease. No fibromuscular dysplasia. No aneurysm or dissection in the renal artery distribution on either side. IMA: Inferior mesenteric artery and its branches are patent without appreciable obstructive disease. No aneurysm or dissection evident. Inflow: The major pelvic arterial vessels appear widely patent bilaterally without appreciable obstruction. No aneurysm or dissection evident. Visualized superficial femoral and profunda femoral arteries patent bilaterally. Veins: No obvious venous abnormality within the limitations of this arterial phase study.1 Review of the MIP images confirms the above findings. NON-VASCULAR Hepatobiliary: No focal liver lesions are evident. Gallbladder wall is not appreciably thickened. There is no biliary duct dilatation.  Pancreas: No pancreatic mass or inflammatory focus evident. Spleen: No splenic lesions are evident. Adrenals/Urinary Tract: Adrenals bilaterally appear unremarkable. Kidneys bilaterally show no evident mass or hydronephrosis on either side. No evident renal or ureteral calculus on either side. Urinary bladder is midline with wall thickness within normal limits for degree of urinary bladder distention. Stomach/Bowel: There is moderate stool throughout colon. There is no appreciable bowel wall or mesenteric thickening. There is no evident bowel obstruction. No free air or portal venous air. Lymphatic: No evident adenopathy in the abdomen or pelvis. Reproductive: Uterus absent.  No pelvic mass evident. Other: Appendix region unremarkable. No abscess or ascites evident in the abdomen or pelvis. Musculoskeletal: There are no evident blastic or lytic bone lesions. There is no intramuscular or abdominal wall lesion evident. Review of the MIP images confirms the above findings. IMPRESSION: CT angiogram chest: 1. No thoracic aortic aneurysm or dissection. No pulmonary embolus evident. 2. 4 mm nodular opacity in the lateral segment right middle lobe. No edema or consolidation. No follow-up needed if patient is low-risk. Non-contrast chest CT can be considered in 12 months if patient is high-risk. This recommendation follows the consensus  statement: Guidelines for Management of Incidental Pulmonary Nodules Detected on CT Images: From the Fleischner Society 2017; Radiology 2017; 284:228-243. 3.  No demonstrable thoracic adenopathy. CT angiogram abdomen; CT angiogram pelvis: 1. The aorta as well as the major mesenteric and pelvic arterial vessels appear widely patent. No obstructive disease evident. No aneurysm or dissection evident in these vessels. No evident fibromuscular dysplasia. 2. No inflammatory foci. No bowel obstruction or bowel wall thickening evident. Appendix region appears normal. No abscess in the abdomen or  pelvis. 3. No renal or ureteral calculi appreciable on either side. No hydronephrosis. 4.  Uterus absent. Electronically Signed   By: Bretta Bang III M.D.   On: 07/30/2018 16:02        Scheduled Meds: . buPROPion  150 mg Oral BID  . clonazePAM  1 mg Oral QHS  . lubiprostone  24 mcg Oral Daily  . pantoprazole (PROTONIX) IV  40 mg Intravenous Q12H  . sertraline  200 mg Oral Daily   Continuous Infusions: . sodium chloride 0.9 % 1,000 mL infusion 125 mL/hr at 08/01/18 0454     LOS: 0 days    Time spent: 35 minutes    Ramiro Harvest, MD [facility] Pager [phone]  If 7PM-7AM, please contact night-coverage [email or web address] Password [facility] 08/01/2018, 9:42 AM

## 2018-08-01 NOTE — Op Note (Signed)
[facility] Name: Meghan Barry Procedure Date : 08/01/2018 MRN: [id number] Attending MD: Lynann Bolognaajesh Forrest Demuro , MD Date of Birth: 19-Nov-1981 CSN: [id number] Age: 37 Admit Type: Inpatient Procedure:                Upper GI endoscopy Indications:              Persistent vomiting of unknown cause, epigastric                            pain with negative US and CTA Providers:                Lynann Bolognaajesh Kynlei Piontek, MD, Tomma RakersJennifer Kappus, RN, Beryle BeamsJanie                            Billups, Technician, Kirt Boyshomas Mueller, CRNA Referring MD:              Medicines:                Monitored Anesthesia Care Complications:            No immediate complications. Estimated Blood Loss:     Estimated blood loss: none. Procedure:                Pre-Anesthesia Assessment:                           - Prior to the procedure, a History and Physical                            was performed, and patient medications and                            allergies were reviewed. The patient's tolerance of                            previous anesthesia was also reviewed. The risks                            and benefits of the procedure and the sedation                            options and risks were discussed with the patient.                            All questions were answered, and informed consent                            was obtained. Prior Anticoagulants: The patient has                            taken no previous anticoagulant or antiplatelet                            agents. ASA Grade Assessment: II - A patient with  mild systemic disease. After reviewing the risks                            and benefits, the patient was deemed in                            satisfactory condition to undergo the procedure.                           After obtaining informed consent, the endoscope was                            passed under direct vision. Throughout the                             procedure, the patient's blood pressure, pulse, and                            oxygen saturations were monitored continuously. The                            GIF-H190 ([id number] Olympus adult EGD was introduced                            through the mouth, and advanced to the second part                            of duodenum. The upper GI endoscopy was                            accomplished without difficulty. The patient                            tolerated the procedure well. Scope In: Scope Out: Findings:      Localized mild inflammation characterized by erythema was found in the       gastric antrum. Biopsies were taken with a cold forceps for histology.       Estimated blood loss: none. Biopsies for histology were taken with a       cold forceps for evaluation of celiac disease. Estimated blood loss:       none.      The exam was otherwise without abnormality. Impression:               - Mild gastritis. Biopsied.                           - The examination was otherwise normal. Recommendation:           - Return patient to hospital ward for ongoing care.                           - Full liquid diet.                           - Await pathology results.                           -  continue Protonix 40 mg by mouth once a day.                           - Return to GI clinic in 4 weeks. Procedure Code(s):        --- Professional ---                           [phone], Esophagogastroduodenoscopy, flexible,                            transoral; with biopsy, single or multiple Diagnosis Code(s):        --- Professional ---                           K29.70, Gastritis, unspecified, without bleeding                           R11.10, Vomiting, unspecified CPT copyright 2017 American Medical Association. All rights reserved. The codes documented in this report are preliminary and upon coder review may  be revised to meet current compliance requirements. Lynann Bologna, MD 08/01/2018 11:23:19  AM This report has been signed electronically. Number of Addenda: 0

## 2018-08-01 NOTE — Plan of Care (Signed)

## 2018-08-01 NOTE — Progress Notes (Addendum)
Pt. wants Fentanyl instead of Dilaudid- doesn't like the way Dilaudid makes her feel ; Kirby,NP paged.

## 2018-08-01 NOTE — Anesthesia Preprocedure Evaluation (Addendum)
Anesthesia Evaluation  Patient identified by MRN, date of birth, ID band Patient awake    Reviewed: Allergy & Precautions, H&P , NPO status , Patient's Chart, lab work & pertinent test results  Airway Mallampati: II  TM Distance: >3 FB Neck ROM: Full    Dental no notable dental hx. (+) Teeth Intact, Dental Advisory Given   Pulmonary neg pulmonary ROS, former smoker,    Pulmonary exam normal breath sounds clear to auscultation       Cardiovascular negative cardio ROS   Rhythm:Regular Rate:Normal     Neuro/Psych Anxiety Depression negative neurological ROS     GI/Hepatic Neg liver ROS, PUD,   Endo/Other  negative endocrine ROS  Renal/GU negative Renal ROS  negative genitourinary   Musculoskeletal   Abdominal   Peds  Hematology negative hematology ROS (+)   Anesthesia Other Findings   Reproductive/Obstetrics negative OB ROS                            Anesthesia Physical Anesthesia Plan  ASA: II  Anesthesia Plan: MAC   Post-op Pain Management:    Induction: Intravenous  PONV Risk Score and Plan: 2 and Propofol infusion and Treatment may vary due to age or medical condition  Airway Management Planned: Nasal Cannula  Additional Equipment:   Intra-op Plan:   Post-operative Plan:   Informed Consent: I have reviewed the patients History and Physical, chart, labs and discussed the procedure including the risks, benefits and alternatives for the proposed anesthesia with the patient or authorized representative who has indicated his/her understanding and acceptance.   Dental advisory given  Plan Discussed with: CRNA, Anesthesiologist and Surgeon  Anesthesia Plan Comments:        Anesthesia Quick Evaluation

## 2018-08-01 NOTE — Anesthesia Postprocedure Evaluation (Signed)
Anesthesia Post Note  Patient: Meghan Barry  Procedure(s) Performed: ESOPHAGOGASTRODUODENOSCOPY (EGD) (N/A ) BIOPSY     Patient location during evaluation: PACU Anesthesia Type: MAC Level of consciousness: awake and alert Pain management: pain level controlled Vital Signs Assessment: post-procedure vital signs reviewed and stable Respiratory status: spontaneous breathing, nonlabored ventilation and respiratory function stable Cardiovascular status: stable and blood pressure returned to baseline Postop Assessment: no apparent nausea or vomiting Anesthetic complications: no    Last Vitals:  Vitals:   08/01/18 1130 08/01/18 1140  BP: (!) 93/49 (!) 103/46  Pulse: (!) 48 (!) 48  Resp: 17 15  Temp:    SpO2: 100% 100%    Last Pain:  Vitals:   08/01/18 1140  TempSrc:   PainSc: 0-No pain                 Deshon Hsiao,W. EDMOND

## 2018-08-07 ENCOUNTER — Encounter: Payer: Self-pay | Admitting: Gastroenterology

## 2018-09-04 ENCOUNTER — Other Ambulatory Visit (HOSPITAL_COMMUNITY): Payer: Self-pay | Admitting: Gastroenterology

## 2018-09-04 DIAGNOSIS — R1011 Right upper quadrant pain: Secondary | ICD-10-CM

## 2018-09-10 ENCOUNTER — Ambulatory Visit (HOSPITAL_COMMUNITY)
Admission: RE | Admit: 2018-09-10 | Discharge: 2018-09-10 | Disposition: A | Discharge status: Home / Self Care | Source: Ambulatory Visit | Attending: Gastroenterology | Admitting: Gastroenterology

## 2018-09-10 DIAGNOSIS — R1011 Right upper quadrant pain: Secondary | ICD-10-CM | POA: Insufficient documentation

## 2018-09-10 MED ORDER — TECHNETIUM TC 99M MEBROFENIN IV KIT
5.0000 | PACK | Freq: Once | INTRAVENOUS | Status: AC | PRN
  Administered 2018-09-10: 5 via INTRAVENOUS

## 2018-10-02 ENCOUNTER — Encounter (HOSPITAL_COMMUNITY): Payer: Self-pay | Admitting: Emergency Medicine

## 2018-10-02 ENCOUNTER — Emergency Department (HOSPITAL_COMMUNITY)

## 2018-10-02 ENCOUNTER — Observation Stay (HOSPITAL_COMMUNITY)
Admission: EM | Admit: 2018-10-02 | Discharge: 2018-10-04 | Disposition: A | Discharge status: Home / Self Care | Attending: General Surgery | Admitting: General Surgery

## 2018-10-02 ENCOUNTER — Other Ambulatory Visit: Payer: Self-pay

## 2018-10-02 DIAGNOSIS — K8020 Calculus of gallbladder without cholecystitis without obstruction: Secondary | ICD-10-CM

## 2018-10-02 DIAGNOSIS — Z885 Allergy status to narcotic agent status: Secondary | ICD-10-CM | POA: Insufficient documentation

## 2018-10-02 DIAGNOSIS — R1011 Right upper quadrant pain: Secondary | ICD-10-CM | POA: Diagnosis present

## 2018-10-02 DIAGNOSIS — R112 Nausea with vomiting, unspecified: Secondary | ICD-10-CM | POA: Diagnosis present

## 2018-10-02 DIAGNOSIS — Z882 Allergy status to sulfonamides status: Secondary | ICD-10-CM | POA: Diagnosis not present

## 2018-10-02 DIAGNOSIS — Z87891 Personal history of nicotine dependence: Secondary | ICD-10-CM | POA: Diagnosis not present

## 2018-10-02 DIAGNOSIS — F419 Anxiety disorder, unspecified: Secondary | ICD-10-CM | POA: Diagnosis not present

## 2018-10-02 DIAGNOSIS — K811 Chronic cholecystitis: Secondary | ICD-10-CM | POA: Diagnosis not present

## 2018-10-02 DIAGNOSIS — Z90710 Acquired absence of both cervix and uterus: Secondary | ICD-10-CM | POA: Diagnosis not present

## 2018-10-02 DIAGNOSIS — K259 Gastric ulcer, unspecified as acute or chronic, without hemorrhage or perforation: Secondary | ICD-10-CM | POA: Insufficient documentation

## 2018-10-02 DIAGNOSIS — K819 Cholecystitis, unspecified: Secondary | ICD-10-CM | POA: Diagnosis present

## 2018-10-02 DIAGNOSIS — F329 Major depressive disorder, single episode, unspecified: Secondary | ICD-10-CM | POA: Diagnosis not present

## 2018-10-02 LAB — CBC WITH DIFFERENTIAL/PLATELET
ABS IMMATURE GRANULOCYTES: 0.06 10*3/uL (ref 0.00–0.07)
Basophils Absolute: 0 10*3/uL (ref 0.0–0.1)
Basophils Relative: 0 %
EOS PCT: 0 %
Eosinophils Absolute: 0 10*3/uL (ref 0.0–0.5)
HCT: 38.2 % (ref 36.0–46.0)
HEMOGLOBIN: 12 g/dL (ref 12.0–15.0)
Immature Granulocytes: 1 %
LYMPHS PCT: 7 %
Lymphs Abs: 0.9 10*3/uL (ref 0.7–4.0)
MCH: 26.2 pg (ref 26.0–34.0)
MCHC: 31.4 g/dL (ref 30.0–36.0)
MCV: 83.4 fL (ref 80.0–100.0)
MONO ABS: 1 10*3/uL (ref 0.1–1.0)
MONOS PCT: 8 %
NEUTROS ABS: 10.9 10*3/uL — AB (ref 1.7–7.7)
Neutrophils Relative %: 84 %
Platelets: 305 10*3/uL (ref 150–400)
RBC: 4.58 MIL/uL (ref 3.87–5.11)
RDW: 15.3 % (ref 11.5–15.5)
WBC: 12.8 10*3/uL — AB (ref 4.0–10.5)
nRBC: 0 % (ref 0.0–0.2)

## 2018-10-02 LAB — COMPREHENSIVE METABOLIC PANEL
ALBUMIN: 4.8 g/dL (ref 3.5–5.0)
ALT: 18 U/L (ref 0–44)
AST: 29 U/L (ref 15–41)
Alkaline Phosphatase: 55 U/L (ref 38–126)
Anion gap: 13 (ref 5–15)
BILIRUBIN TOTAL: 1.6 mg/dL — AB (ref 0.3–1.2)
BUN: 12 mg/dL (ref 6–20)
CHLORIDE: 102 mmol/L (ref 98–111)
CO2: 22 mmol/L (ref 22–32)
CREATININE: 0.89 mg/dL (ref 0.44–1.00)
Calcium: 9.8 mg/dL (ref 8.9–10.3)
GFR calc Af Amer: >60 mL/min (ref >60)
GLUCOSE: 95 mg/dL (ref 70–99)
POTASSIUM: 4.1 mmol/L (ref 3.5–5.1)
Sodium: 137 mmol/L (ref 135–145)
Total Protein: 8.4 g/dL — ABNORMAL HIGH (ref 6.5–8.1)

## 2018-10-02 LAB — I-STAT TROPONIN, ED: Troponin i, poc: 0 ng/mL (ref 0.00–0.08)

## 2018-10-02 LAB — LIPASE, BLOOD: LIPASE: 33 U/L (ref 11–51)

## 2018-10-02 MED ORDER — KETOROLAC TROMETHAMINE 15 MG/ML IJ SOLN: 15.0000 mg | Freq: Four times a day (QID) | INTRAMUSCULAR | Status: DC

## 2018-10-02 MED ORDER — METHOCARBAMOL 500 MG PO TABS
500.0000 mg | ORAL_TABLET | Freq: Four times a day (QID) | ORAL | Status: DC | PRN
  Administered 2018-10-02 – 2018-10-04 (×3): 500 mg via ORAL
  Filled 2018-10-02 (×3): qty 1

## 2018-10-02 MED ORDER — ONDANSETRON HCL 4 MG/2ML IJ SOLN
4.0000 mg | Freq: Four times a day (QID) | INTRAMUSCULAR | Status: DC | PRN
  Administered 2018-10-02 – 2018-10-03 (×2): 4 mg via INTRAVENOUS
  Filled 2018-10-02 (×2): qty 2

## 2018-10-02 MED ORDER — ONDANSETRON HCL 4 MG/2ML IJ SOLN
4.0000 mg | Freq: Once | INTRAMUSCULAR | Status: AC
  Administered 2018-10-02: 4 mg via INTRAVENOUS
  Filled 2018-10-02: qty 2

## 2018-10-02 MED ORDER — SODIUM CHLORIDE 0.9 % IV BOLUS
1000.0000 mL | Freq: Once | INTRAVENOUS | Status: AC
Start: 2018-10-02 — End: 2018-10-02
  Administered 2018-10-02: 1000 mL via INTRAVENOUS

## 2018-10-02 MED ORDER — FENTANYL CITRATE (PF) 100 MCG/2ML IJ SOLN
50.0000 ug | Freq: Once | INTRAMUSCULAR | Status: AC
  Administered 2018-10-02: 50 ug via INTRAVENOUS
  Filled 2018-10-02: qty 2

## 2018-10-02 MED ORDER — PROCHLORPERAZINE EDISYLATE 10 MG/2ML IJ SOLN: 5.0000 mg | Freq: Four times a day (QID) | INTRAMUSCULAR | Status: DC | PRN

## 2018-10-02 MED ORDER — ONDANSETRON 4 MG PO TBDP: 4.0000 mg | ORAL_TABLET | Freq: Four times a day (QID) | ORAL | Status: DC | PRN

## 2018-10-02 MED ORDER — KETOROLAC TROMETHAMINE 15 MG/ML IJ SOLN
15.0000 mg | Freq: Four times a day (QID) | INTRAMUSCULAR | Status: DC | PRN
  Administered 2018-10-02: 15 mg via INTRAVENOUS
  Filled 2018-10-02: qty 1

## 2018-10-02 MED ORDER — OXYCODONE HCL 5 MG PO TABS
5.0000 mg | ORAL_TABLET | ORAL | Status: DC | PRN
  Administered 2018-10-02 (×2): 5 mg via ORAL
  Administered 2018-10-03 – 2018-10-04 (×5): 10 mg via ORAL
  Filled 2018-10-02: qty 1
  Filled 2018-10-02 (×6): qty 2
  Filled 2018-10-02: qty 1

## 2018-10-02 MED ORDER — ACETAMINOPHEN 325 MG PO TABS
650.0000 mg | ORAL_TABLET | Freq: Four times a day (QID) | ORAL | Status: DC
  Administered 2018-10-02 – 2018-10-04 (×6): 650 mg via ORAL
  Filled 2018-10-02 (×6): qty 2

## 2018-10-02 MED ORDER — POTASSIUM CL IN DEXTROSE 5% 20 MEQ/L IV SOLN
20.0000 meq | INTRAVENOUS | Status: DC
  Administered 2018-10-02 – 2018-10-03 (×2): 20 meq via INTRAVENOUS
  Filled 2018-10-02 (×3): qty 1000

## 2018-10-02 MED ORDER — PROCHLORPERAZINE MALEATE 10 MG PO TABS: 10.0000 mg | ORAL_TABLET | Freq: Four times a day (QID) | ORAL | Status: DC | PRN

## 2018-10-02 MED ORDER — FENTANYL CITRATE (PF) 100 MCG/2ML IJ SOLN
25.0000 ug | INTRAMUSCULAR | Status: DC | PRN
  Administered 2018-10-02 – 2018-10-03 (×8): 50 ug via INTRAVENOUS
  Filled 2018-10-02 (×8): qty 2

## 2018-10-02 MED ORDER — KETOROLAC TROMETHAMINE 15 MG/ML IJ SOLN: 15.0000 mg | Freq: Four times a day (QID) | INTRAMUSCULAR | Status: DC | PRN

## 2018-10-02 MED ORDER — DIPHENHYDRAMINE HCL 50 MG/ML IJ SOLN: 25.0000 mg | Freq: Four times a day (QID) | INTRAMUSCULAR | Status: DC | PRN

## 2018-10-02 MED ORDER — SODIUM CHLORIDE 0.9 % IV SOLN
1.0000 g | Freq: Three times a day (TID) | INTRAVENOUS | Status: DC
  Administered 2018-10-02 – 2018-10-03 (×3): 1 g via INTRAVENOUS
  Filled 2018-10-02 (×4): qty 1

## 2018-10-02 MED ORDER — GABAPENTIN 300 MG PO CAPS
300.0000 mg | ORAL_CAPSULE | ORAL | Status: AC
  Administered 2018-10-03: 300 mg via ORAL
  Filled 2018-10-02: qty 1

## 2018-10-02 MED ORDER — FENTANYL CITRATE (PF) 100 MCG/2ML IJ SOLN: 25.0000 ug | INTRAMUSCULAR | Status: DC | PRN

## 2018-10-02 MED ORDER — FENTANYL CITRATE (PF) 100 MCG/2ML IJ SOLN
50.0000 ug | Freq: Once | INTRAMUSCULAR | Status: AC | PRN
  Administered 2018-10-02: 50 ug via INTRAVENOUS
  Filled 2018-10-02: qty 2

## 2018-10-02 MED ORDER — FAMOTIDINE IN NACL 20-0.9 MG/50ML-% IV SOLN
20.0000 mg | Freq: Two times a day (BID) | INTRAVENOUS | Status: DC
  Administered 2018-10-02 – 2018-10-03 (×3): 20 mg via INTRAVENOUS
  Filled 2018-10-02 (×3): qty 50

## 2018-10-02 MED ORDER — DIPHENHYDRAMINE HCL 25 MG PO CAPS
25.0000 mg | ORAL_CAPSULE | Freq: Four times a day (QID) | ORAL | Status: DC | PRN
  Administered 2018-10-03: 25 mg via ORAL
  Filled 2018-10-02 (×2): qty 1

## 2018-10-02 MED ORDER — ACETAMINOPHEN 500 MG PO TABS
1000.0000 mg | ORAL_TABLET | ORAL | Status: AC
  Administered 2018-10-03: 1000 mg via ORAL
  Filled 2018-10-02: qty 2

## 2018-10-02 NOTE — ED Triage Notes (Signed)
Pt verbalizes severe right abdominal pain worse in the morning; with scheduled surgery for gallbladder removal on the 25th.

## 2018-10-02 NOTE — ED Provider Notes (Signed)
[name]-EMERGENCY DEPT Provider Note   CSN: [id number] Arrival date & time: 10/02/18  1039     History   Chief Complaint Chief Complaint  Patient presents with  . Abdominal Pain    HPI Meghan Barry is a 37 y.o. female with a history of gastritis he was supposed to undergo laparoscopic cholecystectomy by Dr. Daphine Deutscher on 10/25 who presents with abdominal pain.  Patient reports she has been dealing with this pain on/off since 2015.  She notes over the last 3 weeks her symptoms have worsened.  Patient presented for similar complaints in August and underwent right upper quadrant ultrasound, CTA (no aneurysm), EGD that were unremarkable.  She was discharged with likely diagnosis of gastritis.  She notes she has been taking omeprazole around-the-clock without any relief.  She represented for her symptoms and had a HIDA scan that was done that was unremarkable.  She has since followed up with surgery, and is seeing Dr. Daphine Deutscher who believes her symptoms are related to her gallbladder.  She is scheduled to have a cholecystectomy on 10/25.  She notes that she is been having increasing flares over the last 1 week.  She notes this morning at 7 AM, she had a onset of pain in the right upper quadrant that radiated into her scapular region of her back.  She does note that she has associated shortness of breath but no chest pain.  Patient notes she has been trying Phenergan and oxycodone for her symptoms at home without any relief.  She notes associated nausea, non-bilious/nonbloody emesis, along with 3 episodes of non-melanous/nonbloody diarrhea.  Patient denies any NSAID use.  She denies any alcohol use.  No marijuana use.  She has had prior abdominal hysterectomy in the past.  She has she is still passing gas.  No lower abdominal pain, vaginal discharge or vaginal bleeding.  HPI  Past Medical History:  Diagnosis Date  . Anxiety   . Depression   . Stomach ulcer   . Venous insufficiency      Patient Active Problem List   Diagnosis Date Noted  . Gastritis 08/01/2018  . Abdominal pain 07/30/2018  . Anxiety 07/30/2018  . Depression 07/30/2018  . Peptic ulcer 07/30/2018    Past Surgical History:  Procedure Laterality Date  . ABDOMINAL HYSTERECTOMY    . anterior posterior vaginal wall repair    . BIOPSY  08/01/2018   Procedure: BIOPSY;  Surgeon: Lynann Bologna, MD;  Location: [facility] ENDOSCOPY;  Service: Endoscopy;;  . bladder tact    . DILATION AND CURETTAGE OF UTERUS    . ESOPHAGOGASTRODUODENOSCOPY N/A 08/01/2018   Procedure: ESOPHAGOGASTRODUODENOSCOPY (EGD);  Surgeon: Lynann Bologna, MD;  Location: [facility] ENDOSCOPY;  Service: Endoscopy;  Laterality: N/A;  . left ovary removal    . OVARIAN CYST SURGERY    . TONSILLECTOMY    . WRIST SURGERY       OB History    Gravida  5   Para  2   Term  2   Preterm      AB  3   Living  2     SAB  3   TAB      Ectopic      Multiple      Live Births  2            Home Medications    Prior to Admission medications   Medication Sig Start Date End Date Taking? Authorizing Provider  acetaminophen (TYLENOL) 500 MG tablet Take  1,000 mg by mouth every 6 (six) hours as needed.    [provider]  AMITIZA 24 MCG capsule Take 24 mcg by mouth daily. 07/25/18   [provider]  buPROPion (WELLBUTRIN SR) 150 MG 12 hr tablet Take 150 mg by mouth 2 (two) times daily.    [provider]  Cholecalciferol (VITAMIN D-1000 MAX ST) 1000 units tablet Take 1,000 Units by mouth daily.    [provider]  clonazePAM (KLONOPIN) 1 MG tablet Take 1 mg by mouth daily.    [provider]  Multiple Vitamin (MULTIVITAMIN) capsule Take 1 capsule by mouth daily.    [provider]  omeprazole (PRILOSEC) 20 MG capsule Take 20 mg by mouth 2 (two) times daily.     [provider]  oxyCODONE (OXY IR/ROXICODONE) 5 MG immediate release tablet Take 5 mg by mouth every 8 (eight) hours as needed for  pain. 07/26/18   [provider]  polyethylene glycol (MIRALAX / GLYCOLAX) packet Take 17 g by mouth daily.    [provider]  sertraline (ZOLOFT) 100 MG tablet Take 200 mg by mouth daily.    [provider]  tiZANidine (ZANAFLEX) 2 MG tablet Take 2 mg by mouth 2 (two) times daily as needed for muscle spasms. 07/23/18   [provider]  zolpidem (AMBIEN CR) 6.25 MG CR tablet Take 6.25 mg by mouth at bedtime.    [provider]    Family History Family History  Problem Relation Age of Onset  . Cancer Sister     Social History Social History   Tobacco Use  . Smoking status: Former Smoker    Last attempt to quit: 12/25/2013    Years since quitting: 4.7  . Smokeless tobacco: Never Used  . Tobacco comment: quit smoking 2015  Substance Use Topics  . Alcohol use: No  . Drug use: No     Allergies   Ciprofloxacin; Bactrim [sulfamethoxazole-trimethoprim]; Codeine; Dilaudid [hydromorphone hcl]; Lunesta [eszopiclone]; Morphine and related; Amoxicillin; and Penicillins   Review of Systems Review of Systems  All other systems reviewed and are negative.    Physical Exam Updated Vital Signs BP (!) 137/92 (BP Location: Right Arm)   Pulse 77   Temp 98 F (36.7 C) (Oral)   Resp (!) 22   SpO2 100%   Physical Exam  Constitutional: She appears well-developed and well-nourished.  Appears in pain.  HENT:  Head: Normocephalic and atraumatic.  Right Ear: External ear normal.  Left Ear: External ear normal.  Nose: Nose normal.  Mouth/Throat: Uvula is midline, oropharynx is clear and moist and mucous membranes are normal. No tonsillar exudate.  Eyes: Pupils are equal, round, and reactive to light. Right eye exhibits no discharge. Left eye exhibits no discharge. No scleral icterus.  Neck: Trachea normal. Neck supple. No spinous process tenderness present. No neck rigidity. Normal range of motion present.  Cardiovascular: Normal rate, regular  rhythm and intact distal pulses.  No murmur heard. Pulses:      Radial pulses are 2+ on the right side, and 2+ on the left side.       Dorsalis pedis pulses are 2+ on the right side, and 2+ on the left side.       Posterior tibial pulses are 2+ on the right side, and 2+ on the left side.  No lower extremity swelling or edema. Calves symmetric in size bilaterally.  Pulmonary/Chest: Effort normal and breath sounds normal. She exhibits no tenderness.  Abdominal:  Soft. Bowel sounds are normal. She exhibits no distension. There is tenderness in the right upper quadrant and epigastric area. There is positive Murphy's sign. There is no rigidity, no rebound, no guarding and no CVA tenderness.  Musculoskeletal: She exhibits no edema.  Lymphadenopathy:    She has no cervical adenopathy.  Neurological: She is alert.  Skin: Skin is warm and dry. No rash noted. She is not diaphoretic.  Psychiatric: She has a normal mood and affect.  Nursing note and vitals reviewed.    ED Treatments / Results  Labs (all labs ordered are listed, but only abnormal results are displayed) Labs Reviewed  COMPREHENSIVE METABOLIC PANEL - Abnormal; Notable for the following components:      Result Value   Total Protein 8.4 (*)    Total Bilirubin 1.6 (*)    All other components within normal limits  CBC WITH DIFFERENTIAL/PLATELET - Abnormal; Notable for the following components:   WBC 12.8 (*)    Neutro Abs 10.9 (*)    All other components within normal limits  LIPASE, BLOOD  URINALYSIS, ROUTINE W REFLEX MICROSCOPIC  CBC WITH DIFFERENTIAL/PLATELET  I-STAT TROPONIN, ED    EKG None  Radiology Dg Chest 2 View  Result Date: 10/02/2018 CLINICAL DATA:  Severe right-sided abdominal pain. EXAM: CHEST - 2 VIEW COMPARISON:  09/06/2016 FINDINGS: The heart size and mediastinal contours are within normal limits. Both lungs are clear. No pleural effusion or pneumothorax. The visualized skeletal structures are unremarkable.  IMPRESSION: No active cardiopulmonary disease. Electronically Signed   By: Amie Portland M.D.   On: 10/02/2018 12:28   US Abdomen Limited Ruq  Result Date: 10/02/2018 CLINICAL DATA:  Right upper quadrant abdominal pain EXAM: ULTRASOUND ABDOMEN LIMITED RIGHT UPPER QUADRANT COMPARISON:  Abdominal ultrasound of July 30, 2018 FINDINGS: Gallbladder: The gallbladder is adequately distended with no evidence of stones, wall thickening, or pericholecystic fluid. There is no positive sonographic Murphy's sign. Common bile duct: Diameter: 3.1 mm Liver: The hepatic echotexture is normal. The surface contour is smooth. There is no focal mass or ductal dilation. Evaluation of the portal vein was somewhat difficult due to the patient's pain and inability to lie still. There appears to be bidirectional flow within the portal vein. IMPRESSION: Normal appearance of the gallbladder and common bile duct. No hepatic parenchymal abnormalities. Apparent bidirectional flow within the portal vein may indicate mild portal hypertension. Electronically Signed   By: David  [place] M.D.   On: 10/02/2018 13:07   Procedures Procedures (including critical care time)  Medications Ordered in ED Medications  sodium chloride 0.9 % bolus 1,000 mL (1,000 mLs Intravenous New Bag/Given 10/02/18 1142)  ondansetron (ZOFRAN) injection 4 mg (4 mg Intravenous Given 10/02/18 1143)  fentaNYL (SUBLIMAZE) injection 50 mcg (50 mcg Intravenous Given 10/02/18 1143)    Initial Impression / Assessment and Plan / ED Course  I have reviewed the triage vital signs and the nursing notes.  Pertinent labs & imaging results that were available during my care of the patient were reviewed by me and considered in my medical decision making (see chart for details).     37 y.o. female presenting for worsening right upper quadrant abdominal pain that radiates to her scapulas and associated with nausea and vomiting.  Patient reports her symptoms been on/off since  2015.  She was recently admitted for this in August where she had a negative right upper quadrant ultrasound, negative CTA (no evidence of aneurysm or intrathoracic/intra-abdominal pathology), negative HIDA.  The patient was  admitted by GI in August and had a EGD done that showed localized mild inflammation characterized by erythema was found in the gastric antrum. Biopsy's were done and showed   "1. Duodenum, Biopsy - BENIGN SMALL BOWEL MUCOSA. - NO ACTIVE INFLAMMATION OR VILLOUS ATROPHY IDENTIFIED. 2. Stomach, biopsy - CHRONIC INACTIVE GASTRITIS, MILD. - THERE IS NO EVIDENCE OF HELICOBACTER PYLORI, DYSPLASIA, OR MALIGNANCY. - SEE COMMENT."  Patient has since followed up with surgery who recommended a cholecystectomy.  She is supposed to undergo this by Dr. Daphine Deutscher on 10/25.  She reports the same symptoms as previous onset this morning around 7 AM this morning.  On presentation the patient's vital signs are reassuring.  She is without fever, tachycardia, tachypnea, hypoxia or hypotension.  Patient's work-up has been overall reassuring.  Patient has mild leukocytosis of 12,800.  Her total bilirubin is elevated at 1.6.  Labs are otherwise unremarkable.  No significant LFT changes.  Chest x-ray, EKG, troponin and other labs reviewed.  Her pain is currently controlled.  She denies any current nausea and has been without any emesis in the department.  Right upper quadrant ultrasound shows no evidence of cholelithiasis or cholecystitis.  Given patient's plan to undergo surgery will consult general surgery for further recommendations.  1:52 PM Spoke with Surgery - Marisue Ivan. Surgery will plan to come see the patient.   2:50 PM  Surgery has admitted the patient to their services.   Final Clinical Impressions(s) / ED Diagnoses   Final diagnoses:  RUQ abdominal pain  Nausea and vomiting in adult    ED Discharge Orders    None       Jacinto Halim, Cordelia Poche 10/02/18 1451    Mancel Bale,  MD 10/02/18 [id number]

## 2018-10-02 NOTE — Progress Notes (Signed)
Pharmacy Antibiotic Note  Meghan Barry is a 38 y.o. female admitted on 10/02/2018 with intra-abdominal infection (possible cholecystitis with plans for lap chole tomorrow).  She is allergic to amoxicillin & Cipro.  Pharmacy has been consulted for Meropenem dosing. Her renal function is normal with estimated CrCl>75ml/min.  Plan: Meropenem 1gm IV q8h     Temp (24hrs), Avg:98.3 F (36.8 C), Min:98 F (36.7 C), Max:98.6 F (37 C)  Recent Labs  Lab 10/02/18 1050 10/02/18 1126  WBC  --  12.8*  CREATININE 0.89  --     CrCl cannot be calculated (Unknown ideal weight.).    Allergies  Allergen Reactions  . Ciprofloxacin Hives    flushed face  . Bactrim [Sulfamethoxazole-Trimethoprim] Other (See Comments)    Mouth sores, stiff neck and H/A  . Codeine Other (See Comments)    Flushed red face, migraines  . Dilaudid [Hydromorphone Hcl]     migraine  . Lunesta [Eszopiclone] Other (See Comments)    hallucinations  . Morphine And Related Other (See Comments)    Severe migraine   . Amoxicillin Rash    Has patient had a PCN reaction causing immediate rash, facial/tongue/throat swelling, SOB or lightheadedness with hypotension: Yes Has patient had a PCN reaction causing severe rash involving mucus membranes or skin necrosis: No Has patient had a PCN reaction that required hospitalization: No Has patient had a PCN reaction occurring within the last 10 years: No If all of the above answers are "NO", then may proceed with Cephalosporin use.  Marland Kitchen Penicillins Rash    Has patient had a PCN reaction causing immediate rash, facial/tongue/throat swelling, SOB or lightheadedness with hypotension: Yes Has patient had a PCN reaction causing severe rash involving mucus membranes or skin necrosis: No Has patient had a PCN reaction that required hospitalization: No Has patient had a PCN reaction occurring within the last 10 years: No If all of the above answers are "NO", then may proceed with  Cephalosporin use.    Antimicrobials this admission: 10/9 Meropenem >>   Dose adjustments this admission:  Microbiology results: none  Thank you for allowing pharmacy to be a part of this patient's care.  Elson Clan 10/02/2018 4:16 PM

## 2018-10-02 NOTE — H&P (Signed)
[facility] Admission Note  Meghan Barry [birth year]  [id number].    Requesting MD:  Chief Complaint/Reason for Consult: Right upper quadrant pain  HPI:  Meghan Barry is a 37 year old female with a past medical history of gastric ulcer, anxiety, and chronic intermittent right upper quadrant pain who presented to [facility] emergency department with a chief complaint of severe right upper quadrant pain.  Patient reports intermittent right upper quadrant pain since 2015 after she was diagnosed with a gastric ulcer and appropriately treated.  She was recently hospitalized in August 2019 with a chief complaint of right upper quadrant pain associated with nausea and had multiple tests performed including right upper quadrant ultrasound, HIDA scan, CTA, all of which were negative for cholelithiasis or signs of acute cholecystitis.  She had an upper endoscopy performed in August 2019 which showed mild gastritis but was otherwise unremarkable.  Today the patient states that she has had 2 to 3 weeks of severe, constant, right upper quadrant pain and has been unable to tolerate oral intake for about 3 days.  Pain is described as sharp, with radiation around her right flank to her back, behind her scapula, and sometimes to the right side of her neck.  Emesis has been nonbloody.  Also endorses nonbloody diarrhea.  She denies fever, chills, chest pain, shortness of breath.  Patient has taken Zofran, Phenergan, Vicodin, and oxycodone at home none of which completely remove her symptoms.  She reports seeing Dr. Dema Severin followed by Dr. Hassell Done in our office and says she is scheduled for elective cholecystectomy with intraoperative cholangiogram on 10/25 by Dr. Hassell Done.  Past abdominal surgeries include laparoscopic surgery for ovarian cysts and a total pelvic reconstruction with hysterectomy for uterovaginal prolapse, dysmenorrhea, stress incontinence.  Denies previous complications under general anesthesia.    ROS: Review of Systems  Constitutional: Negative for chills and fever.  Respiratory: Negative.   Cardiovascular: Negative for chest pain.  Gastrointestinal: Positive for abdominal pain, diarrhea, nausea and vomiting. Negative for blood in stool and melena.  Genitourinary: Negative.     Family History  Problem Relation Age of Onset  . Cancer Sister     Past Medical History:  Diagnosis Date  . Anxiety   . Depression   . Stomach ulcer   . Venous insufficiency     Past Surgical History:  Procedure Laterality Date  . ABDOMINAL HYSTERECTOMY    . anterior posterior vaginal wall repair    . BIOPSY  08/01/2018   Procedure: BIOPSY;  Surgeon: Jackquline Denmark, MD;  Location: [facility] ENDOSCOPY;  Service: Endoscopy;;  . bladder tact    . DILATION AND CURETTAGE OF UTERUS    . ESOPHAGOGASTRODUODENOSCOPY N/A 08/01/2018   Procedure: ESOPHAGOGASTRODUODENOSCOPY (EGD);  Surgeon: Jackquline Denmark, MD;  Location: [facility] ENDOSCOPY;  Service: Endoscopy;  Laterality: N/A;  . left ovary removal    . OVARIAN CYST SURGERY    . TONSILLECTOMY    . WRIST SURGERY      Social History:  reports that she quit smoking about 4 years ago. She has never used smokeless tobacco. She reports that she does not drink alcohol or use drugs.  Allergies:  Allergies  Allergen Reactions  . Ciprofloxacin Hives    flushed face  . Bactrim [Sulfamethoxazole-Trimethoprim] Other (See Comments)    Mouth sores, stiff neck and H/A  . Codeine Other (See Comments)    Flushed red face, migraines  . Dilaudid [Hydromorphone Hcl]     migraine  . Lunesta [Eszopiclone] Other (See Comments)  hallucinations  . Morphine And Related Other (See Comments)    Severe migraine   . Amoxicillin Rash    Has patient had a PCN reaction causing immediate rash, facial/tongue/throat swelling, SOB or lightheadedness with hypotension: Yes Has patient had a PCN reaction causing severe rash involving mucus membranes or skin necrosis: No Has patient had a  PCN reaction that required hospitalization: No Has patient had a PCN reaction occurring within the last 10 years: No If all of the above answers are "NO", then may proceed with Cephalosporin use.  Marland Kitchen Penicillins Rash    Has patient had a PCN reaction causing immediate rash, facial/tongue/throat swelling, SOB or lightheadedness with hypotension: Yes Has patient had a PCN reaction causing severe rash involving mucus membranes or skin necrosis: No Has patient had a PCN reaction that required hospitalization: No Has patient had a PCN reaction occurring within the last 10 years: No If all of the above answers are "NO", then may proceed with Cephalosporin use.     (Not in a hospital admission)  Blood pressure (!) 110/55, pulse 71, temperature 98 F (36.7 C), temperature source Oral, resp. rate 16, SpO2 100 %. Physical Exam: Physical Exam  Results for orders placed or performed during the hospital encounter of 10/02/18 (from the past 48 hour(s))  Lipase, blood     Status: None   Collection Time: 10/02/18 10:50 AM  Result Value Ref Range   Lipase 33 11 - 51 U/L    Comment: Performed at [facility], [place] [address], [place], [place] [address]  Comprehensive metabolic panel     Status: Abnormal   Collection Time: 10/02/18 10:50 AM  Result Value Ref Range   Sodium 137 135 - 145 mmol/L   Potassium 4.1 3.5 - 5.1 mmol/L   Chloride 102 98 - 111 mmol/L   CO2 22 22 - 32 mmol/L   Glucose, Bld 95 70 - 99 mg/dL   BUN 12 6 - 20 mg/dL   Creatinine, Ser 0.89 0.44 - 1.00 mg/dL   Calcium 9.8 8.9 - 10.3 mg/dL   Total Protein 8.4 (H) 6.5 - 8.1 g/dL   Albumin 4.8 3.5 - 5.0 g/dL   AST 29 15 - 41 U/L   ALT 18 0 - 44 U/L   Alkaline Phosphatase 55 38 - 126 U/L   Total Bilirubin 1.6 (H) 0.3 - 1.2 mg/dL   GFR calc non Af Amer >60 >60 mL/min   GFR calc Af Amer >60 >60 mL/min    Comment: (NOTE) The eGFR has been calculated using the CKD EPI equation. This calculation has not been  validated in all clinical situations. eGFR's persistently <60 mL/min signify possible Chronic Kidney Disease.    Anion gap 13 5 - 15    Comment: Performed at [facility], [place] [address] [place], [place] [address]  CBC with Differential/Platelet     Status: Abnormal   Collection Time: 10/02/18 11:26 AM  Result Value Ref Range   WBC 12.8 (H) 4.0 - 10.5 K/uL   RBC 4.58 3.87 - 5.11 MIL/uL   Hemoglobin 12.0 12.0 - 15.0 g/dL   HCT 38.2 36.0 - 46.0 %   MCV 83.4 80.0 - 100.0 fL   MCH 26.2 26.0 - 34.0 pg   MCHC 31.4 30.0 - 36.0 g/dL   RDW 15.3 11.5 - 15.5 %   Platelets 305 150 - 400 K/uL   nRBC 0.0 0.0 - 0.2 %   Neutrophils Relative % 84 %  Neutro Abs 10.9 (H) 1.7 - 7.7 K/uL   Lymphocytes Relative 7 %   Lymphs Abs 0.9 0.7 - 4.0 K/uL   Monocytes Relative 8 %   Monocytes Absolute 1.0 0.1 - 1.0 K/uL   Eosinophils Relative 0 %   Eosinophils Absolute 0.0 0.0 - 0.5 K/uL   Basophils Relative 0 %   Basophils Absolute 0.0 0.0 - 0.1 K/uL   Immature Granulocytes 1 %   Abs Immature Granulocytes 0.06 0.00 - 0.07 K/uL    Comment: Performed at [facility], [place] [address], [place], [place] [address]  I-stat troponin, ED     Status: None   Collection Time: 10/02/18 11:48 AM  Result Value Ref Range   Troponin i, poc 0.00 0.00 - 0.08 ng/mL   Comment 3            Comment: Due to the release kinetics of cTnI, a negative result within the first hours of the onset of symptoms does not rule out myocardial infarction with certainty. If myocardial infarction is still suspected, repeat the test at appropriate intervals.    Dg Chest 2 View  Result Date: 10/02/2018 CLINICAL DATA:  Severe right-sided abdominal pain. EXAM: CHEST - 2 VIEW COMPARISON:  09/06/2016 FINDINGS: The heart size and mediastinal contours are within normal limits. Both lungs are clear. No pleural effusion or pneumothorax. The visualized skeletal structures are unremarkable. IMPRESSION: No active  cardiopulmonary disease. Electronically Signed   By: Lajean Manes M.D.   On: 10/02/2018 12:28   US Abdomen Limited Ruq  Result Date: 10/02/2018 CLINICAL DATA:  Right upper quadrant abdominal pain EXAM: ULTRASOUND ABDOMEN LIMITED RIGHT UPPER QUADRANT COMPARISON:  Abdominal ultrasound of July 30, 2018 FINDINGS: Gallbladder: The gallbladder is adequately distended with no evidence of stones, wall thickening, or pericholecystic fluid. There is no positive sonographic Murphy's sign. Common bile duct: Diameter: 3.1 mm Liver: The hepatic echotexture is normal. The surface contour is smooth. There is no focal mass or ductal dilation. Evaluation of the portal vein was somewhat difficult due to the patient's pain and inability to lie still. There appears to be bidirectional flow within the portal vein. IMPRESSION: Normal appearance of the gallbladder and common bile duct. No hepatic parenchymal abnormalities. Apparent bidirectional flow within the portal vein may indicate mild portal hypertension. Electronically Signed   By: David  Martinique M.D.   On: 10/02/2018 13:07   Assessment/Plan Anxiety History of gastric ulcer 2015  Right upper quadrant pain with intractable nausea vomiting Patient's symptoms of worsening right upper quadrant pain associated with radiation to her back, nausea, intolerance of oral intake, and some diarrhea are consistent with gallbladder disease.  All objective tests up to this point have been negative.  Due to uncontrolled pain, nausea, vomiting the patient will be admitted to the hospital.  We discussed laparoscopic cholecystectomy tomorrow for possible gallbladder disease/cholecystitis, and the patient agrees with this plan.   Jill Alexanders, [facility] 10/02/2018, 2:48 PM Pager: [phone] Consults: [phone]

## 2018-10-02 NOTE — ED Notes (Signed)
ED TO INPATIENT HANDOFF REPORT  Name/Age/Gender Meghan Barry 37 y.o. female  Code Status    Code Status Orders  (From admission, onward)         Start     Ordered   10/02/18 1443  Full code  Continuous     10/02/18 1448        Code Status History    Date Active Date Inactive Code Status Order ID Comments User Context   07/30/2018 2023 08/01/2018 2014 Full Code [id number]  Bethena Roys, MD ED    Advance Directive Documentation     Most Recent Value  Type of Advance Directive  Healthcare Power of [place], Living will  Pre-existing out of facility DNR order (yellow form or pink MOST form)  -  "MOST" Form in Place?  -      Home/SNF/Other Home  Chief Complaint vomiting / chills / abd pain   Level of Care/Admitting Diagnosis ED Disposition    ED Disposition Condition [place]: [facility] [[id number]]  Level of Care: Med-Surg [16]  Diagnosis: Cholecystitis [683729]  Admitting Physician: CCS, Quinn  Attending Physician: CCS, MD [[id number]  PT Class (Do Not Modify): Observation [104]  PT Acc Code (Do Not Modify): Observation [10022]       Medical History Past Medical History:  Diagnosis Date  . Anxiety   . Depression   . Stomach ulcer   . Venous insufficiency     Allergies Allergies  Allergen Reactions  . Ciprofloxacin Hives    flushed face  . Bactrim [Sulfamethoxazole-Trimethoprim] Other (See Comments)    Mouth sores, stiff neck and H/A  . Codeine Other (See Comments)    Flushed red face, migraines  . Dilaudid [Hydromorphone Hcl]     migraine  . Lunesta [Eszopiclone] Other (See Comments)    hallucinations  . Morphine And Related Other (See Comments)    Severe migraine   . Amoxicillin Rash    Has patient had a PCN reaction causing immediate rash, facial/tongue/throat swelling, SOB or lightheadedness with hypotension: Yes Has patient had a PCN reaction causing severe rash involving mucus membranes or skin  necrosis: No Has patient had a PCN reaction that required hospitalization: No Has patient had a PCN reaction occurring within the last 10 years: No If all of the above answers are "NO", then may proceed with Cephalosporin use.  Marland Kitchen Penicillins Rash    Has patient had a PCN reaction causing immediate rash, facial/tongue/throat swelling, SOB or lightheadedness with hypotension: Yes Has patient had a PCN reaction causing severe rash involving mucus membranes or skin necrosis: No Has patient had a PCN reaction that required hospitalization: No Has patient had a PCN reaction occurring within the last 10 years: No If all of the above answers are "NO", then may proceed with Cephalosporin use.    IV Location/Drains/Wounds Patient Lines/Drains/Airways Status   Active Line/Drains/Airways    Name:   Placement date:   Placement time:   Site:   Days:   Peripheral IV 10/02/18 Left   10/02/18    1104    -   less than 1          Labs/Imaging Results for orders placed or performed during the hospital encounter of 10/02/18 (from the past 48 hour(s))  Lipase, blood     Status: None   Collection Time: 10/02/18 10:50 AM  Result Value Ref Range   Lipase 33 11 - 51 U/L  Comment: Performed at [facility], [place] [address] [place], [place] [address]  Comprehensive metabolic panel     Status: Abnormal   Collection Time: 10/02/18 10:50 AM  Result Value Ref Range   Sodium 137 135 - 145 mmol/L   Potassium 4.1 3.5 - 5.1 mmol/L   Chloride 102 98 - 111 mmol/L   CO2 22 22 - 32 mmol/L   Glucose, Bld 95 70 - 99 mg/dL   BUN 12 6 - 20 mg/dL   Creatinine, Ser 0.89 0.44 - 1.00 mg/dL   Calcium 9.8 8.9 - 10.3 mg/dL   Total Protein 8.4 (H) 6.5 - 8.1 g/dL   Albumin 4.8 3.5 - 5.0 g/dL   AST 29 15 - 41 U/L   ALT 18 0 - 44 U/L   Alkaline Phosphatase 55 38 - 126 U/L   Total Bilirubin 1.6 (H) 0.3 - 1.2 mg/dL   GFR calc non Af Amer >60 >60 mL/min   GFR calc Af Amer >60 >60 mL/min    Comment:  (NOTE) The eGFR has been calculated using the CKD EPI equation. This calculation has not been validated in all clinical situations. eGFR's persistently <60 mL/min signify possible Chronic Kidney Disease.    Anion gap 13 5 - 15    Comment: Performed at [facility], [place] [address], [place], [place] [address]  CBC with Differential/Platelet     Status: Abnormal   Collection Time: 10/02/18 11:26 AM  Result Value Ref Range   WBC 12.8 (H) 4.0 - 10.5 K/uL   RBC 4.58 3.87 - 5.11 MIL/uL   Hemoglobin 12.0 12.0 - 15.0 g/dL   HCT 38.2 36.0 - 46.0 %   MCV 83.4 80.0 - 100.0 fL   MCH 26.2 26.0 - 34.0 pg   MCHC 31.4 30.0 - 36.0 g/dL   RDW 15.3 11.5 - 15.5 %   Platelets 305 150 - 400 K/uL   nRBC 0.0 0.0 - 0.2 %   Neutrophils Relative % 84 %   Neutro Abs 10.9 (H) 1.7 - 7.7 K/uL   Lymphocytes Relative 7 %   Lymphs Abs 0.9 0.7 - 4.0 K/uL   Monocytes Relative 8 %   Monocytes Absolute 1.0 0.1 - 1.0 K/uL   Eosinophils Relative 0 %   Eosinophils Absolute 0.0 0.0 - 0.5 K/uL   Basophils Relative 0 %   Basophils Absolute 0.0 0.0 - 0.1 K/uL   Immature Granulocytes 1 %   Abs Immature Granulocytes 0.06 0.00 - 0.07 K/uL    Comment: Performed at [facility], [place] [address] [place],  [address]  I-stat troponin, ED     Status: None   Collection Time: 10/02/18 11:48 AM  Result Value Ref Range   Troponin i, poc 0.00 0.00 - 0.08 ng/mL   Comment 3            Comment: Due to the release kinetics of cTnI, a negative result within the first hours of the onset of symptoms does not rule out myocardial infarction with certainty. If myocardial infarction is still suspected, repeat the test at appropriate intervals.    Dg Chest 2 View  Result Date: 10/02/2018 CLINICAL DATA:  Severe right-sided abdominal pain. EXAM: CHEST - 2 VIEW COMPARISON:  09/06/2016 FINDINGS: The heart size and mediastinal contours are within normal limits. Both lungs are clear. No pleural  effusion or pneumothorax. The visualized skeletal structures are unremarkable. IMPRESSION: No active cardiopulmonary disease. Electronically Signed   By: Shanon Brow  Ormond M.D.   On: 10/02/2018 12:28   US Abdomen Limited Ruq  Result Date: 10/02/2018 CLINICAL DATA:  Right upper quadrant abdominal pain EXAM: ULTRASOUND ABDOMEN LIMITED RIGHT UPPER QUADRANT COMPARISON:  Abdominal ultrasound of July 30, 2018 FINDINGS: Gallbladder: The gallbladder is adequately distended with no evidence of stones, wall thickening, or pericholecystic fluid. There is no positive sonographic Murphy's sign. Common bile duct: Diameter: 3.1 mm Liver: The hepatic echotexture is normal. The surface contour is smooth. There is no focal mass or ductal dilation. Evaluation of the portal vein was somewhat difficult due to the patient's pain and inability to lie still. There appears to be bidirectional flow within the portal vein. IMPRESSION: Normal appearance of the gallbladder and common bile duct. No hepatic parenchymal abnormalities. Apparent bidirectional flow within the portal vein may indicate mild portal hypertension. Electronically Signed   By: David  Martinique M.D.   On: 10/02/2018 13:07   None  Pending Labs Unresulted Labs (From admission, onward)    Start     Ordered   10/03/18 0500  Comprehensive metabolic panel  Tomorrow morning,   R     10/02/18 1448   10/03/18 0500  CBC  Tomorrow morning,   R     10/02/18 1448   10/02/18 1058  CBC with Differential  STAT,   STAT     10/02/18 1057   10/02/18 1050  Urinalysis, Routine w reflex microscopic  STAT,   STAT     10/02/18 1049          Vitals/Pain Today's Vitals   10/02/18 1255 10/02/18 1255 10/02/18 1256 10/02/18 1403  BP:  (!) 109/57 (!) 109/57 (!) 110/55  Pulse:  72 70 71  Resp:  _0 Temp:      TempSrc:      SpO2:  100% 98% 100%  PainSc: 6        Isolation Precautions No active isolations  Medications Medications  dextrose 5 % with KCl 20 mEq / L   infusion (has no administration in time range)  acetaminophen (TYLENOL) tablet 650 mg (has no administration in time range)  oxyCODONE (Oxy IR/ROXICODONE) immediate release tablet 5-10 mg (has no administration in time range)  methocarbamol (ROBAXIN) tablet 500 mg (has no administration in time range)  diphenhydrAMINE (BENADRYL) capsule 25 mg (has no administration in time range)    Or  diphenhydrAMINE (BENADRYL) injection 25 mg (has no administration in time range)  ondansetron (ZOFRAN-ODT) disintegrating tablet 4 mg (has no administration in time range)    Or  ondansetron (ZOFRAN) injection 4 mg (has no administration in time range)  prochlorperazine (COMPAZINE) tablet 10 mg (has no administration in time range)    Or  prochlorperazine (COMPAZINE) injection 5-10 mg (has no administration in time range)  famotidine (PEPCID) IVPB 20 mg premix (has no administration in time range)  ketorolac (TORADOL) 15 MG/ML injection 15 mg (has no administration in time range)    Followed by  ketorolac (TORADOL) 15 MG/ML injection 15 mg (has no administration in time range)  fentaNYL (SUBLIMAZE) injection 25-50 mcg (has no administration in time range)  ondansetron (ZOFRAN) injection 4 mg (4 mg Intravenous Given 10/02/18 1143)  sodium chloride 0.9 % bolus 1,000 mL (0 mLs Intravenous Stopped 10/02/18 1255)  fentaNYL (SUBLIMAZE) injection 50 mcg (50 mcg Intravenous Given 10/02/18 1143)  fentaNYL (SUBLIMAZE) injection 50 mcg (50 mcg Intravenous Given 10/02/18 1212)  fentaNYL (SUBLIMAZE) injection 50 mcg (50 mcg Intravenous Given 10/02/18 1402)  Mobility walks

## 2018-10-03 ENCOUNTER — Encounter (HOSPITAL_COMMUNITY): Payer: Self-pay | Admitting: General Practice

## 2018-10-03 ENCOUNTER — Observation Stay (HOSPITAL_COMMUNITY): Admitting: Anesthesiology

## 2018-10-03 ENCOUNTER — Observation Stay (HOSPITAL_COMMUNITY)

## 2018-10-03 ENCOUNTER — Encounter (HOSPITAL_COMMUNITY)
Admission: EM | Disposition: A | Discharge status: Home / Self Care | Payer: Self-pay | Source: Home / Self Care | Attending: Emergency Medicine

## 2018-10-03 HISTORY — PX: CHOLECYSTECTOMY: SHX55

## 2018-10-03 LAB — COMPREHENSIVE METABOLIC PANEL
ALK PHOS: 40 U/L (ref 38–126)
ALT: 14 U/L (ref 0–44)
AST: 24 U/L (ref 15–41)
Albumin: 3.4 g/dL — ABNORMAL LOW (ref 3.5–5.0)
Anion gap: 8 (ref 5–15)
BUN: 7 mg/dL (ref 6–20)
CHLORIDE: 108 mmol/L (ref 98–111)
CO2: 23 mmol/L (ref 22–32)
CREATININE: 0.83 mg/dL (ref 0.44–1.00)
Calcium: 8.3 mg/dL — ABNORMAL LOW (ref 8.9–10.3)
GFR calc Af Amer: >60 mL/min (ref >60)
Glucose, Bld: 102 mg/dL — ABNORMAL HIGH (ref 70–99)
Potassium: 3.7 mmol/L (ref 3.5–5.1)
Sodium: 139 mmol/L (ref 135–145)
Total Bilirubin: 1 mg/dL (ref 0.3–1.2)
Total Protein: 6.3 g/dL — ABNORMAL LOW (ref 6.5–8.1)

## 2018-10-03 LAB — CBC
HEMATOCRIT: 34.2 % — AB (ref 36.0–46.0)
Hemoglobin: 10.2 g/dL — ABNORMAL LOW (ref 12.0–15.0)
MCH: 26.1 pg (ref 26.0–34.0)
MCHC: 29.8 g/dL — ABNORMAL LOW (ref 30.0–36.0)
MCV: 87.5 fL (ref 80.0–100.0)
Platelets: 182 10*3/uL (ref 150–400)
RBC: 3.91 MIL/uL (ref 3.87–5.11)
RDW: 15.6 % — AB (ref 11.5–15.5)
WBC: 4.4 10*3/uL (ref 4.0–10.5)
nRBC: 0 % (ref 0.0–0.2)

## 2018-10-03 LAB — SURGICAL PCR SCREEN
MRSA, PCR: NEGATIVE
Staphylococcus aureus: NEGATIVE

## 2018-10-03 SURGERY — LAPAROSCOPIC CHOLECYSTECTOMY WITH INTRAOPERATIVE CHOLANGIOGRAM
Anesthesia: General

## 2018-10-03 SURGERY — LAPAROSCOPIC CHOLECYSTECTOMY WITH INTRAOPERATIVE CHOLANGIOGRAM
Anesthesia: General | Site: Abdomen

## 2018-10-03 MED ORDER — SUGAMMADEX SODIUM 200 MG/2ML IV SOLN
INTRAVENOUS | Status: AC
  Filled 2018-10-03: qty 2

## 2018-10-03 MED ORDER — FENTANYL CITRATE (PF) 100 MCG/2ML IJ SOLN
INTRAMUSCULAR | Status: AC
  Administered 2018-10-03: 50 ug via INTRAVENOUS
  Filled 2018-10-03: qty 2

## 2018-10-03 MED ORDER — SUGAMMADEX SODIUM 200 MG/2ML IV SOLN
INTRAVENOUS | Status: DC | PRN
  Administered 2018-10-03: 200 mg via INTRAVENOUS

## 2018-10-03 MED ORDER — CLONAZEPAM 1 MG PO TABS
1.0000 mg | ORAL_TABLET | Freq: Every day | ORAL | Status: DC
  Administered 2018-10-03: 1 mg via ORAL
  Filled 2018-10-03: qty 1

## 2018-10-03 MED ORDER — EPHEDRINE SULFATE-NACL 50-0.9 MG/10ML-% IV SOSY
PREFILLED_SYRINGE | INTRAVENOUS | Status: DC | PRN
  Administered 2018-10-03: 20 mg via INTRAVENOUS
  Administered 2018-10-03: 10 mg via INTRAVENOUS

## 2018-10-03 MED ORDER — BUPIVACAINE-EPINEPHRINE (PF) 0.5% -1:200000 IJ SOLN
INTRAMUSCULAR | Status: AC
  Filled 2018-10-03: qty 30

## 2018-10-03 MED ORDER — DEXAMETHASONE SODIUM PHOSPHATE 10 MG/ML IJ SOLN
INTRAMUSCULAR | Status: DC | PRN
  Administered 2018-10-03: 10 mg via INTRAVENOUS

## 2018-10-03 MED ORDER — ONDANSETRON HCL 4 MG/2ML IJ SOLN
INTRAMUSCULAR | Status: DC | PRN
  Administered 2018-10-03: 4 mg via INTRAVENOUS

## 2018-10-03 MED ORDER — PHENYLEPHRINE 40 MCG/ML (10ML) SYRINGE FOR IV PUSH (FOR BLOOD PRESSURE SUPPORT)
PREFILLED_SYRINGE | INTRAVENOUS | Status: AC
  Filled 2018-10-03: qty 20

## 2018-10-03 MED ORDER — FENTANYL CITRATE (PF) 100 MCG/2ML IJ SOLN
25.0000 ug | INTRAMUSCULAR | Status: DC | PRN
  Administered 2018-10-03 (×2): 50 ug via INTRAVENOUS

## 2018-10-03 MED ORDER — PROMETHAZINE HCL 25 MG/ML IJ SOLN: 6.2500 mg | INTRAMUSCULAR | Status: DC | PRN

## 2018-10-03 MED ORDER — KETOROLAC TROMETHAMINE 30 MG/ML IJ SOLN
INTRAMUSCULAR | Status: DC | PRN
  Administered 2018-10-03: 30 mg via INTRAVENOUS

## 2018-10-03 MED ORDER — FENTANYL CITRATE (PF) 100 MCG/2ML IJ SOLN
50.0000 ug | INTRAMUSCULAR | Status: AC
  Administered 2018-10-03: 50 ug via INTRAVENOUS

## 2018-10-03 MED ORDER — MIDAZOLAM HCL 5 MG/5ML IJ SOLN
INTRAMUSCULAR | Status: DC | PRN
  Administered 2018-10-03: 2 mg via INTRAVENOUS

## 2018-10-03 MED ORDER — DEXMEDETOMIDINE HCL IN NACL 200 MCG/50ML IV SOLN
INTRAVENOUS | Status: AC
  Filled 2018-10-03: qty 50

## 2018-10-03 MED ORDER — ADULT MULTIVITAMIN W/MINERALS CH
1.0000 | ORAL_TABLET | Freq: Every day | ORAL | Status: DC
  Administered 2018-10-04: 1 via ORAL
  Filled 2018-10-03: qty 1

## 2018-10-03 MED ORDER — SERTRALINE HCL 100 MG PO TABS
200.0000 mg | ORAL_TABLET | Freq: Every day | ORAL | Status: DC
  Administered 2018-10-03: 200 mg via ORAL
  Filled 2018-10-03: qty 2

## 2018-10-03 MED ORDER — LACTATED RINGERS IV SOLN
INTRAVENOUS | Status: DC | PRN
  Administered 2018-10-03 (×2): via INTRAVENOUS

## 2018-10-03 MED ORDER — DEXAMETHASONE SODIUM PHOSPHATE 4 MG/ML IJ SOLN: INTRAMUSCULAR | Status: DC | PRN

## 2018-10-03 MED ORDER — BUPIVACAINE-EPINEPHRINE 0.5% -1:200000 IJ SOLN
INTRAMUSCULAR | Status: DC | PRN
  Administered 2018-10-03: 30 mL

## 2018-10-03 MED ORDER — PROPOFOL 10 MG/ML IV BOLUS
INTRAVENOUS | Status: AC
  Filled 2018-10-03: qty 20

## 2018-10-03 MED ORDER — PHENYLEPHRINE 40 MCG/ML (10ML) SYRINGE FOR IV PUSH (FOR BLOOD PRESSURE SUPPORT)
PREFILLED_SYRINGE | INTRAVENOUS | Status: DC | PRN
  Administered 2018-10-03: 80 ug via INTRAVENOUS

## 2018-10-03 MED ORDER — BUPIVACAINE LIPOSOME 1.3 % IJ SUSP
20.0000 mL | Freq: Once | INTRAMUSCULAR | Status: AC
  Administered 2018-10-03: 20 mL
  Filled 2018-10-03: qty 20

## 2018-10-03 MED ORDER — ACETAMINOPHEN 10 MG/ML IV SOLN: 1000.0000 mg | Freq: Once | INTRAVENOUS | Status: DC | PRN

## 2018-10-03 MED ORDER — KETOROLAC TROMETHAMINE 30 MG/ML IJ SOLN
INTRAMUSCULAR | Status: AC
  Filled 2018-10-03: qty 1

## 2018-10-03 MED ORDER — FENTANYL CITRATE (PF) 100 MCG/2ML IJ SOLN
INTRAMUSCULAR | Status: AC
  Filled 2018-10-03: qty 2

## 2018-10-03 MED ORDER — SUCCINYLCHOLINE CHLORIDE 200 MG/10ML IV SOSY
PREFILLED_SYRINGE | INTRAVENOUS | Status: AC
  Filled 2018-10-03: qty 20

## 2018-10-03 MED ORDER — 0.9 % SODIUM CHLORIDE (POUR BTL) OPTIME
TOPICAL | Status: DC | PRN
  Administered 2018-10-03: 1000 mL

## 2018-10-03 MED ORDER — LACTATED RINGERS IV SOLN
Freq: Once | INTRAVENOUS | Status: AC
  Administered 2018-10-03: 12:00:00 via INTRAVENOUS

## 2018-10-03 MED ORDER — ROCURONIUM BROMIDE 100 MG/10ML IV SOLN
INTRAVENOUS | Status: AC
  Filled 2018-10-03: qty 1

## 2018-10-03 MED ORDER — LIDOCAINE HCL (CARDIAC) PF 100 MG/5ML IV SOSY
PREFILLED_SYRINGE | INTRAVENOUS | Status: AC
  Filled 2018-10-03: qty 5

## 2018-10-03 MED ORDER — BUPROPION HCL ER (SR) 150 MG PO TB12
150.0000 mg | ORAL_TABLET | Freq: Two times a day (BID) | ORAL | Status: DC
  Administered 2018-10-03 – 2018-10-04 (×2): 150 mg via ORAL
  Filled 2018-10-03 (×2): qty 1

## 2018-10-03 MED ORDER — PROPOFOL 10 MG/ML IV BOLUS
INTRAVENOUS | Status: DC | PRN
  Administered 2018-10-03: 160 mg via INTRAVENOUS

## 2018-10-03 MED ORDER — ENOXAPARIN SODIUM 40 MG/0.4ML ~~LOC~~ SOLN: 40.0000 mg | SUBCUTANEOUS | Status: DC

## 2018-10-03 MED ORDER — LACTATED RINGERS IR SOLN
Status: DC | PRN
  Administered 2018-10-03: 1000 mL

## 2018-10-03 MED ORDER — MIDAZOLAM HCL 2 MG/2ML IJ SOLN
INTRAMUSCULAR | Status: AC
  Filled 2018-10-03: qty 2

## 2018-10-03 MED ORDER — MULTIVITAMINS PO CAPS: 1.0000 | ORAL_CAPSULE | Freq: Every day | ORAL | Status: DC

## 2018-10-03 MED ORDER — IOPAMIDOL (ISOVUE-300) INJECTION 61%
INTRAVENOUS | Status: AC
  Filled 2018-10-03: qty 50

## 2018-10-03 MED ORDER — ONDANSETRON HCL 4 MG/2ML IJ SOLN
INTRAMUSCULAR | Status: AC
  Filled 2018-10-03: qty 2

## 2018-10-03 MED ORDER — ROCURONIUM BROMIDE 100 MG/10ML IV SOLN
INTRAVENOUS | Status: AC
  Filled 2018-10-03: qty 3

## 2018-10-03 MED ORDER — FENTANYL CITRATE (PF) 250 MCG/5ML IJ SOLN
INTRAMUSCULAR | Status: AC
  Filled 2018-10-03: qty 5

## 2018-10-03 MED ORDER — LIDOCAINE 2% (20 MG/ML) 5 ML SYRINGE
INTRAMUSCULAR | Status: DC | PRN
  Administered 2018-10-03: 60 mg via INTRAVENOUS

## 2018-10-03 MED ORDER — ROCURONIUM BROMIDE 10 MG/ML (PF) SYRINGE
PREFILLED_SYRINGE | INTRAVENOUS | Status: DC | PRN
  Administered 2018-10-03: 60 mg via INTRAVENOUS

## 2018-10-03 MED ORDER — FENTANYL CITRATE (PF) 100 MCG/2ML IJ SOLN
INTRAMUSCULAR | Status: DC | PRN
  Administered 2018-10-03: 100 ug via INTRAVENOUS
  Administered 2018-10-03: 50 ug via INTRAVENOUS
  Administered 2018-10-03: 100 ug via INTRAVENOUS

## 2018-10-03 MED ORDER — KETOROLAC TROMETHAMINE 30 MG/ML IJ SOLN
30.0000 mg | Freq: Four times a day (QID) | INTRAMUSCULAR | Status: DC | PRN
  Administered 2018-10-03 – 2018-10-04 (×2): 30 mg via INTRAVENOUS
  Filled 2018-10-03 (×2): qty 1

## 2018-10-03 SURGICAL SUPPLY — 47 items
APPLICATOR ARISTA FLEXITIP XL (MISCELLANEOUS) IMPLANT
APPLIER CLIP 5 13 M/L LIGAMAX5 (MISCELLANEOUS)
APPLIER CLIP ROT 10 11.4 M/L (STAPLE)
BENZOIN TINCTURE PRP APPL 2/3 (GAUZE/BANDAGES/DRESSINGS) IMPLANT
CABLE HIGH FREQUENCY MONO STRZ (ELECTRODE) ×2 IMPLANT
CHLORAPREP W/TINT 26ML (MISCELLANEOUS) ×2 IMPLANT
CLIP APPLIE 5 13 M/L LIGAMAX5 (MISCELLANEOUS) IMPLANT
CLIP APPLIE ROT 10 11.4 M/L (STAPLE) IMPLANT
CLIP VESOLOCK MED LG 6/CT (CLIP) IMPLANT
COVER MAYO STAND STRL (DRAPES) ×2 IMPLANT
COVER SURGICAL LIGHT HANDLE (MISCELLANEOUS) ×2 IMPLANT
COVER WAND RF STERILE (DRAPES) ×2 IMPLANT
DECANTER SPIKE VIAL GLASS SM (MISCELLANEOUS) ×2 IMPLANT
DERMABOND ADVANCED (GAUZE/BANDAGES/DRESSINGS) ×1
DERMABOND ADVANCED .7 DNX12 (GAUZE/BANDAGES/DRESSINGS) ×1 IMPLANT
DRAPE C-ARM 42X120 X-RAY (DRAPES) ×2 IMPLANT
DRSG TEGADERM 2-3/8X2-3/4 SM (GAUZE/BANDAGES/DRESSINGS) IMPLANT
ELECT PENCIL ROCKER SW 15FT (MISCELLANEOUS) ×2 IMPLANT
ELECT REM PT RETURN 15FT ADLT (MISCELLANEOUS) ×2 IMPLANT
GAUZE SPONGE 2X2 8PLY STRL LF (GAUZE/BANDAGES/DRESSINGS) IMPLANT
GLOVE BIO SURGEON STRL SZ7.5 (GLOVE) ×2 IMPLANT
GLOVE INDICATOR 8.0 STRL GRN (GLOVE) ×2 IMPLANT
GOWN STRL REUS W/TWL XL LVL3 (GOWN DISPOSABLE) ×8 IMPLANT
GRASPER SUT TROCAR 14GX15 (MISCELLANEOUS) IMPLANT
HEMOSTAT ARISTA ABSORB 3G PWDR (MISCELLANEOUS) IMPLANT
HEMOSTAT SNOW SURGICEL 2X4 (HEMOSTASIS) IMPLANT
KIT BASIN OR (CUSTOM PROCEDURE TRAY) ×2 IMPLANT
L-HOOK LAP DISP 36CM (ELECTROSURGICAL) ×2
LHOOK LAP DISP 36CM (ELECTROSURGICAL) ×1 IMPLANT
POUCH RETRIEVAL ECOSAC 10 (ENDOMECHANICALS) ×1 IMPLANT
POUCH RETRIEVAL ECOSAC 10MM (ENDOMECHANICALS) ×1
SCISSORS LAP 5X35 DISP (ENDOMECHANICALS) ×2 IMPLANT
SET CHOLANGIOGRAPH MIX (MISCELLANEOUS) ×2 IMPLANT
SET IRRIG TUBING LAPAROSCOPIC (IRRIGATION / IRRIGATOR) ×2 IMPLANT
SLEEVE XCEL OPT CAN 5 100 (ENDOMECHANICALS) ×4 IMPLANT
SPONGE GAUZE 2X2 STER 10/PKG (GAUZE/BANDAGES/DRESSINGS)
STRIP CLOSURE SKIN 1/2X4 (GAUZE/BANDAGES/DRESSINGS) IMPLANT
SUT MNCRL AB 4-0 PS2 18 (SUTURE) ×4 IMPLANT
SUT VICRYL 0 TIES 12 18 (SUTURE) IMPLANT
SUT VICRYL 0 UR6 27IN ABS (SUTURE) IMPLANT
TOWEL OR 17X26 10 PK STRL BLUE (TOWEL DISPOSABLE) ×2 IMPLANT
TOWEL OR NON WOVEN STRL DISP B (DISPOSABLE) ×2 IMPLANT
TRAY LAPAROSCOPIC (CUSTOM PROCEDURE TRAY) ×2 IMPLANT
TROCAR BLADELESS OPT 5 100 (ENDOMECHANICALS) ×2 IMPLANT
TROCAR XCEL BLUNT TIP 100MML (ENDOMECHANICALS) IMPLANT
TROCAR XCEL NON-BLD 11X100MML (ENDOMECHANICALS) IMPLANT
TUBING INSUF HEATED (TUBING) ×2 IMPLANT

## 2018-10-03 NOTE — Anesthesia Procedure Notes (Signed)
Procedure Name: Intubation Performed by: Gean Maidens, CRNA Pre-anesthesia Checklist: Patient identified, Emergency Drugs available, Suction available, Patient being monitored and Timeout performed Patient Re-evaluated:Patient Re-evaluated prior to induction Oxygen Delivery Method: Circle system utilized Preoxygenation: Pre-oxygenation with 100% oxygen Induction Type: IV induction Ventilation: Mask ventilation without difficulty Laryngoscope Size: Mac and 3 Grade View: Grade I Tube type: Oral Tube size: 7.0 mm Number of attempts: 1 Airway Equipment and Method: Stylet Placement Confirmation: ETT inserted through vocal cords under direct vision,  positive ETCO2 and breath sounds checked- equal and bilateral Secured at: 21 cm Tube secured with: Tape Dental Injury: Teeth and Oropharynx as per pre-operative assessment

## 2018-10-03 NOTE — Anesthesia Preprocedure Evaluation (Addendum)
Anesthesia Evaluation  Patient identified by MRN, date of birth, ID band Patient awake    Reviewed: Allergy & Precautions, NPO status , Patient's Chart, lab work & pertinent test results  History of Anesthesia Complications Negative for: history of anesthetic complications  Airway Mallampati: I  TM Distance: >3 FB Neck ROM: Full    Dental no notable dental hx. (+) Teeth Intact, Dental Advisory Given   Pulmonary former smoker,    Pulmonary exam normal breath sounds clear to auscultation       Cardiovascular negative cardio ROS Normal cardiovascular exam Rhythm:Regular Rate:Normal     Neuro/Psych Anxiety Depression negative neurological ROS     GI/Hepatic Neg liver ROS, PUD,   Endo/Other  negative endocrine ROS  Renal/GU negative Renal ROS  negative genitourinary   Musculoskeletal negative musculoskeletal ROS (+)   Abdominal   Peds  Hematology negative hematology ROS (+)   Anesthesia Other Findings   Reproductive/Obstetrics negative OB ROS                            Anesthesia Physical Anesthesia Plan  ASA: II  Anesthesia Plan: General   Post-op Pain Management:    Induction: Intravenous  PONV Risk Score and Plan: 3 and Ondansetron, Dexamethasone, Treatment may vary due to age or medical condition and Scopolamine patch - Pre-op  Airway Management Planned: Oral ETT  Additional Equipment:   Intra-op Plan:   Post-operative Plan: Extubation in OR  Informed Consent: I have reviewed the patients History and Physical, chart, labs and discussed the procedure including the risks, benefits and alternatives for the proposed anesthesia with the patient or authorized representative who has indicated his/her understanding and acceptance.   Dental advisory given  Plan Discussed with: CRNA and Surgeon  Anesthesia Plan Comments:         Anesthesia Quick Evaluation

## 2018-10-03 NOTE — Anesthesia Postprocedure Evaluation (Signed)
Anesthesia Post Note  Patient: Meghan Barry  Procedure(s) Performed: DIAGNOSTIC LAPAROSCOPY, LAPAROSCOPIC CHOLECYSTECTOMY (N/A Abdomen)     Patient location during evaluation: PACU Anesthesia Type: General Level of consciousness: awake and alert Pain management: pain level controlled Vital Signs Assessment: post-procedure vital signs reviewed and stable Respiratory status: spontaneous breathing, nonlabored ventilation and respiratory function stable Cardiovascular status: blood pressure returned to baseline and stable Postop Assessment: no apparent nausea or vomiting Anesthetic complications: no    Last Vitals:  Vitals:   10/03/18 1455 10/03/18 1500  BP: 127/72 124/71  Pulse: 81 72  Resp: 17 14  Temp: 36.6 C   SpO2: 100% 100%    Last Pain:  Vitals:   10/03/18 1455  TempSrc:   PainSc: 5                  Kaylyn Layer

## 2018-10-03 NOTE — Transfer of Care (Signed)
Immediate Anesthesia Transfer of Care Note  Patient: Meghan Barry  Procedure(s) Performed: DIAGNOSTIC LAPAROSCOPY, LAPAROSCOPIC CHOLECYSTECTOMY (N/A Abdomen)  Patient Location: PACU  Anesthesia Type:General  Level of Consciousness: awake, alert , oriented and patient cooperative  Airway & Oxygen Therapy: Patient Spontanous Breathing and Patient connected to face mask oxygen  Post-op Assessment: Report given to RN, Post -op Vital signs reviewed and stable and Patient moving all extremities  Post vital signs: Reviewed and stable  Last Vitals:  Vitals Value Taken Time  BP 127/72 10/03/2018  2:55 PM  Temp 36.6 C 10/03/2018  2:55 PM  Pulse 72 10/03/2018  2:59 PM  Resp 14 10/03/2018  2:59 PM  SpO2 100 % 10/03/2018  2:59 PM  Vitals shown include unvalidated device data.  Last Pain:  Vitals:   10/03/18 1455  TempSrc:   PainSc: (P) 5       Patients Stated Pain Goal: 2 (10/03/18 1002)  Complications: No apparent anesthesia complications

## 2018-10-03 NOTE — Interval H&P Note (Signed)
History and Physical Interval Note:  10/03/2018 12:46 PM  Meghan Barry  has presented today for surgery, with the diagnosis of RIGHT UPPER QUADRANT PAIN  The various methods of treatment have been discussed with the patient and family. After consideration of risks, benefits and other options for treatment, the patient has consented to  Procedure(s): DIAGNOSTIC LAPAROSCOPY, LAPAROSCOPIC CHOLECYSTECTOMY WITH INTRAOPERATIVE CHOLANGIOGRAM (N/A) as a surgical intervention .  The patient's history has been reviewed, patient examined, no change in status, stable for surgery.  I have reviewed the patient's chart and labs.  Questions were answered to the patient's satisfaction.    Mary Sella. Andrey Campanile, MD, FACS General, Bariatric, & Minimally Invasive Surgery [facility] [place]  Gaynelle Adu

## 2018-10-03 NOTE — Op Note (Signed)
Meghan Barry [id number] Aug 29, 1981 10/03/2018  Laparoscopic Cholecystectomy Procedure Note  Indications: This patient presents with RUQ pain and will undergo laparoscopic cholecystectomy.  The patient has had persistent right upper quadrant pain rating to her back and shoulder for several years.  She has had exhaustive work-up including upper endoscopy recently, CT scan, nuclear medicine scan of her gallbladder.  All of which were unremarkable.  She presented for worsening right upper quadrant pain and was admitted.  She was taken to the operating room for cholecystectomy to see if this was potential source of her discomfort.  Please see chart for additional details.  Pre-operative Diagnosis: Abdominal pain, right upper quadrant  Post-operative Diagnosis: Abdominal pain, right upper quadrant; Meghan Barry Syndrome  Surgeon: Gaynelle Adu MD  Assistants: Bailey Mech PA-C  Anesthesia: General endotracheal anesthesia + bilateral laparoscopic marcaine/exparel TAP block  Procedure Details  The patient was seen again in the Holding Room. The risks, benefits, complications, treatment options, and expected outcomes were discussed with the patient. The possibilities of reaction to medication, pulmonary aspiration, perforation of viscus, bleeding, recurrent infection, finding a normal gallbladder, the need for additional procedures, failure to diagnose a condition, the possible need to convert to an open procedure, and creating a complication requiring transfusion or operation were discussed with the patient. The likelihood of improving the patient's symptoms with return to their baseline status is good.  The patient and/or family concurred with the proposed plan, giving informed consent. The site of surgery properly noted. The patient was taken to Operating Room, identified as Meghan Barry and the procedure verified as Laparoscopic Cholecystectomy with Intraoperative Cholangiogram. A Time Out was held and the  above information confirmed. Antibiotic prophylaxis was administered.   Prior to the induction of general anesthesia, antibiotic prophylaxis was administered. General endotracheal anesthesia was then administered and tolerated well. After the induction, the abdomen was prepped with Chloraprep and draped in the sterile fashion. The patient was positioned in the supine position.  Local anesthetic agent was injected into the skin near the umbilicus and an incision made through an old incision.. We dissected down to the abdominal fascia with blunt dissection.  The fascia was incised vertically and we entered the peritoneal cavity bluntly.  A pursestring suture of 0-Vicryl was placed around the fascial opening.  The Hasson cannula was inserted and secured with the stay suture.  Pneumoperitoneum was then created with CO2 and tolerated well without any adverse changes in the patient's vital signs. An 5-mm port was placed in the subxiphoid position.  Two 5-mm ports were placed in the right upper quadrant. All skin incisions were infiltrated with a local anesthetic agent before making the incision and placing the trocars.   We positioned the patient in reverse Trendelenburg, tilted slightly to the patient's left.  The patient had thin violins drains of tissue between the right hepatic lobe and peritoneum throughout the right hepatic lobe area.  Pictures were taken.  I ended up taking down these adhesions between the right lobe of the liver and the peritoneum using a combination of Endo Shears as well as hook electrocautery.  The gallbladder was identified, the fundus grasped and retracted cephalad.  The gallbladder appeared very normal.  Adhesions were lysed bluntly and with the electrocautery where indicated, taking care not to injure any adjacent organs or viscus. The infundibulum was grasped and retracted laterally, exposing the peritoneum overlying the triangle of Calot. This was then divided and exposed in a blunt  fashion. A critical view of the  cystic duct and cystic artery was obtained.  The cystic duct was clearly identified and bluntly dissected circumferentially. The cystic duct was ligated with a clip distally.  The cystic duct was tiny.  An incision was made in the cystic duct and the [facility] cholangiogram catheter was attempted to be introduced.  I could not get the catheter into the cystic duct.  Therefore I decided not to proceed with a cholangiogram.  The cholangiogram catheter was removed.   The cystic duct was then ligated with clips and divided. The cystic artery which had been identified & dissected free was ligated with clips and divided as well.   The gallbladder was dissected from the liver bed in retrograde fashion with the electrocautery. The gallbladder was removed and placed in an Ecco sac.  The gallbladder and Ecco sac were then removed through the umbilical port site. The liver bed was irrigated and inspected. Hemostasis was achieved with the electrocautery. Copious irrigation was utilized and was repeatedly aspirated until clear.  The right upper quadrant appeared normal.  The hepatic flexure appeared grossly normal.  The distal stomach and proximal small bowel also appeared normal.  There is no evidence of inguinal hernias in either groin.  The bladder was a little bit distended which obscured full visualization of the pelvic structures.  There is no gross abnormality along the right paracolic gutter.  We infiltrated a mixture of Marcaine with Exparel along both lateral abdominal walls as a tap block.  This was done under laparoscopic visualization.  The pursestring suture was used to close the umbilical fascia.    We again inspected the right upper quadrant for hemostasis.  The umbilical closure was inspected and there was no air leak and nothing trapped within the closure. Pneumoperitoneum was released as we removed the trocars.  4-0 Monocryl was used to close the skin.   Dermabond was applied.  The patient was then extubated and brought to the recovery room in stable condition. Instrument, sponge, and needle counts were correct at closure and at the conclusion of the case.   Findings: Normal-appearing gallbladder; adhesions between the right lobe of the liver and the peritoneum consistent with Meghan Barry syndrome which were taken down.  Estimated Blood Loss: Minimal         Drains: nonee         Specimens: Gallbladder           Complications: None; patient tolerated the procedure well.         Disposition: PACU - hemodynamically stable.         Condition: stable  Mary Sella. Andrey Campanile, MD, FACS General, Bariatric, & Minimally Invasive Surgery [facility], [place]

## 2018-10-04 ENCOUNTER — Encounter (HOSPITAL_COMMUNITY): Payer: Self-pay | Admitting: General Surgery

## 2018-10-04 MED ORDER — OXYCODONE HCL 5 MG PO TABS: 5.0000 mg | ORAL_TABLET | ORAL | 0 refills | Status: DC | PRN

## 2018-10-04 MED ORDER — ACETAMINOPHEN 325 MG PO TABS: 650.0000 mg | ORAL_TABLET | Freq: Four times a day (QID) | ORAL | Status: DC | PRN

## 2018-10-04 MED ORDER — FAMOTIDINE 20 MG PO TABS
20.0000 mg | ORAL_TABLET | Freq: Two times a day (BID) | ORAL | Status: DC
  Administered 2018-10-04: 20 mg via ORAL
  Filled 2018-10-04: qty 1

## 2018-10-04 NOTE — Discharge Instructions (Signed)
[facility] °LAPAROSCOPIC SURGERY: POST OP INSTRUCTIONS °Always review your discharge instruction sheet given to you by the facility where your surgery was performed. °IF YOU HAVE DISABILITY OR FAMILY LEAVE FORMS, YOU MUST BRING THEM TO THE OFFICE FOR PROCESSING.   °DO NOT GIVE THEM TO YOUR DOCTOR. ° °PAIN CONTROL ° °1. First take acetaminophen (Tylenol) AND/or ibuprofen (Advil) to control your pain after surgery.  Follow directions on package.  Taking acetaminophen (Tylenol) and/or ibuprofen (Advil) regularly after surgery will help to control your pain and lower the amount of prescription pain medication you may need.  You should not take more than 4,000 mg (4 grams) of acetaminophen (Tylenol) in 24 hours.  You should not take ibuprofen (Advil), aleve, motrin, naprosyn or other NSAIDS if you have a history of stomach ulcers or chronic kidney disease.  °2. A prescription for pain medication may be given to you upon discharge.  Take your pain medication as prescribed, if you still have uncontrolled pain after taking acetaminophen (Tylenol) or ibuprofen (Advil). °3. Use ice packs to help control pain. °4. If you need a refill on your pain medication, please contact your pharmacy.  They will contact our office to request authorization. Prescriptions will not be filled after 5pm or on week-ends. ° °HOME MEDICATIONS °5. Take your usually prescribed medications unless otherwise directed. ° °DIET °6. You should follow a light diet the first few days after arrival home.  Be sure to include lots of fluids daily. Avoid fatty, fried foods.  ° °CONSTIPATION °7. It is common to experience some constipation after surgery and if you are taking pain medication.  Increasing fluid intake and taking a stool softener (such as Colace) will usually help or prevent this problem from occurring.  A mild laxative (Milk of Magnesia or Miralax) should be taken according to package instructions if there are no bowel  movements after 48 hours. ° °WOUND/INCISION CARE °8. Most patients will experience some swelling and bruising in the area of the incisions.  Ice packs will help.  Swelling and bruising can take several days to resolve.  °9. Unless discharge instructions indicate otherwise, follow guidelines below  °a. STERI-STRIPS - you may remove your outer bandages 48 hours after surgery, and you may shower at that time.  You have steri-strips (small skin tapes) in place directly over the incision.  These strips should be left on the skin for 7-10 days.   °b. DERMABOND/SKIN GLUE - you may shower in 24 hours.  The glue will flake off over the next 2-3 weeks. °10. Any sutures or staples will be removed at the office during your follow-up visit. ° °ACTIVITIES °11. You may resume regular (light) daily activities beginning the next day--such as daily self-care, walking, climbing stairs--gradually increasing activities as tolerated.  You may have sexual intercourse when it is comfortable.  Refrain from any heavy lifting or straining until approved by your doctor. °a. You may drive when you are no longer taking prescription pain medication, you can comfortably wear a seatbelt, and you can safely maneuver your car and apply brakes. ° °FOLLOW-UP °12. You should see your doctor in the office for a follow-up appointment approximately 2-3 weeks after your surgery.  You should have been given your post-op/follow-up appointment when your surgery was scheduled.  If you did not receive a post-op/follow-up appointment, make sure that you call for this appointment within a day or two after you arrive home to insure a convenient appointment time. ° °OTHER   INSTRUCTIONS °13.  ° °WHEN TO CALL YOUR DOCTOR: °1. Fever over 101.0 °2. Inability to urinate °3. Continued bleeding from incision. °4. Increased pain, redness, or drainage from the incision. °5. Increasing abdominal pain ° °The clinic staff is available to answer your questions during regular  business hours.  Please don’t hesitate to call and ask to speak to one of the nurses for clinical concerns.  If you have a medical emergency, go to the nearest emergency room or call 911.  A surgeon from [facility] is always on call at the hospital. °[address], [address], [place], [place]  [address] ? [address] [address], , [place]   [address] °[phone] ? [phone] ? FAX [phone] °Web site: [email or web address] ° °

## 2018-10-04 NOTE — Discharge Summary (Signed)
[facility] Discharge Summary   Patient ID: Meghan Barry MRN: [id number] DOB/AGE: March 06, 1981 37 y.o.  Admit date: 10/02/2018 Discharge date: 10/04/2018   Discharge Diagnosis Patient Active Problem List   Diagnosis Date Noted  . Cholecystitis 10/02/2018  . Stomach ulcer   . Gastritis 08/01/2018  . Abdominal pain 07/30/2018  . Anxiety 07/30/2018  . Depression 07/30/2018  . Peptic ulcer 07/30/2018   Imaging: Dg Chest 2 View  Result Date: 10/02/2018 CLINICAL DATA:  Severe right-sided abdominal pain. EXAM: CHEST - 2 VIEW COMPARISON:  09/06/2016 FINDINGS: The heart size and mediastinal contours are within normal limits. Both lungs are clear. No pleural effusion or pneumothorax. The visualized skeletal structures are unremarkable. IMPRESSION: No active cardiopulmonary disease. Electronically Signed   By: Amie Portland M.D.   On: 10/02/2018 12:28   US Abdomen Limited Ruq  Result Date: 10/02/2018 CLINICAL DATA:  Right upper quadrant abdominal pain EXAM: ULTRASOUND ABDOMEN LIMITED RIGHT UPPER QUADRANT COMPARISON:  Abdominal ultrasound of July 30, 2018 FINDINGS: Gallbladder: The gallbladder is adequately distended with no evidence of stones, wall thickening, or pericholecystic fluid. There is no positive sonographic Murphy's sign. Common bile duct: Diameter: 3.1 mm Liver: The hepatic echotexture is normal. The surface contour is smooth. There is no focal mass or ductal dilation. Evaluation of the portal vein was somewhat difficult due to the patient's pain and inability to lie still. There appears to be bidirectional flow within the portal vein. IMPRESSION: Normal appearance of the gallbladder and common bile duct. No hepatic parenchymal abnormalities. Apparent bidirectional flow within the portal vein may indicate mild portal hypertension. Electronically Signed   By: David  [place] M.D.   On: 10/02/2018 13:07    Procedures Dr. Gaynelle Adu (10/03/2018)-diagnostic laparoscopy, lysis of  adhesions, laparoscopic cholecystectomy  Hospital Course:  37 year old female who presented to [facility] emergency department with worsening right upper quadrant pain associated with nausea and vomiting.  Patient has an extensive history of upper abdominal pain dating back to 2015.  Recent hospital admission in August for the same issue.  Had an extensive work-up including CT angios of the chest and abdomen, right upper quadrant ultrasound, upper endoscopy, and hepatobiliary scan all of which were negative for gallbladder disease, peptic ulcer disease, or other acute intra-abdominal pathology in the chest or abdomen.  Due to severe, persistent pain and inability to tolerate p.o. intake the patient was admitted to the hospital.  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  The operation was performed without complications and the patient was admitted to the MedSurg floor.  On postoperative day #1 the patient's vitals were stable, her pain was improving, tolerating oral intake, urinating without issue, mobilizing and medically stable for discharge home.  She will require outpatient follow-up in our office as below and already has plans to follow-up with her gynecologist Dr. Katherine Roan in November.  She knows to call with questions or concerns.  Postoperative instructions were discussed with the patient and she voiced understanding.  Physical Exam: General:  Alert, NAD, pleasant, comfortable Abd:  Soft, appropriate incisional tenderness, nondistended, incisions clean and dry  Allergies as of 10/04/2018      Reactions   Ciprofloxacin Hives   flushed face   Bactrim [sulfamethoxazole-trimethoprim] Other (See Comments)   Mouth sores, stiff neck and H/A   Codeine Other (See Comments)   Flushed red face, migraines   Dilaudid [hydromorphone Hcl]    migraine   Lunesta [eszopiclone] Other (See Comments)  hallucinations   Morphine And Related Other (See Comments)    Severe migraine   Amoxicillin Rash   Has patient had a PCN reaction causing immediate rash, facial/tongue/throat swelling, SOB or lightheadedness with hypotension: Yes Has patient had a PCN reaction causing severe rash involving mucus membranes or skin necrosis: No Has patient had a PCN reaction that required hospitalization: No Has patient had a PCN reaction occurring within the last 10 years: No If all of the above answers are "NO", then may proceed with Cephalosporin use.   Penicillins Rash   Has patient had a PCN reaction causing immediate rash, facial/tongue/throat swelling, SOB or lightheadedness with hypotension: Yes Has patient had a PCN reaction causing severe rash involving mucus membranes or skin necrosis: No Has patient had a PCN reaction that required hospitalization: No Has patient had a PCN reaction occurring within the last 10 years: No If all of the above answers are "NO", then may proceed with Cephalosporin use.      Medication List    STOP taking these medications   doxycycline 50 MG capsule Commonly known as:  VIBRAMYCIN     TAKE these medications   acetaminophen 325 MG tablet Commonly known as:  TYLENOL Take 2 tablets (650 mg total) by mouth every 6 (six) hours as needed. What changed:    medication strength  how much to take  reasons to take this   AMITIZA 24 MCG capsule Generic drug:  lubiprostone Take 24 mcg by mouth daily.   amphetamine-dextroamphetamine 10 MG tablet Commonly known as:  ADDERALL Take 10 mg by mouth daily as needed (concentration).   buPROPion 150 MG 12 hr tablet Commonly known as:  WELLBUTRIN SR Take 150 mg by mouth 2 (two) times daily.   clonazePAM 1 MG tablet Commonly known as:  KLONOPIN Take 1 mg by mouth at bedtime.   multivitamin capsule Take 1 capsule by mouth daily.   NON FORMULARY Place 1 application vaginally at bedtime as needed (pain). Lidocaine 5% vaginal ointment   omeprazole 40 MG capsule Commonly  known as:  PRILOSEC Take 40 mg by mouth 2 (two) times daily.   oxyCODONE 5 MG immediate release tablet Commonly known as:  Oxy IR/ROXICODONE Take 1 tablet (5 mg total) by mouth every 4 (four) hours as needed for moderate pain.   polyethylene glycol packet Commonly known as:  MIRALAX / GLYCOLAX Take 17 g by mouth daily as needed for mild constipation.   promethazine 25 MG tablet Commonly known as:  PHENERGAN Take 1 tablet by mouth every 8 (eight) hours as needed for nausea or vomiting.   sertraline 100 MG tablet Commonly known as:  ZOLOFT Take 200 mg by mouth at bedtime.   VITAMIN D-1000 MAX ST 1000 units tablet Generic drug:  Cholecalciferol Take 1,000 Units by mouth daily.   zolpidem 6.25 MG CR tablet Commonly known as:  AMBIEN CR Take 6.25 mg by mouth at bedtime.        Follow-up Information    [facility], [place]. Go on 10/22/2018.   Specialty:  General Surgery Why:  at 9:00 AM for post-operative follow up. please arrive 30 minutes early to get checked in and fill out any necessary paperwork. Contact information: [address] [place] [address] [phone]          Signed: Hosie Spangle, [facility] 10/04/2018, 9:25 AM

## 2018-10-16 ENCOUNTER — Other Ambulatory Visit (HOSPITAL_COMMUNITY)

## 2018-10-18 ENCOUNTER — Ambulatory Visit: Admit: 2018-10-18 | Admitting: Surgery

## 2019-01-09 ENCOUNTER — Other Ambulatory Visit: Payer: Self-pay | Admitting: Obstetrics and Gynecology

## 2019-01-09 DIAGNOSIS — N644 Mastodynia: Secondary | ICD-10-CM

## 2019-01-14 ENCOUNTER — Ambulatory Visit
Admission: RE | Admit: 2019-01-14 | Discharge: 2019-01-14 | Disposition: A | Discharge status: Home / Self Care | Source: Ambulatory Visit | Attending: Obstetrics and Gynecology | Admitting: Obstetrics and Gynecology

## 2019-01-14 DIAGNOSIS — N644 Mastodynia: Secondary | ICD-10-CM

## 2020-06-14 ENCOUNTER — Other Ambulatory Visit: Payer: Self-pay | Admitting: Sports Medicine

## 2020-06-14 DIAGNOSIS — M542 Cervicalgia: Secondary | ICD-10-CM

## 2020-06-21 ENCOUNTER — Other Ambulatory Visit: Payer: Self-pay

## 2020-06-21 ENCOUNTER — Ambulatory Visit
Admission: RE | Admit: 2020-06-21 | Discharge: 2020-06-21 | Disposition: A | Discharge status: Home / Self Care | Source: Ambulatory Visit | Attending: Sports Medicine | Admitting: Sports Medicine

## 2020-06-21 DIAGNOSIS — M542 Cervicalgia: Secondary | ICD-10-CM

## 2020-06-21 MED ORDER — TRIAMCINOLONE ACETONIDE 40 MG/ML IJ SUSP (RADIOLOGY)
60.0000 mg | Freq: Once | INTRAMUSCULAR | Status: AC
  Administered 2020-06-21: 60 mg via EPIDURAL

## 2020-06-21 MED ORDER — IOPAMIDOL (ISOVUE-M 300) INJECTION 61%
1.0000 mL | Freq: Once | INTRAMUSCULAR | Status: AC | PRN
  Administered 2020-06-21: 1 mL via EPIDURAL

## 2020-06-21 NOTE — Discharge Instructions (Signed)

## 2020-07-30 ENCOUNTER — Other Ambulatory Visit: Payer: Self-pay | Admitting: Sports Medicine

## 2020-07-30 DIAGNOSIS — M542 Cervicalgia: Secondary | ICD-10-CM

## 2020-08-04 ENCOUNTER — Ambulatory Visit
Admission: RE | Admit: 2020-08-04 | Discharge: 2020-08-04 | Disposition: A | Discharge status: Home / Self Care | Source: Ambulatory Visit | Attending: Sports Medicine | Admitting: Sports Medicine

## 2020-08-04 ENCOUNTER — Other Ambulatory Visit: Payer: Self-pay

## 2020-08-04 DIAGNOSIS — M542 Cervicalgia: Secondary | ICD-10-CM

## 2020-08-04 MED ORDER — IOPAMIDOL (ISOVUE-M 300) INJECTION 61%
1.0000 mL | Freq: Once | INTRAMUSCULAR | Status: AC
  Administered 2020-08-04: 1 mL via EPIDURAL

## 2020-08-04 MED ORDER — TRIAMCINOLONE ACETONIDE 40 MG/ML IJ SUSP (RADIOLOGY)
60.0000 mg | Freq: Once | INTRAMUSCULAR | Status: AC
  Administered 2020-08-04: 60 mg via EPIDURAL

## 2020-08-04 NOTE — Discharge Instructions (Signed)

## 2020-10-09 ENCOUNTER — Ambulatory Visit (INDEPENDENT_AMBULATORY_CARE_PROVIDER_SITE_OTHER)

## 2020-10-09 ENCOUNTER — Encounter (HOSPITAL_COMMUNITY): Payer: Self-pay | Admitting: Emergency Medicine

## 2020-10-09 ENCOUNTER — Ambulatory Visit (HOSPITAL_COMMUNITY): Admission: EM | Admit: 2020-10-09 | Discharge: 2020-10-09 | Disposition: A | Discharge status: Home / Self Care

## 2020-10-09 ENCOUNTER — Other Ambulatory Visit: Payer: Self-pay

## 2020-10-09 DIAGNOSIS — S61412A Laceration without foreign body of left hand, initial encounter: Secondary | ICD-10-CM

## 2020-10-09 DIAGNOSIS — M25642 Stiffness of left hand, not elsewhere classified: Secondary | ICD-10-CM

## 2020-10-09 DIAGNOSIS — S6992XA Unspecified injury of left wrist, hand and finger(s), initial encounter: Secondary | ICD-10-CM | POA: Diagnosis not present

## 2020-10-09 MED ORDER — KETOROLAC TROMETHAMINE 30 MG/ML IJ SOLN
30.0000 mg | Freq: Once | INTRAMUSCULAR | Status: AC
  Administered 2020-10-09: 30 mg via INTRAMUSCULAR

## 2020-10-09 MED ORDER — HYDROCODONE-ACETAMINOPHEN 5-325 MG PO TABS
1.0000 | ORAL_TABLET | Freq: Once | ORAL | Status: AC
  Administered 2020-10-09: 1 via ORAL

## 2020-10-09 MED ORDER — KETOROLAC TROMETHAMINE 30 MG/ML IJ SOLN
INTRAMUSCULAR | Status: AC
  Filled 2020-10-09: qty 1

## 2020-10-09 MED ORDER — HYDROCODONE-ACETAMINOPHEN 5-325 MG PO TABS: 2.0000 | ORAL_TABLET | ORAL | 0 refills | Status: DC | PRN

## 2020-10-09 MED ORDER — CLINDAMYCIN HCL 300 MG PO CAPS: 300.0000 mg | ORAL_CAPSULE | Freq: Three times a day (TID) | ORAL | 0 refills | Status: AC

## 2020-10-09 MED ORDER — HYDROCODONE-ACETAMINOPHEN 5-325 MG PO TABS
ORAL_TABLET | ORAL | Status: AC
  Filled 2020-10-09: qty 1

## 2020-10-09 MED ORDER — LIDOCAINE HCL (PF) 2 % IJ SOLN
INTRAMUSCULAR | Status: AC
  Filled 2020-10-09: qty 5

## 2020-10-09 NOTE — Progress Notes (Signed)
Orthopedic Tech Progress Note Patient Details:  Meghan Barry [facility] May 06, 1981 [id number]  Ortho Devices Type of Ortho Device: Thumb spica splint Ortho Device/Splint Location: URE Ortho Device/Splint Interventions: Ordered, Application   Post Interventions Patient Tolerated: Well Instructions Provided: Care of device   Kelli Egolf A Corynne Scibilia 10/09/2020, 7:14 PM

## 2020-10-09 NOTE — ED Provider Notes (Signed)
[facility]    CSN: [id number] Arrival date & time: 10/09/20  1514      History   Chief Complaint Chief Complaint  Patient presents with  . Laceration    HPI Meghan Barry is a 39 y.o. female presenting today for evaluation of left hand laceration. Patient was carving a pumpkin with a kitchen carving knife and knife struck her left hand in the 1st interdigital webspace. Patient reports she felt the knife go approximately 1.5 in into her hand towards her thumb. Patient presented immediately here afterwards. She reports she is having difficulty moving her thumb. Also reports numbness sensation radiating into her wrist. Reports prior cyst and "reconstruction" surgery of her left wrist. Last tetanus approximately 2 years ago.  HPI  Past Medical History:  Diagnosis Date  . Anxiety   . Depression   . Stomach ulcer   . Venous insufficiency     Patient Active Problem List   Diagnosis Date Noted  . Cholecystitis 10/02/2018  . Stomach ulcer   . Gastritis 08/01/2018  . Abdominal pain 07/30/2018  . Anxiety 07/30/2018  . Depression 07/30/2018  . Peptic ulcer 07/30/2018    Past Surgical History:  Procedure Laterality Date  . ABDOMINAL HYSTERECTOMY    . anterior posterior vaginal wall repair    . BIOPSY  08/01/2018   Procedure: BIOPSY;  Surgeon: Lynann Bologna, MD;  Location: [facility] ENDOSCOPY;  Service: Endoscopy;;  . bladder tact    . BREAST CYST ASPIRATION Right   . CHOLECYSTECTOMY N/A 10/03/2018   Procedure: DIAGNOSTIC LAPAROSCOPY, LAPAROSCOPIC CHOLECYSTECTOMY;  Surgeon: Gaynelle Adu, MD;  Location: [facility] ORS;  Service: General;  Laterality: N/A;  . DILATION AND CURETTAGE OF UTERUS    . ESOPHAGOGASTRODUODENOSCOPY N/A 08/01/2018   Procedure: ESOPHAGOGASTRODUODENOSCOPY (EGD);  Surgeon: Lynann Bologna, MD;  Location: [facility] ENDOSCOPY;  Service: Endoscopy;  Laterality: N/A;  . left ovary removal    . OVARIAN CYST SURGERY    . TONSILLECTOMY    . WRIST SURGERY      OB  History    Gravida  5   Para  2   Term  2   Preterm      AB  3   Living  2     SAB  3   TAB      Ectopic      Multiple      Live Births  2            Home Medications    Prior to Admission medications   Medication Sig Start Date End Date Taking? Authorizing Provider  buPROPion (WELLBUTRIN SR) 150 MG 12 hr tablet Take 150 mg by mouth 2 (two) times daily.   Yes [provider]  omeprazole (PRILOSEC) 40 MG capsule Take 40 mg by mouth 2 (two) times daily. 09/24/18  Yes [provider]  acetaminophen (TYLENOL) 325 MG tablet Take 2 tablets (650 mg total) by mouth every 6 (six) hours as needed. 10/04/18   Adam Phenix, PA-C  AMITIZA 24 MCG capsule Take 24 mcg by mouth daily. 07/25/18   [provider]  Cholecalciferol (VITAMIN D-1000 MAX ST) 1000 units tablet Take 1,000 Units by mouth daily.    [provider]  clindamycin (CLEOCIN) 300 MG capsule Take 1 capsule (300 mg total) by mouth 3 (three) times daily for 7 days. 10/09/20 10/16/20  Dorwin Fitzhenry C, PA-C  HYDROcodone-acetaminophen (NORCO/VICODIN) 5-325 MG tablet Take 2 tablets by mouth every 4 (four) hours as needed. 10/09/20  George Alcantar C, PA-C  Multiple Vitamin (MULTIVITAMIN) capsule Take 1 capsule by mouth daily.    [provider]  NON FORMULARY Place 1 application vaginally at bedtime as needed (pain). Lidocaine 5% vaginal ointment    [provider]  polyethylene glycol (MIRALAX / GLYCOLAX) packet Take 17 g by mouth daily as needed for mild constipation.     [provider]  VYVANSE 30 MG capsule Take 30 mg by mouth every morning. 10/05/20   [provider]  amphetamine-dextroamphetamine (ADDERALL) 10 MG tablet Take 10 mg by mouth daily as needed (concentration).  10/09/20  [provider]  clonazePAM (KLONOPIN) 1 MG tablet Take 1 mg by mouth at bedtime.   10/09/20  [provider]  sertraline (ZOLOFT) 100 MG tablet  Take 200 mg by mouth at bedtime.   10/09/20  [provider]  zolpidem (AMBIEN CR) 6.25 MG CR tablet Take 6.25 mg by mouth at bedtime.  10/09/20  [provider]    Family History Family History  Problem Relation Age of Onset  . Cancer Sister   . Breast cancer Maternal Grandmother     Social History Social History   Tobacco Use  . Smoking status: Former Smoker    Quit date: 12/25/2013    Years since quitting: 6.7  . Smokeless tobacco: Never Used  . Tobacco comment: quit smoking 2015  Vaping Use  . Vaping Use: Never used  Substance Use Topics  . Alcohol use: No  . Drug use: No     Allergies   Bactrim [sulfamethoxazole-trimethoprim], Ciprofloxacin, Codeine, Dilaudid [hydromorphone hcl], Lunesta [eszopiclone], Morphine and related, Amoxicillin, and Penicillins   Review of Systems Review of Systems  Constitutional: Negative for fatigue and fever.  Eyes: Negative for visual disturbance.  Respiratory: Negative for shortness of breath.   Cardiovascular: Negative for chest pain.  Gastrointestinal: Negative for abdominal pain, nausea and vomiting.  Musculoskeletal: Positive for joint swelling. Negative for arthralgias.  Skin: Positive for wound. Negative for color change and rash.  Neurological: Negative for dizziness, weakness, light-headedness and headaches.     Physical Exam Triage Vital Signs ED Triage Vitals  Enc Vitals Group     BP 10/09/20 1532 116/74     Pulse Rate 10/09/20 1532 66     Resp 10/09/20 1532 16     Temp 10/09/20 1532 98.4 F (36.9 C)     Temp Source 10/09/20 1532 Oral     SpO2 10/09/20 1532 100 %     Weight --      Height --      Head Circumference --      Peak Flow --      Pain Score 10/09/20 1530 8     Pain Loc --      Pain Edu? --      Excl. in GC? --    No data found.  Updated Vital Signs BP 116/74 (BP Location: Right Arm)   Pulse 66   Temp 98.4 F (36.9 C) (Oral)   Resp 16   SpO2 100%   Visual Acuity Right  Eye Distance:   Left Eye Distance:   Bilateral Distance:    Right Eye Near:   Left Eye Near:    Bilateral Near:     Physical Exam Vitals and nursing note reviewed.  Constitutional:      Appearance: She is well-developed.     Comments: No acute distress  HENT:     Head: Normocephalic and atraumatic.  Nose: Nose normal.  Eyes:     Conjunctiva/sclera: Conjunctivae normal.  Cardiovascular:     Rate and Rhythm: Normal rate.  Pulmonary:     Effort: Pulmonary effort is normal. No respiratory distress.  Abdominal:     General: There is no distension.  Musculoskeletal:        General: Normal range of motion.     Cervical back: Neck supple.     Comments: Left hand: Full active range of motion of 2nd through 5th fingers  Patient only able to minimally move distal tip of left thumb, unable to laterally move thumb or do any opposition, no active range of motion at interphalangeal joint Cap refill less than 2 s  Skin:    General: Skin is warm and dry.     Comments: 1 cm linear laceration to interdigital space on dorsum of hand, subcutaneous fat noted in wound, no tendon visualized within the area, thenar eminence with circular area of erythema/purple discoloration  Neurological:     Mental Status: She is alert and oriented to person, place, and time.          UC Treatments / Results  Labs (all labs ordered are listed, but only abnormal results are displayed) Labs Reviewed - No data to display  EKG   Radiology DG Hand Complete Left  Result Date: 10/09/2020 CLINICAL DATA:  Injury with knife EXAM: LEFT HAND - COMPLETE 3+ VIEW COMPARISON:  None. FINDINGS: Frontal, oblique, and lateral views were obtained. No fracture or dislocation. No joint space narrowing or erosion. A ring is present overlying the fourth proximal phalanx. Beyond this ring, there is no other radiopaque foreign body. There are foci of soft tissue air between the first and second metacarpals. IMPRESSION: Soft  tissue air between the first and second metacarpals. No radiopaque foreign body in this area. No fracture or dislocation. No appreciable arthropathy. Electronically Signed   By: Bretta Bang III M.D.   On: 10/09/2020 16:43    Procedures Laceration Repair  Date/Time: 10/09/2020 6:44 PM Performed by: Jacky Dross, [place] C, PA-C Authorized by: Gabrien Mentink, Junius Creamer, PA-C   Consent:    Consent obtained:  Verbal   Consent given by:  Patient   Risks discussed:  Pain, poor cosmetic result, need for additional repair and infection   Alternatives discussed:  No treatment Anesthesia (see MAR for exact dosages):    Anesthesia method:  Local infiltration   Local anesthetic:  Lidocaine 2% w/o epi Laceration details:    Location:  Hand   Hand location:  L hand, dorsum   Length (cm):  1.3 Repair type:    Repair type:  Simple Pre-procedure details:    Preparation:  Patient was prepped and draped in usual sterile fashion Exploration:    Hemostasis achieved with:  Direct pressure   Wound exploration: wound explored through full range of motion     Wound extent: no underlying fracture noted   Treatment:    Area cleansed with:  Shur-Clens   Amount of cleaning:  Standard   Irrigation solution:  Sterile water   Irrigation volume:  500   Irrigation method:  Syringe   Visualized foreign bodies/material removed: no   Skin repair:    Repair method:  Sutures   Suture size:  4-0   Suture material:  Prolene   Suture technique:  Simple interrupted   Number of sutures:  1 Approximation:    Approximation:  Loose Post-procedure details:    Dressing:  Non-adherent dressing  Patient tolerance of procedure:  Tolerated well, no immediate complications   (including critical care time)  Medications Ordered in UC Medications  ketorolac (TORADOL) 30 MG/ML injection 30 mg (30 mg Intramuscular Given 10/09/20 1722)  HYDROcodone-acetaminophen (NORCO/VICODIN) 5-325 MG per tablet 1 tablet (1 tablet Oral Given  10/09/20 1722)    Initial Impression / Assessment and Plan / UC Course  I have reviewed the triage vital signs and the nursing notes.  Pertinent labs & imaging results that were available during my care of the patient were reviewed by me and considered in my medical decision making (see chart for details).     X-ray negative for any acute fractures or avulsions suggestive of tendon injury, given decreased mobility of thumb discussed case with Dr. Glenford Peersreighton/hand on-call who recommended loosely sewing wound and placing in thumb spica for immobilization, follow-up on Tuesday for further evaluation. Wound edges reapproximated with 1 suture loosely, placed in thumb spica; Tylenol and ibuprofen for mild to moderate pain, hydrocodone for severe pain, placing on clindamycin for prophylaxis. Contact provided for follow-up.  Discussed strict return precautions. Patient verbalized understanding and is agreeable with plan.  Final Clinical Impressions(s) / UC Diagnoses   Final diagnoses:  Laceration of left hand without foreign body, initial encounter  Decreased range of motion of left thumb     Discharge Instructions     Follow up with dr. Roney Mansreighton on Tuesday- call Monday morning for follow up Use anti-inflammatories for pain/swelling. You may take up to 800 mg Ibuprofen every 8 hours with food. You may supplement Ibuprofen with Tylenol [phone] mg every 8 hours.  Hydrocodone for severe pain    ED Prescriptions    Medication Sig Dispense Auth. Provider   HYDROcodone-acetaminophen (NORCO/VICODIN) 5-325 MG tablet Take 2 tablets by mouth every 4 (four) hours as needed. 10 tablet Lamin Chandley C, PA-C   clindamycin (CLEOCIN) 300 MG capsule Take 1 capsule (300 mg total) by mouth 3 (three) times daily for 7 days. 21 capsule Abdurrahman Petersheim, HildebranHallie C, PA-C     I have reviewed the PDMP during this encounter.   Lew DawesWieters, Bray Vickerman C, [place]PA-C 10/09/20 1845

## 2020-10-09 NOTE — Discharge Instructions (Addendum)
Follow up with dr. Roney Mans on Tuesday- call Monday morning for follow up Use anti-inflammatories for pain/swelling. You may take up to 800 mg Ibuprofen every 8 hours with food. You may supplement Ibuprofen with Tylenol [phone] mg every 8 hours.  Hydrocodone for severe pain

## 2020-10-09 NOTE — ED Triage Notes (Signed)
Pt presents with hand laceration, states was carving pumpkin and carving knife went into hand about 1.5 inch.

## 2020-10-29 ENCOUNTER — Encounter (HOSPITAL_COMMUNITY): Payer: Self-pay | Admitting: Emergency Medicine

## 2020-10-29 ENCOUNTER — Emergency Department (HOSPITAL_COMMUNITY)

## 2020-10-29 ENCOUNTER — Other Ambulatory Visit: Payer: Self-pay

## 2020-10-29 ENCOUNTER — Emergency Department (HOSPITAL_COMMUNITY)
Admission: EM | Admit: 2020-10-29 | Discharge: 2020-10-30 | Disposition: A | Discharge status: Home / Self Care | Attending: Emergency Medicine | Admitting: Emergency Medicine

## 2020-10-29 DIAGNOSIS — J069 Acute upper respiratory infection, unspecified: Secondary | ICD-10-CM

## 2020-10-29 DIAGNOSIS — R6883 Chills (without fever): Secondary | ICD-10-CM | POA: Insufficient documentation

## 2020-10-29 DIAGNOSIS — Z87891 Personal history of nicotine dependence: Secondary | ICD-10-CM | POA: Diagnosis not present

## 2020-10-29 DIAGNOSIS — R519 Headache, unspecified: Secondary | ICD-10-CM | POA: Insufficient documentation

## 2020-10-29 DIAGNOSIS — M791 Myalgia, unspecified site: Secondary | ICD-10-CM | POA: Diagnosis not present

## 2020-10-29 DIAGNOSIS — R070 Pain in throat: Secondary | ICD-10-CM | POA: Diagnosis not present

## 2020-10-29 DIAGNOSIS — R0602 Shortness of breath: Secondary | ICD-10-CM | POA: Diagnosis present

## 2020-10-29 LAB — CBC WITH DIFFERENTIAL/PLATELET
Abs Immature Granulocytes: 0.03 10*3/uL (ref 0.00–0.07)
Basophils Absolute: 0 10*3/uL (ref 0.0–0.1)
Basophils Relative: 1 %
Eosinophils Absolute: 0.2 10*3/uL (ref 0.0–0.5)
Eosinophils Relative: 4 %
HCT: 40.2 % (ref 36.0–46.0)
Hemoglobin: 13.7 g/dL (ref 12.0–15.0)
Immature Granulocytes: 1 %
Lymphocytes Relative: 36 %
Lymphs Abs: 1.6 10*3/uL (ref 0.7–4.0)
MCH: 31 pg (ref 26.0–34.0)
MCHC: 34.1 g/dL (ref 30.0–36.0)
MCV: 91 fL (ref 80.0–100.0)
Monocytes Absolute: 0.5 10*3/uL (ref 0.1–1.0)
Monocytes Relative: 12 %
Neutro Abs: 2 10*3/uL (ref 1.7–7.7)
Neutrophils Relative %: 46 %
Platelets: 301 10*3/uL (ref 150–400)
RBC: 4.42 MIL/uL (ref 3.87–5.11)
RDW: 12.4 % (ref 11.5–15.5)
WBC: 4.3 10*3/uL (ref 4.0–10.5)
nRBC: 0 % (ref 0.0–0.2)

## 2020-10-29 LAB — COMPREHENSIVE METABOLIC PANEL
ALT: 12 U/L (ref 0–44)
AST: 16 U/L (ref 15–41)
Albumin: 4.3 g/dL (ref 3.5–5.0)
Alkaline Phosphatase: 42 U/L (ref 38–126)
Anion gap: 11 (ref 5–15)
BUN: 12 mg/dL (ref 6–20)
CO2: 27 mmol/L (ref 22–32)
Calcium: 9.4 mg/dL (ref 8.9–10.3)
Chloride: 98 mmol/L (ref 98–111)
Creatinine, Ser: 0.83 mg/dL (ref 0.44–1.00)
GFR, Estimated: >60 mL/min (ref >60)
Glucose, Bld: 93 mg/dL (ref 70–99)
Potassium: 4 mmol/L (ref 3.5–5.1)
Sodium: 136 mmol/L (ref 135–145)
Total Bilirubin: 0.7 mg/dL (ref 0.3–1.2)
Total Protein: 7.6 g/dL (ref 6.5–8.1)

## 2020-10-29 MED ORDER — SODIUM CHLORIDE (PF) 0.9 % IJ SOLN
INTRAMUSCULAR | Status: AC
  Filled 2020-10-29: qty 50

## 2020-10-29 MED ORDER — OXYCODONE-ACETAMINOPHEN 5-325 MG PO TABS
1.0000 | ORAL_TABLET | Freq: Once | ORAL | Status: AC
  Administered 2020-10-29: 1 via ORAL
  Filled 2020-10-29: qty 1

## 2020-10-29 MED ORDER — IOHEXOL 350 MG/ML SOLN
100.0000 mL | Freq: Once | INTRAVENOUS | Status: AC | PRN
  Administered 2020-10-30: 100 mL via INTRAVENOUS

## 2020-10-29 NOTE — ED Provider Notes (Addendum)
Received signout at beginning of shift, please see previous providers notes for complete H&P.  This is a 39 year old female recently diagnosed positive for COVID-19 and has had symptoms for the past week.  She is here for evaluation of worsening shortness of breath and chest discomfort.  She is currently awaits chest CT angiogram to rule out PE.  She also recently had hand surgery for tendon injury.  1:12 AM Labs are reassuring.  CT angiogram of the chest show no evidence of PE and no concerning finding.  Patient is not hypoxic.  No wheezing.  At this time she is stable for discharge.  Will prescribe albuterol inhaler to use as needed for shortness of breath.  Pt was fully vaccinated.  At this time she does not meet criteria for MAB.   BP 120/75 (BP Location: Right Arm)   Pulse 62   Temp 98.3 F (36.8 C) (Oral)   Resp 19   Ht 5\' 9"  (1.753 m)   Wt 70.3 kg   SpO2 98%   BMI 22.89 kg/m   Results for orders placed or performed during the hospital encounter of 10/29/20  Comprehensive metabolic panel  Result Value Ref Range   Sodium 136 135 - 145 mmol/L   Potassium 4.0 3.5 - 5.1 mmol/L   Chloride 98 98 - 111 mmol/L   CO2 27 22 - 32 mmol/L   Glucose, Bld 93 70 - 99 mg/dL   BUN 12 6 - 20 mg/dL   Creatinine, Ser 13/05/21 0.44 - 1.00 mg/dL   Calcium 9.4 8.9 - 5.95 mg/dL   Total Protein 7.6 6.5 - 8.1 g/dL   Albumin 4.3 3.5 - 5.0 g/dL   AST 16 15 - 41 U/L   ALT 12 0 - 44 U/L   Alkaline Phosphatase 42 38 - 126 U/L   Total Bilirubin 0.7 0.3 - 1.2 mg/dL   GFR, Estimated 63.8 >75 mL/min   Anion gap 11 5 - 15  CBC with Differential  Result Value Ref Range   WBC 4.3 4.0 - 10.5 K/uL   RBC 4.42 3.87 - 5.11 MIL/uL   Hemoglobin 13.7 12.0 - 15.0 g/dL   HCT >64 36 - 46 %   MCV 91.0 80.0 - 100.0 fL   MCH 31.0 26.0 - 34.0 pg   MCHC 34.1 30.0 - 36.0 g/dL   RDW 33.2 95.1 - 88.4 %   Platelets 301 150 - 400 K/uL   nRBC 0.0 0.0 - 0.2 %   Neutrophils Relative % 46 %   Neutro Abs 2.0 1.7 - 7.7 K/uL    Lymphocytes Relative 36 %   Lymphs Abs 1.6 0.7 - 4.0 K/uL   Monocytes Relative 12 %   Monocytes Absolute 0.5 0.1 - 1.0 K/uL   Eosinophils Relative 4 %   Eosinophils Absolute 0.2 0.0 - 0.5 K/uL   Basophils Relative 1 %   Basophils Absolute 0.0 0.0 - 0.1 K/uL   Immature Granulocytes 1 %   Abs Immature Granulocytes 0.03 0.00 - 0.07 K/uL   CT Angio Chest PE W/Cm &/Or Wo Cm  Result Date: 10/30/2020 CLINICAL DATA:  Positive D-dimer. COVID positive. Shortness of breath EXAM: CT ANGIOGRAPHY CHEST WITH CONTRAST TECHNIQUE: Multidetector CT imaging of the chest was performed using the standard protocol during bolus administration of intravenous contrast. Multiplanar CT image reconstructions and MIPs were obtained to evaluate the vascular anatomy. CONTRAST:  13/05/2020 OMNIPAQUE IOHEXOL 350 MG/ML SOLN COMPARISON:  07/30/2018 FINDINGS: Cardiovascular: Heart is normal size. Aorta is normal  caliber. No filling defects in the pulmonary arteries to suggest pulmonary emboli. Mediastinum/Nodes: No mediastinal, hilar, or axillary adenopathy. Trachea and esophagus are unremarkable. Thyroid unremarkable. Lungs/Pleura: Lungs are clear. No focal airspace opacities or suspicious nodules. No effusions. Upper Abdomen: Imaging into the upper abdomen demonstrates no acute findings. Musculoskeletal: Chest wall soft tissues are unremarkable. No acute bony abnormality. Review of the MIP images confirms the above findings. IMPRESSION: No evidence of pulmonary embolus. No acute cardiopulmonary disease. Electronically Signed   By: Charlett Nose M.D.   On: 10/30/2020 00:27   DG Chest Port 1 View  Result Date: 10/29/2020 CLINICAL DATA:  COVID positive, shortness of breath. EXAM: PORTABLE CHEST 1 VIEW COMPARISON:  Chest x-ray 10/02/2018, CT chest 07/30/2018 FINDINGS: The heart size and mediastinal contours are within normal limits. Biapical pleural/pulmonary scarring. No focal consolidation. No pulmonary edema. No pleural effusion. No  pneumothorax. No acute osseous abnormality. IMPRESSION: No active disease. Electronically Signed   By: Tish Frederickson M.D.   On: 10/29/2020 21:05   DG Hand Complete Left  Result Date: 10/09/2020 CLINICAL DATA:  Injury with knife EXAM: LEFT HAND - COMPLETE 3+ VIEW COMPARISON:  None. FINDINGS: Frontal, oblique, and lateral views were obtained. No fracture or dislocation. No joint space narrowing or erosion. A ring is present overlying the fourth proximal phalanx. Beyond this ring, there is no other radiopaque foreign body. There are foci of soft tissue air between the first and second metacarpals. IMPRESSION: Soft tissue air between the first and second metacarpals. No radiopaque foreign body in this area. No fracture or dislocation. No appreciable arthropathy. Electronically Signed   By: Bretta Bang III M.D.   On: 10/09/2020 16:43      Fayrene Helper, PA-C 10/30/20 0117    Fayrene Helper, PA-C 10/30/20 0149    Shon Baton, MD 10/31/20 [phone]

## 2020-10-29 NOTE — ED Triage Notes (Signed)
Patient reports Covid+ on 11/2. Reports O2 93% today with weakness and nausea.

## 2020-10-29 NOTE — ED Provider Notes (Signed)
[facility]-EMERGENCY DEPT Provider Note   CSN: [id number] Arrival date & time: 10/29/20  1909     History Chief Complaint  Patient presents with  . Covid Positive    Meghan Barry is a 39 y.o. female with recent history of left hand surgery on 10/21/2020 and recent diagnosis of COVID-19 on 10/26/2020, presenting with shortness of breath.  Patient states symptoms of Covid started on October 30.  She tested positive by rapid test and PCR test.  Today she began feeling significant tightness and heaviness in her chest, with shortness of breath with any exertion.  Her O2 saturation at home was 93% on room air and her heart rates were faster than usual around 90.  She states the body aches and chills, sore throat and headaches have improved, however the cough and shortness of breath persist.  She is having near syncopal episodes when she stands.  She has been treating symptoms with Tylenol and breathing exercises at home with minimal relief.  PCP recommended ED for evaluation.  The history is provided by the patient.       Past Medical History:  Diagnosis Date  . Anxiety   . Depression   . Stomach ulcer   . Venous insufficiency     Patient Active Problem List   Diagnosis Date Noted  . Cholecystitis 10/02/2018  . Stomach ulcer   . Gastritis 08/01/2018  . Abdominal pain 07/30/2018  . Anxiety 07/30/2018  . Depression 07/30/2018  . Peptic ulcer 07/30/2018    Past Surgical History:  Procedure Laterality Date  . ABDOMINAL HYSTERECTOMY    . anterior posterior vaginal wall repair    . BIOPSY  08/01/2018   Procedure: BIOPSY;  Surgeon: Lynann Bologna, MD;  Location: [facility] ENDOSCOPY;  Service: Endoscopy;;  . bladder tact    . BREAST CYST ASPIRATION Right   . CHOLECYSTECTOMY N/A 10/03/2018   Procedure: DIAGNOSTIC LAPAROSCOPY, LAPAROSCOPIC CHOLECYSTECTOMY;  Surgeon: Gaynelle Adu, MD;  Location: [facility] ORS;  Service: General;  Laterality: N/A;  . DILATION AND CURETTAGE  OF UTERUS    . ESOPHAGOGASTRODUODENOSCOPY N/A 08/01/2018   Procedure: ESOPHAGOGASTRODUODENOSCOPY (EGD);  Surgeon: Lynann Bologna, MD;  Location: [facility] ENDOSCOPY;  Service: Endoscopy;  Laterality: N/A;  . left ovary removal    . OVARIAN CYST SURGERY    . TONSILLECTOMY    . WRIST SURGERY       OB History    Gravida  5   Para  2   Term  2   Preterm      AB  3   Living  2     SAB  3   TAB      Ectopic      Multiple      Live Births  2           Family History  Problem Relation Age of Onset  . Cancer Sister   . Breast cancer Maternal Grandmother     Social History   Tobacco Use  . Smoking status: Former Smoker    Quit date: 12/25/2013    Years since quitting: 6.8  . Smokeless tobacco: Never Used  . Tobacco comment: quit smoking 2015  Vaping Use  . Vaping Use: Never used  Substance Use Topics  . Alcohol use: No  . Drug use: No    Home Medications Prior to Admission medications   Medication Sig Start Date End Date Taking? Authorizing Provider  acetaminophen (TYLENOL) 500 MG tablet Take 1,000 mg by mouth  every 6 (six) hours as needed for mild pain or moderate pain.   Yes [provider]  buPROPion (WELLBUTRIN SR) 150 MG 12 hr tablet Take 150 mg by mouth 2 (two) times daily.   Yes [provider]  Multiple Vitamin (MULTIVITAMIN) capsule Take 1 capsule by mouth daily.   Yes [provider]  omeprazole (PRILOSEC) 40 MG capsule Take 40 mg by mouth 2 (two) times daily. 09/24/18  Yes [provider]  VYVANSE 30 MG capsule Take 30 mg by mouth every morning. 10/05/20  Yes [provider]  acetaminophen (TYLENOL) 325 MG tablet Take 2 tablets (650 mg total) by mouth every 6 (six) hours as needed. Patient not taking: Reported on 10/29/2020 10/04/18   Adam Phenix, PA-C  AMITIZA 24 MCG capsule Take 24 mcg by mouth daily. 07/25/18   [provider]  Cholecalciferol (VITAMIN D-1000 MAX ST) 1000 units tablet Take 1,000  Units by mouth daily.    [provider]  HYDROcodone-acetaminophen (NORCO/VICODIN) 5-325 MG tablet Take 2 tablets by mouth every 4 (four) hours as needed. Patient not taking: Reported on 10/29/2020 10/09/20   Wieters, [employer], PA-C  NON FORMULARY Place 1 application vaginally at bedtime as needed (pain). Lidocaine 5% vaginal ointment    [provider]  polyethylene glycol (MIRALAX / GLYCOLAX) packet Take 17 g by mouth daily as needed for mild constipation.     [provider]  amphetamine-dextroamphetamine (ADDERALL) 10 MG tablet Take 10 mg by mouth daily as needed (concentration).  10/09/20  [provider]  clonazePAM (KLONOPIN) 1 MG tablet Take 1 mg by mouth at bedtime.   10/09/20  [provider]  sertraline (ZOLOFT) 100 MG tablet Take 200 mg by mouth at bedtime.   10/09/20  [provider]  zolpidem (AMBIEN CR) 6.25 MG CR tablet Take 6.25 mg by mouth at bedtime.  10/09/20  [provider]    Allergies    Bactrim [sulfamethoxazole-trimethoprim], Ciprofloxacin, Codeine, Dilaudid [hydromorphone hcl], Lunesta [eszopiclone], Morphine and related, Amoxicillin, and Penicillins  Review of Systems   Review of Systems  Respiratory: Positive for cough, chest tightness and shortness of breath.   Cardiovascular: Negative for leg swelling.  All other systems reviewed and are negative.   Physical Exam Updated Vital Signs BP 115/77 (BP Location: Right Arm)   Pulse 63   Temp 98.3 F (36.8 C) (Oral)   Resp 14   Ht 5\' 9"  (1.753 m)   Wt 70.3 kg   SpO2 100%   BMI 22.89 kg/m   Physical Exam Vitals and nursing note reviewed.  Constitutional:      Appearance: She is well-developed. She is ill-appearing.  HENT:     Head: Normocephalic and atraumatic.  Eyes:     Conjunctiva/sclera: Conjunctivae normal.  Cardiovascular:     Rate and Rhythm: Normal rate and regular rhythm.  Pulmonary:     Effort: Pulmonary effort is normal. No  respiratory distress.     Breath sounds: Normal breath sounds.  Abdominal:     General: Bowel sounds are normal.     Palpations: Abdomen is soft.     Tenderness: There is no abdominal tenderness.  Skin:    General: Skin is warm.  Neurological:     Mental Status: She is alert.  Psychiatric:        Behavior: Behavior normal.     ED Results / Procedures / Treatments   Labs (all labs ordered are listed, but only abnormal results are  displayed) Labs Reviewed  COMPREHENSIVE METABOLIC PANEL  CBC WITH DIFFERENTIAL/PLATELET    EKG None  Radiology DG Chest Port 1 View  Result Date: 10/29/2020 CLINICAL DATA:  COVID positive, shortness of breath. EXAM: PORTABLE CHEST 1 VIEW COMPARISON:  Chest x-ray 10/02/2018, CT chest 07/30/2018 FINDINGS: The heart size and mediastinal contours are within normal limits. Biapical pleural/pulmonary scarring. No focal consolidation. No pulmonary edema. No pleural effusion. No pneumothorax. No acute osseous abnormality. IMPRESSION: No active disease. Electronically Signed   By: Tish Frederickson M.D.   On: 10/29/2020 21:05    Procedures Procedures (including critical care time)  Medications Ordered in ED Medications  sodium chloride (PF) 0.9 % injection (has no administration in time range)  oxyCODONE-acetaminophen (PERCOCET/ROXICET) 5-325 MG per tablet 1 tablet (1 tablet Oral Given 10/29/20 2248)    ED Course  I have reviewed the triage vital signs and the nursing notes.  Pertinent labs & imaging results that were available during my care of the patient were reviewed by me and considered in my medical decision making (see chart for details).    MDM Rules/Calculators/A&P                          Patient with recent hand surgery on October 28, and diagnosed with Covid on November 2, presenting with worsening shortness of breath chest heaviness that began today.  She noted O2 saturation of 93% at home, and has was having difficulty breathing therefore  presented to the ED.  On exam, she is ill-appearing, however with stable vital signs, excellent O2 saturation at 100% on room air with normal work of breathing.  Lung sounds are clear.  She is ambulating well with excellent O2 saturation, however is feeling lightheaded.  Chest x-ray is negative for pneumonia.  Labs are unremarkable.  Had shared decision making with patient regarding recent surgery and COVID-19 illness, CTA of the chest ordered to evaluate for PE.  Care assumed at shift change by PA Laveda Norman to follow CT imaging.  If negative, patient likely appropriate for discharge. Final Clinical Impression(s) / ED Diagnoses Final diagnoses:  None    Rx / DC Orders ED Discharge Orders    None       Opha Mcghee, [place] N, PA-C 10/29/20 2358    Benjiman Core, MD 10/30/20 1451

## 2020-10-29 NOTE — ED Notes (Signed)
Patient ambulated around the room. Other than feeling light head, after taking several steps around the room, patient tolerated it well. SPO2 stayed between 97- 100% on room air.

## 2020-10-30 ENCOUNTER — Emergency Department (HOSPITAL_COMMUNITY)

## 2020-10-30 MED ORDER — ALBUTEROL SULFATE HFA 108 (90 BASE) MCG/ACT IN AERS
2.0000 | INHALATION_SPRAY | RESPIRATORY_TRACT | Status: DC | PRN
  Administered 2020-10-30: 2 via RESPIRATORY_TRACT
  Filled 2020-10-30: qty 6.7

## 2020-10-30 NOTE — Discharge Instructions (Addendum)
You have been evaluated for your shortness of breath after recent diagnosis of COVID-19.  A CT scan of your chest was obtained show no evidence of blood clot in your lung or any concerning finding.  Your shortness of breath is likely secondary to Covid infection.  Use albuterol inhaler 2 puffs every 4 hours as needed for shortness of breath.  Follow-up with your doctor for further care.  Return if you have any concern.

## 2021-01-08 ENCOUNTER — Encounter (HOSPITAL_COMMUNITY): Payer: Self-pay | Admitting: Emergency Medicine

## 2021-01-08 ENCOUNTER — Other Ambulatory Visit: Payer: Self-pay

## 2021-01-08 ENCOUNTER — Emergency Department (HOSPITAL_COMMUNITY)
Admission: EM | Admit: 2021-01-08 | Discharge: 2021-01-08 | Disposition: A | Discharge status: Home / Self Care | Attending: Emergency Medicine | Admitting: Emergency Medicine

## 2021-01-08 ENCOUNTER — Emergency Department (HOSPITAL_COMMUNITY)

## 2021-01-08 DIAGNOSIS — K5901 Slow transit constipation: Secondary | ICD-10-CM | POA: Diagnosis not present

## 2021-01-08 DIAGNOSIS — R519 Headache, unspecified: Secondary | ICD-10-CM | POA: Diagnosis not present

## 2021-01-08 DIAGNOSIS — R0602 Shortness of breath: Secondary | ICD-10-CM | POA: Insufficient documentation

## 2021-01-08 DIAGNOSIS — R55 Syncope and collapse: Secondary | ICD-10-CM | POA: Insufficient documentation

## 2021-01-08 DIAGNOSIS — R079 Chest pain, unspecified: Secondary | ICD-10-CM | POA: Diagnosis not present

## 2021-01-08 DIAGNOSIS — Z87891 Personal history of nicotine dependence: Secondary | ICD-10-CM | POA: Insufficient documentation

## 2021-01-08 LAB — COMPREHENSIVE METABOLIC PANEL
ALT: 11 U/L (ref 0–44)
AST: 15 U/L (ref 15–41)
Albumin: 4 g/dL (ref 3.5–5.0)
Alkaline Phosphatase: 39 U/L (ref 38–126)
Anion gap: 9 (ref 5–15)
BUN: 13 mg/dL (ref 6–20)
CO2: 26 mmol/L (ref 22–32)
Calcium: 9.2 mg/dL (ref 8.9–10.3)
Chloride: 101 mmol/L (ref 98–111)
Creatinine, Ser: 1 mg/dL (ref 0.44–1.00)
GFR, Estimated: >60 mL/min (ref >60)
Glucose, Bld: 113 mg/dL — ABNORMAL HIGH (ref 70–99)
Potassium: 3.9 mmol/L (ref 3.5–5.1)
Sodium: 136 mmol/L (ref 135–145)
Total Bilirubin: 0.8 mg/dL (ref 0.3–1.2)
Total Protein: 6.6 g/dL (ref 6.5–8.1)

## 2021-01-08 LAB — LIPASE, BLOOD: Lipase: 24 U/L (ref 11–51)

## 2021-01-08 LAB — CBC
HCT: 39.1 % (ref 36.0–46.0)
Hemoglobin: 13.4 g/dL (ref 12.0–15.0)
MCH: 32.1 pg (ref 26.0–34.0)
MCHC: 34.3 g/dL (ref 30.0–36.0)
MCV: 93.5 fL (ref 80.0–100.0)
Platelets: 285 10*3/uL (ref 150–400)
RBC: 4.18 MIL/uL (ref 3.87–5.11)
RDW: 13.1 % (ref 11.5–15.5)
WBC: 4.8 10*3/uL (ref 4.0–10.5)
nRBC: 0 % (ref 0.0–0.2)

## 2021-01-08 LAB — I-STAT BETA HCG BLOOD, ED (MC, WL, AP ONLY): I-stat hCG, quantitative: <5 m[IU]/mL (ref <5)

## 2021-01-08 LAB — TROPONIN I (HIGH SENSITIVITY)
Troponin I (High Sensitivity): 3 ng/L (ref <18)
Troponin I (High Sensitivity): 2 ng/L (ref <18)

## 2021-01-08 MED ORDER — ALUM & MAG HYDROXIDE-SIMETH 200-200-20 MG/5ML PO SUSP
30.0000 mL | Freq: Once | ORAL | Status: AC
  Administered 2021-01-08: 30 mL via ORAL
  Filled 2021-01-08: qty 30

## 2021-01-08 MED ORDER — FENTANYL CITRATE (PF) 100 MCG/2ML IJ SOLN
25.0000 ug | Freq: Once | INTRAMUSCULAR | Status: AC
  Administered 2021-01-08: 25 ug via INTRAVENOUS
  Filled 2021-01-08: qty 2

## 2021-01-08 MED ORDER — SODIUM CHLORIDE 0.9 % IV BOLUS
1000.0000 mL | Freq: Once | INTRAVENOUS | Status: AC
  Administered 2021-01-08: 1000 mL via INTRAVENOUS

## 2021-01-08 MED ORDER — KETOROLAC TROMETHAMINE 30 MG/ML IJ SOLN
30.0000 mg | Freq: Once | INTRAMUSCULAR | Status: AC
  Administered 2021-01-08: 30 mg via INTRAVENOUS
  Filled 2021-01-08: qty 1

## 2021-01-08 MED ORDER — IOHEXOL 300 MG/ML  SOLN
80.0000 mL | Freq: Once | INTRAMUSCULAR | Status: AC | PRN
  Administered 2021-01-08: 80 mL via INTRAVENOUS

## 2021-01-08 MED ORDER — DICYCLOMINE HCL 10 MG/ML IM SOLN
20.0000 mg | Freq: Once | INTRAMUSCULAR | Status: AC
  Administered 2021-01-08: 20 mg via INTRAMUSCULAR
  Filled 2021-01-08: qty 2

## 2021-01-08 NOTE — ED Notes (Signed)
Patient transported to CT 

## 2021-01-08 NOTE — ED Provider Notes (Signed)
[facility] EMERGENCY DEPARTMENT Provider Note   CSN: [id number] Arrival date & time: 01/08/21  1832     History Chief Complaint  Patient presents with  . Abdominal Pain    Meghan Barry is a 40 y.o. female with a past medical history of PUD, anxiety, depression, status post hysterectomy and cholecystectomy presenting to the ED with a chief complaint of syncope.  States that she was standing in church when she suddenly felt dizzy and had a syncopal episode.  When she woke up she started having right-sided chest pain, abdominal pain and felt like her abdomen was swollen.  Reports some shortness of breath associated with the pain as well.  She has had a slight headache since regaining consciousness as well.  States that she ate some food today which was not different from what she usually eats.  Denies any vomiting, diarrhea or changes to urination.  No anticoagulant use.  No leg swelling or recent immobilization.  She had a surgery on her left wrist at the end of October to repair a tendon.  Denies any new changes to medications.  HPI     Past Medical History:  Diagnosis Date  . Anxiety   . Depression   . Stomach ulcer   . Venous insufficiency     Patient Active Problem List   Diagnosis Date Noted  . Cholecystitis 10/02/2018  . Stomach ulcer   . Gastritis 08/01/2018  . Abdominal pain 07/30/2018  . Anxiety 07/30/2018  . Depression 07/30/2018  . Peptic ulcer 07/30/2018    Past Surgical History:  Procedure Laterality Date  . ABDOMINAL HYSTERECTOMY    . anterior posterior vaginal wall repair    . BIOPSY  08/01/2018   Procedure: BIOPSY;  Surgeon: Lynann Bologna, MD;  Location: [facility] ENDOSCOPY;  Service: Endoscopy;;  . bladder tact    . BREAST CYST ASPIRATION Right   . CHOLECYSTECTOMY N/A 10/03/2018   Procedure: DIAGNOSTIC LAPAROSCOPY, LAPAROSCOPIC CHOLECYSTECTOMY;  Surgeon: Gaynelle Adu, MD;  Location: [facility] ORS;  Service: General;  Laterality: N/A;  .  DILATION AND CURETTAGE OF UTERUS    . ESOPHAGOGASTRODUODENOSCOPY N/A 08/01/2018   Procedure: ESOPHAGOGASTRODUODENOSCOPY (EGD);  Surgeon: Lynann Bologna, MD;  Location: [facility] ENDOSCOPY;  Service: Endoscopy;  Laterality: N/A;  . left ovary removal    . OVARIAN CYST SURGERY    . TONSILLECTOMY    . WRIST SURGERY       OB History    Gravida  5   Para  2   Term  2   Preterm      AB  3   Living  2     SAB  3   IAB      Ectopic      Multiple      Live Births  2           Family History  Problem Relation Age of Onset  . Cancer Sister   . Breast cancer Maternal Grandmother     Social History   Tobacco Use  . Smoking status: Former Smoker    Quit date: 12/25/2013    Years since quitting: 7.0  . Smokeless tobacco: Never Used  . Tobacco comment: quit smoking 2015  Vaping Use  . Vaping Use: Never used  Substance Use Topics  . Alcohol use: No  . Drug use: No    Home Medications Prior to Admission medications   Medication Sig Start Date End Date Taking? Authorizing Provider  acetaminophen (TYLENOL) 325 MG  tablet Take 2 tablets (650 mg total) by mouth every 6 (six) hours as needed. Patient not taking: Reported on 10/29/2020 10/04/18   Adam PhenixSimaan, Elizabeth S, PA-C  acetaminophen (TYLENOL) 500 MG tablet Take 1,000 mg by mouth every 6 (six) hours as needed for mild pain or moderate pain.    [provider]  AMITIZA 24 MCG capsule Take 24 mcg by mouth daily. 07/25/18   [provider]  buPROPion (WELLBUTRIN SR) 150 MG 12 hr tablet Take 150 mg by mouth 2 (two) times daily.    [provider]  Cholecalciferol (VITAMIN D-1000 MAX ST) 1000 units tablet Take 1,000 Units by mouth daily.    [provider]  HYDROcodone-acetaminophen (NORCO/VICODIN) 5-325 MG tablet Take 2 tablets by mouth every 4 (four) hours as needed. Patient not taking: Reported on 10/29/2020 10/09/20   Wieters, Fran LowesHallie C, PA-C  Multiple Vitamin (MULTIVITAMIN) capsule Take 1 capsule by  mouth daily.    [provider]  NON FORMULARY Place 1 application vaginally at bedtime as needed (pain). Lidocaine 5% vaginal ointment    [provider]  omeprazole (PRILOSEC) 40 MG capsule Take 40 mg by mouth 2 (two) times daily. 09/24/18   [provider]  polyethylene glycol (MIRALAX / GLYCOLAX) packet Take 17 g by mouth daily as needed for mild constipation.     [provider]  VYVANSE 30 MG capsule Take 30 mg by mouth every morning. 10/05/20   [provider]  amphetamine-dextroamphetamine (ADDERALL) 10 MG tablet Take 10 mg by mouth daily as needed (concentration).  10/09/20  [provider]  clonazePAM (KLONOPIN) 1 MG tablet Take 1 mg by mouth at bedtime.   10/09/20  [provider]  sertraline (ZOLOFT) 100 MG tablet Take 200 mg by mouth at bedtime.   10/09/20  [provider]  zolpidem (AMBIEN CR) 6.25 MG CR tablet Take 6.25 mg by mouth at bedtime.  10/09/20  [provider]    Allergies    Bactrim [sulfamethoxazole-trimethoprim], Ciprofloxacin, Codeine, Dilaudid [hydromorphone hcl], Lunesta [eszopiclone], Morphine and related, Amoxicillin, and Penicillins  Review of Systems   Review of Systems  Constitutional: Negative for appetite change, chills and fever.  HENT: Negative for ear pain, rhinorrhea, sneezing and sore throat.   Eyes: Negative for photophobia and visual disturbance.  Respiratory: Positive for shortness of breath. Negative for cough, chest tightness and wheezing.   Cardiovascular: Positive for chest pain. Negative for palpitations.  Gastrointestinal: Positive for abdominal pain. Negative for blood in stool, constipation, diarrhea, nausea and vomiting.  Genitourinary: Negative for dysuria, hematuria and urgency.  Musculoskeletal: Negative for myalgias.  Skin: Negative for rash.  Neurological: Positive for syncope and headaches. Negative for dizziness, weakness and light-headedness.     Physical Exam Updated Vital Signs BP 108/73   Pulse (!) 56   Temp 97.9 F (36.6 C) (Oral)   Resp 18   SpO2 100%   Physical Exam Vitals and nursing note reviewed.  Constitutional:      General: She is not in acute distress.    Appearance: She is well-developed and well-nourished.  HENT:     Head: Normocephalic and atraumatic.     Nose: Nose normal.  Eyes:     General: No scleral icterus.       Left eye: No discharge.     Extraocular Movements: EOM normal.     Conjunctiva/sclera: Conjunctivae normal.  Cardiovascular:     Rate and Rhythm: Normal rate and regular rhythm.  Pulses: Intact distal pulses.     Heart sounds: Normal heart sounds. No murmur heard. No friction rub. No gallop.   Pulmonary:     Effort: Pulmonary effort is normal. No respiratory distress.     Breath sounds: Normal breath sounds.  Abdominal:     General: Bowel sounds are normal. There is no distension.     Palpations: Abdomen is soft.     Tenderness: There is abdominal tenderness in the epigastric area and periumbilical area. There is no guarding.  Musculoskeletal:        General: No edema. Normal range of motion.     Cervical back: Normal range of motion and neck supple.     Right lower leg: No edema.     Left lower leg: No edema.  Skin:    General: Skin is warm and dry.     Findings: No rash.  Neurological:     Mental Status: She is alert.     Motor: No abnormal muscle tone.     Coordination: Coordination normal.  Psychiatric:        Mood and Affect: Mood and affect normal.     ED Results / Procedures / Treatments   Labs (all labs ordered are listed, but only abnormal results are displayed) Labs Reviewed  COMPREHENSIVE METABOLIC PANEL - Abnormal; Notable for the following components:      Result Value   Glucose, Bld 113 (*)    All other components within normal limits  LIPASE, BLOOD  CBC  I-STAT BETA HCG BLOOD, ED ([facility], [facility], [facility] ONLY)  TROPONIN I (HIGH SENSITIVITY)  TROPONIN I  (HIGH SENSITIVITY)    EKG EKG Interpretation  Date/Time:  Saturday January 08 2021 18:56:32 EST Ventricular Rate:  61 PR Interval:  142 QRS Duration: 74 QT Interval:  426 QTC Calculation: 428 R Axis:   80 Text Interpretation: Normal sinus rhythm Normal ECG since last tracing no significant change Confirmed by Eber Hong ([id number]) on 01/08/2021 7:50:43 PM   Radiology DG Chest 2 View  Result Date: 01/08/2021 CLINICAL DATA:  Chest pain and shortness of breath EXAM: CHEST - 2 VIEW COMPARISON:  October 29, 2020 FINDINGS: The heart size and mediastinal contours are within normal limits. Both lungs are clear. The visualized skeletal structures are unremarkable. IMPRESSION: No active cardiopulmonary disease. Electronically Signed   By: Jonna Clark M.D.   On: 01/08/2021 19:36   CT ABDOMEN PELVIS W CONTRAST  Result Date: 01/08/2021 CLINICAL DATA:  Abdominal distension EXAM: CT ABDOMEN AND PELVIS WITH CONTRAST TECHNIQUE: Multidetector CT imaging of the abdomen and pelvis was performed using the standard protocol following bolus administration of intravenous contrast. CONTRAST:  35mL OMNIPAQUE IOHEXOL 300 MG/ML  SOLN COMPARISON:  None. FINDINGS: Lower chest: The visualized heart size within normal limits. No pericardial fluid/thickening. No hiatal hernia. The visualized portions of the lungs are clear. Hepatobiliary: The liver is normal in density without focal abnormality.The main portal vein is patent. The patient is status post cholecystectomy. No biliary ductal dilation. Pancreas: Unremarkable. No pancreatic ductal dilatation or surrounding inflammatory changes. Spleen: Normal in size without focal abnormality. Adrenals/Urinary Tract: Both adrenal glands appear normal. The kidneys and collecting system appear normal without evidence of urinary tract calculus or hydronephrosis. Bladder is unremarkable. Stomach/Bowel: The stomach, small bowel, and colon are normal in appearance. A moderate to large  amount of colonic stool is seen throughout. No inflammatory changes, wall thickening, or obstructive findings.The appendix is normal. Vascular/Lymphatic: There are no enlarged mesenteric, retroperitoneal, or  pelvic lymph nodes. No significant vascular findings are present. Reproductive: No fluid or other abnormality seen within the deep pelvis. Other: No evidence of abdominal wall mass or hernia. Musculoskeletal: No acute or significant osseous findings. IMPRESSION: Moderate to large amount of colonic stool without evidence of obstruction. No other acute intra-abdominal or pelvic pathology to explain the patient's symptoms. Electronically Signed   By: Jonna ClarkBindu  Avutu M.D.   On: 01/08/2021 21:35    Procedures Procedures (including critical care time)  Medications Ordered in ED Medications  alum & mag hydroxide-simeth (MAALOX/MYLANTA) 200-200-20 MG/5ML suspension 30 mL (30 mLs Oral Given 01/08/21 2017)  sodium chloride 0.9 % bolus 1,000 mL (1,000 mLs Intravenous New Bag/Given 01/08/21 2019)  sodium chloride 0.9 % bolus 1,000 mL (1,000 mLs Intravenous New Bag/Given 01/08/21 2139)  fentaNYL (SUBLIMAZE) injection 25 mcg (25 mcg Intravenous Given 01/08/21 2137)  iohexol (OMNIPAQUE) 300 MG/ML solution 80 mL (80 mLs Intravenous Contrast Given 01/08/21 2115)  dicyclomine (BENTYL) injection 20 mg (20 mg Intramuscular Given 01/08/21 2219)  ketorolac (TORADOL) 30 MG/ML injection 30 mg (30 mg Intravenous Given 01/08/21 2217)    ED Course  I have reviewed the triage vital signs and the nursing notes.  Pertinent labs & imaging results that were available during my care of the patient were reviewed by me and considered in my medical decision making (see chart for details).  Clinical Course as of 01/08/21 2258  Sat Jan 08, 2021  2031 Troponin I (High Sensitivity): 3 [HK]  2031 Creatinine: 1.00 [HK]  2039 WBC: 4.8 [HK]  2039 Hemoglobin: 13.4 [HK]  2228 Troponin I (High Sensitivity): 2 [HK]  2242 CT ABDOMEN PELVIS W  CONTRAST Large stool burden, no other abnormalities. [HK]  2253 Patient with significant improvement in her symptoms with Toradol and Bentyl.  She is now sitting up and resting comfortably.  Her blood pressure is improved to 110 systolic [HK]    Clinical Course User Index [HK] Dietrich PatesKhatri, Braylen Staller, PA-C   MDM Rules/Calculators/A&P                          40 year old female with past medical history of PUD, anxiety, depression, status post hysterectomy and cholecystectomy presenting to the ED with a chief complaint of syncope.  She was standing in church today when she suddenly felt dizzy and had a syncopal episode.  Woke up and started having right-sided chest pain, abdominal pain folic her abdomen was swollen.  Reports some shortness of breath.  Reports headache as well.  States that she ate some food today which was not different from what she usually eats.  No vomiting, diarrhea or changes to urination.  No recent immobilization or leg swelling.  On exam abdomen is generally tender mostly in the middle area.  Chest is tender on the right side.  No lower extremity edema, erythema or calf tenderness bilaterally.  EKG shows normal sinus rhythm, no ischemic changes and no STEMI.  Chest x-ray without any acute abnormalities.  CMP, CBC and lipase unremarkable.  Initial troponin is normal at 3.  CT of the abdomen pelvis shows findings consistent with large amount of stool without evidence of obstruction.  Patient states that she had a bowel movement today and feels that she is regular.  Will give IV fluids, treat symptomatically and reassess.   Delta troponin is normal.  Patient's blood pressures improved here to 108 systolic after 2 L of fluid.  I feel that her syncope could be  in the setting of dehydration.  She is low risk for PE and her vital signs within normal limits without any evidence of tachycardia or hypoxia she was given additional symptomatic treatment with Toradol and Bentyl.  On recheck she reports  significant improvement and states that "the activity in my belly has settled down a little bit."  She does have a GI doctor that she seen in the past for her PUD. Will have her follow up with GI and PCP. She denies any urinary symptoms and is not concerned for a UTI.   There is no evidence of a perforated ulcer at this time.  She is comfortable with discharge home, increasing her fluid intake and taking laxatives as needed.  Patient is agreeable to the plan.  Return precautions given.  Patient is hemodynamically stable, in NAD, and able to ambulate in the ED. Evaluation does not show pathology that would require ongoing emergent intervention or inpatient treatment. I explained the diagnosis to the patient. Pain has been managed and has no complaints prior to discharge. Patient is comfortable with above plan and is stable for discharge at this time. All questions were answered prior to disposition. Strict return precautions for returning to the ED were discussed. Encouraged follow up with PCP.   An After Visit Summary was printed and given to the patient.   Portions of this note were generated with Scientist, clinical (histocompatibility and immunogenetics). Dictation errors may occur despite best attempts at proofreading.  Final Clinical Impression(s) / ED Diagnoses Final diagnoses:  Syncope, unspecified syncope type  Slow transit constipation    Rx / DC Orders ED Discharge Orders    None       Dietrich Pates, PA-C 01/08/21 2258    Eber Hong, MD 01/09/21 [phone]

## 2021-01-08 NOTE — Discharge Instructions (Signed)
Make sure you are drinking plenty of fluids.  You can take medication such as MiraLAX to help with your constipation. Follow-up with your GI doctor and your primary care provider. Return to the ER if you start to experience worsening abdominal pain, bloody stools or have another episode of syncope.

## 2021-01-08 NOTE — ED Triage Notes (Signed)
Pt to triage via [facility] from church.  Started having generalized abd pain during middle of service.  She had a syncopal episode.  Bp initially 120/-.  CBG 82.  Now reports abd swelling, difficulty breathing, and Chest pain.

## 2021-03-04 ENCOUNTER — Other Ambulatory Visit: Payer: Self-pay

## 2021-03-04 ENCOUNTER — Ambulatory Visit: Admitting: Cardiology

## 2021-03-04 ENCOUNTER — Encounter: Payer: Self-pay | Admitting: Cardiology

## 2021-03-04 VITALS — BP 111/74 | HR 69 | Temp 98.6°F | Resp 17 | Ht 69.0 in | Wt 167.6 lb

## 2021-03-04 DIAGNOSIS — R002 Palpitations: Secondary | ICD-10-CM

## 2021-03-04 DIAGNOSIS — R55 Syncope and collapse: Secondary | ICD-10-CM

## 2021-03-04 NOTE — Progress Notes (Signed)
Primary Physician/Referring:  Henrine Screws, MD  Patient ID: Meghan Barry, female    DOB: [birth year], 40 y.o.   MRN: [id number]  Chief Complaint  Patient presents with  . Loss of Consciousness  . 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  With history of hysterectomy, venous insufficiency status post ablation in [place], remote history of GI ulcer.  Denies history of hypertension, hyperlipidemia, diabetes, CAD, MI, TIA/CVA, DVT/PE.  She does report family history of coronary artery disease, her father died at the age of 2 due to MI.  Patient smoked approximately half pack per day for several years, quit in 2015.   Patient is referred to our office by her PCP for evaluation of recurrent syncope and near syncope.  Patient states that since January she has had frequent episodes of syncope preceded by symptoms including diaphoresis, palpitations, dizziness, nausea.  These episodes are followed by migraines and continued nausea.  Patient states positional changes, particularly standing up seem to trigger these episodes.  She is having near syncopal events daily, and since January has had episodes of syncope approximately once per week.  Patient is relatively active, running 5 miles per day without issue.  She eats relatively healthy diet.  She denies chest pain, leg swelling, orthopnea, PND, dyspnea.   Past Medical History:  Diagnosis Date  . Anxiety   . Depression   . Stomach ulcer   . Venous insufficiency    Past Surgical History:  Procedure Laterality Date  . ABDOMINAL HYSTERECTOMY    . anterior posterior vaginal wall repair    . BIOPSY  08/01/2018   Procedure: BIOPSY;  Surgeon: Lynann Bologna, MD;  Location: [facility] ENDOSCOPY;  Service: Endoscopy;;  . bladder tact    . BREAST CYST ASPIRATION Right   . CHOLECYSTECTOMY N/A 10/03/2018   Procedure: DIAGNOSTIC LAPAROSCOPY, LAPAROSCOPIC CHOLECYSTECTOMY;   Surgeon: Gaynelle Adu, MD;  Location: [facility] ORS;  Service: General;  Laterality: N/A;  . DILATION AND CURETTAGE OF UTERUS    . ESOPHAGOGASTRODUODENOSCOPY N/A 08/01/2018   Procedure: ESOPHAGOGASTRODUODENOSCOPY (EGD);  Surgeon: Lynann Bologna, MD;  Location: [facility] ENDOSCOPY;  Service: Endoscopy;  Laterality: N/A;  . left ovary removal    . OVARIAN CYST SURGERY    . TONSILLECTOMY    . WRIST SURGERY     Family History  Problem Relation Age of Onset  . Cancer Sister   . Breast cancer Maternal Grandmother   . Heart attack Mother     Social History   Tobacco Use  . Smoking status: Former Smoker    Packs/day: 0.50    Types: Cigarettes    Quit date: 12/25/2013    Years since quitting: 7.1  . Smokeless tobacco: Never Used  . Tobacco comment: quit smoking 2015  Substance Use Topics  . Alcohol use: No   Marital Status: Married  ROS  Review of Systems  Constitutional: Positive for malaise/fatigue. Negative for weight gain.  Cardiovascular: Positive for near-syncope, palpitations and syncope. Negative for chest pain, claudication, leg swelling, orthopnea and paroxysmal nocturnal dyspnea.  Respiratory: Negative for shortness of breath.   Hematologic/Lymphatic: Does not bruise/bleed easily.  Gastrointestinal: Negative for melena.  Neurological: Negative for dizziness and weakness.   Objective  Blood pressure 111/74, pulse 69, temperature 98.6 F (37 C), temperature source Temporal, resp. rate 17, height 5\' 9"  (1.753 m), weight 167 lb 9.6 oz (76 kg), SpO2 100 %.  Vitals  with BMI 03/04/2021 01/08/2021 01/08/2021  Height 5\' 9"  - -  Weight 167 lbs 10 oz - -  BMI 24.74 - -  Systolic 111 101  Diastolic 74 66 73  Pulse 69 55 56    Orthostatic VS for the past 72 hrs (Last 3 readings):  Orthostatic BP Patient Position BP Location Cuff Size Orthostatic Pulse  03/04/21 1123 108/76 Standing Left Arm Normal 84  03/04/21 1122 111/82 Sitting Left Arm Normal 80  03/04/21 1121 107/81 Supine Left Arm Normal  70     Physical Exam Vitals reviewed.  HENT:     Head: Normocephalic and atraumatic.  Neck:     Vascular: No carotid bruit.  Cardiovascular:     Rate and Rhythm: Normal rate and regular rhythm.     Pulses: Intact distal pulses.          Carotid pulses are 2+ on the right side and 2+ on the left side.      Radial pulses are 2+ on the right side and 2+ on the left side.       Femoral pulses are 2+ on the right side and 2+ on the left side.      Popliteal pulses are 2+ on the right side and 2+ on the left side.       Dorsalis pedis pulses are 2+ on the right side and 2+ on the left side.       Posterior tibial pulses are 1+ on the right side and 1+ on the left side.     Heart sounds: S1 normal and S2 normal. No murmur heard. No gallop.   Pulmonary:     Effort: Pulmonary effort is normal. No respiratory distress.     Breath sounds: No wheezing, rhonchi or rales.  Abdominal:     General: Abdomen is flat. Bowel sounds are normal. There is no distension.     Palpations: Abdomen is soft.  Musculoskeletal:     Right lower leg: No edema.     Left lower leg: No edema.  Skin:    General: Skin is warm and dry.     Capillary Refill: Capillary refill takes less than 2 seconds.  Neurological:     General: No focal deficit present.     Mental Status: She is alert and oriented to person, place, and time.    Laboratory examination:   Recent Labs    10/29/20 2047 01/08/21 1921  NA 136 136  K 4.0 3.9  CL 98 101  CO2 27 26  GLUCOSE 93 113*  BUN 12 13  CREATININE 0.83 1.00  CALCIUM 9.4 9.2  GFRNONAA >60 >60   CrCl cannot be calculated (Patient's most recent lab result is older than the maximum 21 days allowed.).  CMP Latest Ref Rng & Units 01/08/2021 10/29/2020 10/03/2018  Glucose 70 - 99 mg/dL 12/03/2018) 93 256(L)  BUN 6 - 20 mg/dL 13 12 7   Creatinine 0.44 - 1.00 mg/dL 893(T 3.42  Sodium 135 - 145 mmol/L 136 136 139  Potassium 3.5 - 5.1 mmol/L 3.9 4.0 3.7  Chloride 98 - 111 mmol/L  101 98 108  CO2 22 - 32 mmol/L 26 27 23   Calcium 8.9 - 10.3 mg/dL 9.2 9.4 8.76)  Total Protein 6.5 - 8.1 g/dL 6.6 7.6 6.3(L)  Total Bilirubin 0.3 - 1.2 mg/dL 0.8 0.7 1.0  Alkaline Phos 38 - 126 U/L 39 42 40  AST 15 - 41 U/L 15 16 24   ALT 0 -  44 U/L 11 12 14    CBC Latest Ref Rng & Units 01/08/2021 10/29/2020 10/03/2018  WBC 4.0 - 10.5 K/uL 4.8 4.3 4.4  Hemoglobin 12.0 - 15.0 g/dL 12/03/2018 02.5 10.2(L)  Hematocrit 36.0 - 46.0 % 39.1 40.2 34.2(L)  Platelets 150 - 400 K/uL 285 301 182    Lipid Panel No results for input(s): CHOL, TRIG, LDLCALC, VLDL, HDL, CHOLHDL, LDLDIRECT in the last 8760 hours.  HEMOGLOBIN A1C No results found for: HGBA1C, MPG TSH No results for input(s): TSH in the last 8760 hours.  External labs:   None   Medications and allergies   Allergies  Allergen Reactions  . Bactrim [Sulfamethoxazole-Trimethoprim] Other (See Comments)    Mouth sores, stiff neck and H/A  . Ciprofloxacin Hives    flushed face  . Codeine Other (See Comments)    Flushed red face, migraines  . Dilaudid [Hydromorphone Hcl] Other (See Comments)    migraine  . Lunesta [Eszopiclone] Other (See Comments)    hallucinations  . Morphine And Related Other (See Comments)    Severe migraine   . Duloxetine Hcl     Other reaction(s): psychotic thoughts  . Amoxicillin Rash    Has patient had a PCN reaction causing immediate rash, facial/tongue/throat swelling, SOB or lightheadedness with hypotension: Yes Has patient had a PCN reaction causing severe rash involving mucus membranes or skin necrosis: No Has patient had a PCN reaction that required hospitalization: No Has patient had a PCN reaction occurring within the last 10 years: No If all of the above answers are "NO", then may proceed with Cephalosporin use.  42.7 Penicillins Rash    Has patient had a PCN reaction causing immediate rash, facial/tongue/throat swelling, SOB or lightheadedness with hypotension: Yes Has patient had a PCN reaction  causing severe rash involving mucus membranes or skin necrosis: No Has patient had a PCN reaction that required hospitalization: No Has patient had a PCN reaction occurring within the last 10 years: No If all of the above answers are "NO", then may proceed with Cephalosporin use.     Outpatient Medications Prior to Visit  Medication Sig Dispense Refill  . buPROPion (WELLBUTRIN SR) 150 MG 12 hr tablet Take 150 mg by mouth 2 (two) times daily.    . Multiple Vitamin (MULTIVITAMIN) capsule Take 1 capsule by mouth daily.    Marland Kitchen VYVANSE 30 MG capsule Take 30 mg by mouth every morning.    Marland Kitchen acetaminophen (TYLENOL) 325 MG tablet Take 2 tablets (650 mg total) by mouth every 6 (six) hours as needed. (Patient not taking: Reported on 10/29/2020)    . acetaminophen (TYLENOL) 500 MG tablet Take 1,000 mg by mouth every 6 (six) hours as needed for mild pain or moderate pain.    13/04/2020 AMITIZA 24 MCG capsule Take 24 mcg by mouth daily.  3  . Cholecalciferol (VITAMIN D-1000 MAX ST) 1000 units tablet Take 1,000 Units by mouth daily.    Marland Kitchen HYDROcodone-acetaminophen (NORCO/VICODIN) 5-325 MG tablet Take 2 tablets by mouth every 4 (four) hours as needed. (Patient not taking: Reported on 10/29/2020) 10 tablet 0  . NON FORMULARY Place 1 application vaginally at bedtime as needed (pain). Lidocaine 5% vaginal ointment    . omeprazole (PRILOSEC) 40 MG capsule Take 40 mg by mouth 2 (two) times daily.    . polyethylene glycol (MIRALAX / GLYCOLAX) packet Take 17 g by mouth daily as needed for mild constipation.      No facility-administered medications prior to visit.  Radiology:   No results found.  Cardiac Studies:   None  EKG:   EKG 03/04/2021: Sinus rhythm at a rate of 70 bpm.  Normal axis.  Incomplete right bundle branch block.  Nonspecific T wave abnormality.  No evidence of ischemia or underlying injury pattern.  Assessment     ICD-10-CM   1. Syncope and collapse  R55 EKG 12-Lead    LONG TERM MONITOR (3-14  DAYS)  2. Palpitations  R00.2   3. Neurocardiogenic syncope  R55   4. Neurocardiogenic pre-syncope  R55      Medications Discontinued During This Encounter  Medication Reason  . AMITIZA 24 MCG capsule Error  . Cholecalciferol (VITAMIN D-1000 MAX ST) 1000 units tablet Error  . NON FORMULARY Error  . polyethylene glycol (MIRALAX / GLYCOLAX) packet Error  . HYDROcodone-acetaminophen (NORCO/VICODIN) 5-325 MG tablet Error  . acetaminophen (TYLENOL) 500 MG tablet Error  . acetaminophen (TYLENOL) 325 MG tablet Error  . omeprazole (PRILOSEC) 40 MG capsule Error    No orders of the defined types were placed in this encounter.  Orders Placed This Encounter  Procedures  . LONG TERM MONITOR (3-14 DAYS)    Standing Status:   Future    Standing Expiration Date:   03/04/2022    Order Specific Question:   Where should this test be performed?    Answer:   [facility]    Order Specific Question:   Does the patient have an implanted cardiac device?    Answer:   No    Order Specific Question:   Prescribed days of wear    Answer:   14  . 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  With history of hysterectomy, [place], remote history of GI ulcer.  Denies history of hypertension, hyperlipidemia, diabetes, CAD, MI, TIA/CVA, DVT/PE.  She does report family history of coronary artery disease, her father died at the age of 39 due to MI.  Patient smoked approximately half pack per day for several years, quit in 2015.   Patient is referred to our office for evaluation of recurrent syncopal episodes and near syncope.  Will obtain 2-week ambulatory cardiac telemetry in order to evaluate for underlying cardiac arrhythmias, however symptoms are clearly consistent with neurocardiogenic syncope.  Patient's EKG reveals normal sinus rhythm without evidence of ischemia or underlying  injury pattern, her physical exam is unremarkable, and she is not orthostatic in the office today, although her blood pressure is soft.   Discussed at length with patient regarding physiology of her syncopal episodes, advised her regarding conservative therapy including increased salt intake.  Recommend patient drink Pedialyte on a daily basis as well as increase dietary salt intake.  Also recommend patient wear compression stockings whenever she will be up and active throughout the day.  Counseled patient regarding counterpressure maneuvers when she experiences prodromal symptoms, she verbalized understanding and agreement.  Recommend patient start vitamin supplementation as well including vitamin B1, vitamin B6, vitamin B12.  Provided reassurance to patient regarding etiology of syncopal episodes, she verbalized understanding.   Follow up in 6 weeks, sooner if needed, for syncope and palpitations.   Patient was seen in collaboration with Dr. Jacinto HalimGanji. He also reviewed patient's chart and examined the patient. Dr. Jacinto HalimGanji is in agreement of the plan.    Rayford HalstedCeleste C , PA-C 03/04/2021, 4:01 PM Office: [phone]

## 2021-03-04 NOTE — Patient Instructions (Signed)
B1 (50 mg), B6 (50 mg), B12 (soluable drops) supplements. Fish oil with major meal of the day. Drink Pedialyte daily.

## 2021-03-08 ENCOUNTER — Other Ambulatory Visit

## 2021-03-08 ENCOUNTER — Other Ambulatory Visit: Payer: Self-pay

## 2021-03-08 DIAGNOSIS — R55 Syncope and collapse: Secondary | ICD-10-CM

## 2021-04-21 NOTE — Progress Notes (Signed)
Primary Physician/Referring:  Henrine Screwshacker, Robert, MD  Patient ID: Meghan Barry, female    DOB: 1981/12/18, [age] y.o.   MRN: [id number]  Chief Complaint  Patient presents with  . Palpitations  . Fatigue  . Loss of Consciousness  . Follow-up    6 week  . 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  With history of hysterectomy, venous insufficiency [place], remote history of GI ulcer.  Denies history of hypertension, hyperlipidemia, diabetes, CAD, MI, TIA/CVA, DVT/PE.  She does report family history of coronary artery disease, her father died at the age of 39 due to MI.  Patient smoked approximately half pack per day for several years, quit in 2015.  Originally referred to our office by her PCP for evaluation of recurrent syncope and near syncope.  Patient presents for 6-week follow-up of neurocardiogenic syncope.  This visit advised patient regarding liberal hydration, support stockings, increase dietary salt intake as well as vitamin B1, B6, and B12. Patient states she has not started vitamin supplements, however she is wearing thigh high support stockings 75% of the time when she is up and moving. She has also been drinking more Gatorade and trying to say well hydrated. Patient continues to have episodes of orthostatic dizziness, but no recurrence of syncope.   She also continued to have episodes of palpitations. Recent cardiac monitor revealed patient symptoms correlated with PVC/PACs as well as a single episode of supraventricular tachycardia.   Patient is remains active, running 5 miles per day without issue.  She eats relatively healthy diet.  She denies chest pain, leg swelling, orthopnea, PND, dyspnea.   Past Medical History:  Diagnosis Date  . Anxiety   . Depression   . Stomach ulcer   . Venous insufficiency    Past Surgical History:  Procedure Laterality Date  .  ABDOMINAL HYSTERECTOMY    . anterior posterior vaginal wall repair    . BIOPSY  08/01/2018   Procedure: BIOPSY;  Surgeon: Lynann BolognaGupta, Rajesh, MD;  [facility];  Service: Endoscopy;;  . bladder tact    . BREAST CYST ASPIRATION Right   . CHOLECYSTECTOMY N/A 10/03/2018   Procedure: DIAGNOSTIC LAPAROSCOPY, LAPAROSCOPIC CHOLECYSTECTOMY;  Surgeon: Gaynelle AduWilson, Eric, MD;  Location: [facility] ORS;  Service: General;  Laterality: N/A;  . DILATION AND CURETTAGE OF UTERUS    . ESOPHAGOGASTRODUODENOSCOPY N/A 08/01/2018   Procedure: ESOPHAGOGASTRODUODENOSCOPY (EGD);  Surgeon: Lynann BolognaGupta, Rajesh, MD;  [facility];  Service: Endoscopy;  Laterality: N/A;  . left ovary removal    . OVARIAN CYST SURGERY    . TONSILLECTOMY    . WRIST SURGERY     Family History  Problem Relation Age of Onset  . Cancer Sister   . Breast cancer Maternal Grandmother   . Heart attack Mother     Social History   Tobacco Use  . Smoking status: Former Smoker    Packs/day: 0.50    Types: Cigarettes    Quit date: 12/25/2013    Years since quitting: 7.3  . Smokeless tobacco: Never Used  . Tobacco comment: quit smoking 2015  Substance Use Topics  . Alcohol use: No   Marital Status: Married  ROS  Review of Systems  Constitutional: Positive for malaise/fatigue. Negative for weight gain.  Cardiovascular: Positive for palpitations. Negative for chest pain, claudication, leg swelling, near-syncope, orthopnea, paroxysmal nocturnal dyspnea and syncope.  Respiratory:  Negative for shortness of breath.   Hematologic/Lymphatic: Does not bruise/bleed easily.  Gastrointestinal: Negative for melena.  Neurological: Positive for dizziness. Negative for weakness.   Objective  Temperature 98 F (36.7 C), resp. rate 16, height 5\' 9"  (1.753 m), weight 159 lb (72.1 kg), SpO2 100 %.  Vitals with BMI 04/22/2021 03/04/2021 01/08/2021  Height 5\' 9"  5\' 9"  -  Weight 159 lbs 167 lbs 10 oz -  BMI 23.47 24.74 -  Systolic - 111 101  Diastolic - 74 66   Pulse - 69 55    Orthostatic VS for the past 72 hrs (Last 3 readings):  Orthostatic BP Patient Position BP Location Cuff Size Orthostatic Pulse  04/22/21 1312 107/89 Standing Left Arm Normal 103  04/22/21 1311 121/85 Sitting Left Arm Normal 96  04/22/21 1310 117/78 Supine Left Arm Normal 85     Physical Exam Vitals reviewed.  Neck:     Vascular: No carotid bruit.  Cardiovascular:     Rate and Rhythm: Normal rate and regular rhythm.     Pulses: Intact distal pulses.          Carotid pulses are 2+ on the right side and 2+ on the left side.      Radial pulses are 2+ on the right side and 2+ on the left side.       Femoral pulses are 2+ on the right side and 2+ on the left side.      Popliteal pulses are 2+ on the right side and 2+ on the left side.       Dorsalis pedis pulses are 2+ on the right side and 2+ on the left side.       Posterior tibial pulses are 1+ on the right side and 1+ on the left side.     Heart sounds: S1 normal and S2 normal. No murmur heard. No gallop.   Pulmonary:     Effort: Pulmonary effort is normal. No respiratory distress.     Breath sounds: No wheezing, rhonchi or rales.  Abdominal:     General: Abdomen is flat. Bowel sounds are normal. There is no distension.     Palpations: Abdomen is soft.  Musculoskeletal:     Right lower leg: No edema.     Left lower leg: No edema.  Skin:    General: Skin is warm and dry.     Capillary Refill: Capillary refill takes less than 2 seconds.  Neurological:     Mental Status: She is alert.    Laboratory examination:   Recent Labs    10/29/20 2047 01/08/21 1921  NA 136 136  K 4.0 3.9  CL 98 101  CO2 27 26  GLUCOSE 93 113*  BUN 12 13  CREATININE 0.83 1.00  CALCIUM 9.4 9.2  GFRNONAA >60 >60   CrCl cannot be calculated (Patient's most recent lab result is older than the maximum 21 days allowed.).  CMP Latest Ref Rng & Units 01/08/2021 10/29/2020 10/03/2018  Glucose 70 - 99 mg/dL 01/10/2021) 93 13/04/2020)  BUN 6 -  20 mg/dL 13 12 7   Creatinine 0.44 - 1.00 mg/dL 12/03/2018 585(I 778(E  Sodium 135 - 145 mmol/L 136 136 139  Potassium 3.5 - 5.1 mmol/L 3.9 4.0 3.7  Chloride 98 - 111 mmol/L 101 98 108  CO2 22 - 32 mmol/L 26 27 23   Calcium 8.9 - 10.3 mg/dL 9.2 9.4 )  Total Protein 6.5 - 8.1 g/dL 6.6 7.6 6.3(L)  Total Bilirubin 0.3 -  1.2 mg/dL 0.8 0.7 1.0  Alkaline Phos 38 - 126 U/L 39 42 40  AST 15 - 41 U/L 15 16 24   ALT 0 - 44 U/L 11 12 14    CBC Latest Ref Rng & Units 01/08/2021 10/29/2020 10/03/2018  WBC 4.0 - 10.5 K/uL 4.8 4.3 4.4  Hemoglobin 12.0 - 15.0 g/dL 13/04/2020 12/03/2018 10.2(L)  Hematocrit 36.0 - 46.0 % 39.1 40.2 34.2(L)  Platelets 150 - 400 K/uL 285 301 182    Lipid Panel No results for input(s): CHOL, TRIG, LDLCALC, VLDL, HDL, CHOLHDL, LDLDIRECT in the last 8760 hours.  HEMOGLOBIN A1C No results found for: HGBA1C, MPG TSH No results for input(s): TSH in the last 8760 hours.  External labs:   None   Medications and allergies   Allergies  Allergen Reactions  . Bactrim [Sulfamethoxazole-Trimethoprim] Other (See Comments)    Mouth sores, stiff neck and H/A  . Ciprofloxacin Hives    flushed face  . Codeine Other (See Comments)    Flushed red face, migraines  . Dilaudid [Hydromorphone Hcl] Other (See Comments)    migraine  . Lunesta [Eszopiclone] Other (See Comments)    hallucinations  . Morphine And Related Other (See Comments)    Severe migraine   . Duloxetine Hcl     Other reaction(s): psychotic thoughts  . Amoxicillin Rash    Has patient had a PCN reaction causing immediate rash, facial/tongue/throat swelling, SOB or lightheadedness with hypotension: Yes Has patient had a PCN reaction causing severe rash involving mucus membranes or skin necrosis: No Has patient had a PCN reaction that required hospitalization: No Has patient had a PCN reaction occurring within the last 10 years: No If all of the above answers are "NO", then may proceed with Cephalosporin use.  34.1 Penicillins Rash     Has patient had a PCN reaction causing immediate rash, facial/tongue/throat swelling, SOB or lightheadedness with hypotension: Yes Has patient had a PCN reaction causing severe rash involving mucus membranes or skin necrosis: No Has patient had a PCN reaction that required hospitalization: No Has patient had a PCN reaction occurring within the last 10 years: No If all of the above answers are "NO", then may proceed with Cephalosporin use.     Outpatient Medications Prior to Visit  Medication Sig Dispense Refill  . buPROPion (WELLBUTRIN SR) 150 MG 12 hr tablet Take 150 mg by mouth 2 (two) times daily.    . Multiple Vitamin (MULTIVITAMIN) capsule Take 1 capsule by mouth daily.    93.7 VYVANSE 30 MG capsule Take 30 mg by mouth every morning.     No facility-administered medications prior to visit.    Radiology:   No results found.  Cardiac Studies:   Ambulatory cardiac telemetry 03/09/2019 2-3/292022-03/22/2021: Predominant rhythm was sinus with minimum heart rate 45 bpm, maximum heart rate 157 bpm, average heart rate 72 bpm.  Single episode of supraventricular tachycardia lasting 4 beats with a maximum rate of 143 bpm.  PACs and PVCs were rare.  No bigeminy or trigeminy, atrial fibrillation, pauses >3 seconds, V. Tach. Patient symptoms correlated with sinus rhythm, PAC/PVC, and episode of SVT.   EKG:   EKG 03/04/2021: Sinus rhythm at a rate of 70 bpm.  Normal axis.  Incomplete right bundle branch block.  Nonspecific T wave abnormality.  No evidence of ischemia or underlying injury pattern.  Assessment     ICD-10-CM   1. Neurocardiogenic syncope  R55   2. Palpitations  R00.2   3. Dizziness on standing  R42      There are no discontinued medications.  No orders of the defined types were placed in this encounter.  No orders of the defined types were placed in this encounter.   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  With history of hysterectomy, venous insufficiency status post ablation in [place], remote history of GI ulcer.  Denies history of hypertension, hyperlipidemia, diabetes, CAD, MI, TIA/CVA, DVT/PE.  She does report family history of coronary artery disease, her father died at the age of 57 due to MI.  Patient smoked approximately half pack per day for several years, quit in 2015.  Originally referred to our office by her PCP for evaluation of recurrent syncope and near syncope.  Patient presents for 6-week follow-up of neurocardiogenic syncope.  This visit advised patient regarding liberal hydration, support stockings, increase dietary salt intake as well as vitamin B1, B6, and B12. Reviewed and discussed results of cardiac monitor with patient, details above. Advised continued conservative measures for orthostatic dizziness. Could consider midodrine in the future if necessary, but patient is not orthostatic in the office today, although her blood pressure remains soft. Patient will notify our office if she has recurrence of syncope. Cardiac monitor revealed no significant arrhythmia as source of syncopal episodes. Patient's symptoms correlate primarily with PVC/PACs. Could consider low dose beta blocker in the future, however in view of soft blood pressure and orthostatic dizziness with hold off. Instead advised patient to start B vitamins.   Follow up in 3 months, sooner if needed, for dizziness, palpitations.    Rayford Halsted, PA-C 04/24/2021, 6:35 PM Office: [phone]

## 2021-04-22 ENCOUNTER — Other Ambulatory Visit: Payer: Self-pay

## 2021-04-22 ENCOUNTER — Encounter: Payer: Self-pay | Admitting: Student

## 2021-04-22 ENCOUNTER — Ambulatory Visit: Admitting: Student

## 2021-04-22 VITALS — Temp 98.0°F | Resp 16 | Ht 69.0 in | Wt 159.0 lb

## 2021-04-22 DIAGNOSIS — R55 Syncope and collapse: Secondary | ICD-10-CM

## 2021-04-22 DIAGNOSIS — R42 Dizziness and giddiness: Secondary | ICD-10-CM

## 2021-04-22 DIAGNOSIS — R002 Palpitations: Secondary | ICD-10-CM

## 2021-04-22 NOTE — Patient Instructions (Signed)
B1: 50-100 mg B6: 50-100 mg B12: 1000-1500 mcg

## 2021-07-19 NOTE — Progress Notes (Deleted)
Primary Physician/Referring:  Henrine Screws, MD  Patient ID: Meghan Barry, female    DOB: 1981-12-04, 40 y.o.   MRN: [id number]  No chief complaint on file.  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  With history of hysterectomy, venous insufficiency status post ablation in [place], remote history of GI ulcer.  Denies history of hypertension, hyperlipidemia, diabetes, CAD, MI, TIA/CVA, DVT/PE.  She does report family history of coronary artery disease, her father died at the age of 87 due to MI.  Patient smoked approximately half pack per day for several years, quit in 2015.  Originally referred to our office by her PCP for evaluation of recurrent syncope and near syncope.  Patient presents for 39-month follow-up of dizziness and palpitations.  At last visit advised patient to start taking vitamin B1, B6, and B12. 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  This visit advised patient regarding liberal hydration, support stockings, increase dietary salt intake as well as vitamin B1, B6, and B12. Patient states she has not started vitamin supplements, however she is wearing thigh high support stockings 75% of the time when she is up and moving. She has also been drinking more Gatorade and trying to say well hydrated. Patient continues to have episodes of orthostatic dizziness, but no recurrence of syncope.   She also continued to have episodes of palpitations. Recent cardiac monitor revealed patient symptoms correlated with PVC/PACs as well as a single episode of supraventricular tachycardia.   Patient is remains active, running 5 miles per day without issue.  She eats relatively healthy diet.  She denies chest pain, leg swelling, orthopnea, PND, dyspnea.   Past Medical History:  Diagnosis Date   Anxiety    Depression    Stomach ulcer    Venous insufficiency    Past Surgical History:   Procedure Laterality Date   ABDOMINAL HYSTERECTOMY     anterior posterior vaginal wall repair     BIOPSY  08/01/2018   Procedure: BIOPSY;  Surgeon: Lynann Bologna, MD;  Location: [facility] ENDOSCOPY;  Service: Endoscopy;;   bladder tact     BREAST CYST ASPIRATION Right    CHOLECYSTECTOMY N/A 10/03/2018   Procedure: DIAGNOSTIC LAPAROSCOPY, LAPAROSCOPIC CHOLECYSTECTOMY;  Surgeon: Gaynelle Adu, MD;  Location: [facility] ORS;  Service: General;  Laterality: N/A;   DILATION AND CURETTAGE OF UTERUS     ESOPHAGOGASTRODUODENOSCOPY N/A 08/01/2018   Procedure: ESOPHAGOGASTRODUODENOSCOPY (EGD);  Surgeon: Lynann Bologna, MD;  Location: [facility] END[facility]COPY;  Service: Endoscopy;  Laterality: N/A;   left ovary removal     OVARIAN CYST SURGERY     TONSILLECTOMY     WRIST SURGERY     Family History  Problem Relation Age of Onset   Cancer Sister    Breast cancer Maternal Grandmother    Heart attack Mother     Social History   Tobacco Use   Smoking status: Former    Packs/day: 0.50    Types: Cigarettes    Quit date: 12/25/2013    Years since quitting: 7.5   Smokeless tobacco: Never   Tobacco comments:    quit smoking 2015  Substance Use Topics   Alcohol use: No   Marital Status: Married  ROS  Review of Systems  Constitutional: Positive for malaise/fatigue. Negative for weight gain.  Cardiovascular:  Positive for palpitations. Negative for chest pain, claudication, leg swelling, near-syncope, orthopnea, paroxysmal nocturnal dyspnea and syncope.  Respiratory:  Negative for shortness of breath.   Hematologic/Lymphatic: Does not bruise/bleed easily.  Gastrointestinal:  Negative for melena.  Neurological:  Positive for dizziness. Negative for weakness.  Objective  There were no vitals taken for this visit.  Vitals with BMI 04/22/2021 03/04/2021 01/08/2021  Height 5\' 9"  5\' 9"  -  Weight 159 lbs 167 lbs 10 oz -  BMI 23.47 24.74 -  Systolic - 111 101  Diastolic - 74 66  Pulse - 69 55    No data found.    Physical  Exam Vitals reviewed.  Neck:     Vascular: No carotid bruit.  Cardiovascular:     Rate and Rhythm: Normal rate and regular rhythm.     Pulses: Intact distal pulses.          Carotid pulses are 2+ on the right side and 2+ on the left side.      Radial pulses are 2+ on the right side and 2+ on the left side.       Femoral pulses are 2+ on the right side and 2+ on the left side.      Popliteal pulses are 2+ on the right side and 2+ on the left side.       Dorsalis pedis pulses are 2+ on the right side and 2+ on the left side.       Posterior tibial pulses are 1+ on the right side and 1+ on the left side.     Heart sounds: S1 normal and S2 normal. No murmur heard.   No gallop.  Pulmonary:     Effort: Pulmonary effort is normal. No respiratory distress.     Breath sounds: No wheezing, rhonchi or rales.  Abdominal:     General: Abdomen is flat. Bowel sounds are normal. There is no distension.     Palpations: Abdomen is soft.  Musculoskeletal:     Right lower leg: No edema.     Left lower leg: No edema.  Skin:    General: Skin is warm and dry.     Capillary Refill: Capillary refill takes less than 2 seconds.  Neurological:     Mental Status: She is alert.   Laboratory examination:   Recent Labs    10/29/20 2047 01/08/21 1921  NA 136 136  K 4.0 3.9  CL 98 101  CO2 27 26  GLUCOSE 93 113*  BUN 12 13  CREATININE 0.83 1.00  CALCIUM 9.4 9.2  GFRNONAA >60 >60    CrCl cannot be calculated (Patient's most recent lab result is older than the maximum 21 days allowed.).  CMP Latest Ref Rng & Units 01/08/2021 10/29/2020 10/03/2018  Glucose 70 - 99 mg/dL 13/04/2020) 93 12/03/2018)  BUN 6 - 20 mg/dL 13 12 7   Creatinine 0.44 - 1.00 mg/dL 161(W 960(A  Sodium 135 - 145 mmol/L 136 136 139  Potassium 3.5 - 5.1 mmol/L 3.9 4.0 3.7  Chloride 98 - 111 mmol/L 101 98 108  CO2 22 - 32 mmol/L 26 27 23   Calcium 8.9 - 10.3 mg/dL 9.2 9.4 5.40)  Total Protein 6.5 - 8.1 g/dL 6.6 7.6 6.3(L)  Total Bilirubin  0.3 - 1.2 mg/dL 0.8 0.7 1.0  Alkaline Phos 38 - 126 U/L 39 42 40  AST 15 - 41 U/L 15 16 24   ALT 0 - 44 U/L 11 12 14    CBC Latest Ref Rng & Units 01/08/2021 10/29/2020 10/03/2018  WBC 4.0 - 10.5 K/uL 4.8 4.3 4.4  Hemoglobin  12.0 - 15.0 g/dL 40.913.4 81.113.7 10.2(L)  Hematocrit 36.0 - 46.0 % 39.1 40.2 34.2(L)  Platelets 150 - 400 K/uL 285 301 182    Lipid Panel No results for input(s): CHOL, TRIG, LDLCALC, VLDL, HDL, CHOLHDL, LDLDIRECT in the last 8760 hours.  HEMOGLOBIN A1C No results found for: HGBA1C, MPG TSH No results for input(s): TSH in the last 8760 hours.  External labs:   None  Allergies   Allergies  Allergen Reactions   Bactrim [Sulfamethoxazole-Trimethoprim] Other (See Comments)    Mouth sores, stiff neck and H/A   Ciprofloxacin Hives    flushed face   Codeine Other (See Comments)    Flushed red face, migraines   Dilaudid [Hydromorphone Hcl] Other (See Comments)    migraine   Lunesta [Eszopiclone] Other (See Comments)    hallucinations   Morphine And Related Other (See Comments)    Severe migraine    Duloxetine Hcl     Other reaction(s): psychotic thoughts   Amoxicillin Rash    Has patient had a PCN reaction causing immediate rash, facial/tongue/throat swelling, SOB or lightheadedness with hypotension: Yes Has patient had a PCN reaction causing severe rash involving mucus membranes or skin necrosis: No Has patient had a PCN reaction that required hospitalization: No Has patient had a PCN reaction occurring within the last 10 years: No If all of the above answers are "NO", then may proceed with Cephalosporin use.   Penicillins Rash    Has patient had a PCN reaction causing immediate rash, facial/tongue/throat swelling, SOB or lightheadedness with hypotension: Yes Has patient had a PCN reaction causing severe rash involving mucus membranes or skin necrosis: No Has patient had a PCN reaction that required hospitalization: No Has patient had a PCN reaction occurring  within the last 10 years: No If all of the above answers are "NO", then may proceed with Cephalosporin use.      Medications Prior to Visit:   Outpatient Medications Prior to Visit  Medication Sig Dispense Refill   buPROPion (WELLBUTRIN SR) 150 MG 12 hr tablet Take 150 mg by mouth 2 (two) times daily.     Multiple Vitamin (MULTIVITAMIN) capsule Take 1 capsule by mouth daily.     VYVANSE 30 MG capsule Take 30 mg by mouth every morning.     No facility-administered medications prior to visit.     Final Medications at End of Visit    No outpatient medications have been marked as taking for the 07/22/21 encounter (Appointment) with Carlynn Purlantwell, Aveen Stansel C, PA-C.   Radiology:   No results found.  Cardiac Studies:   Ambulatory cardiac telemetry 03/09/2019 2-3/292022-03/22/2021: Predominant rhythm was sinus with minimum heart rate 45 bpm, maximum heart rate 157 bpm, average heart rate 72 bpm.  Single episode of supraventricular tachycardia lasting 4 beats with a maximum rate of 143 bpm.  PACs and PVCs were rare.  No bigeminy or trigeminy, atrial fibrillation, pauses >3 seconds, V. Tach. Patient symptoms correlated with sinus rhythm, PAC/PVC, and episode of SVT.   EKG:   EKG 03/04/2021: Sinus rhythm at a rate of 70 bpm.  Normal axis.  Incomplete right bundle branch block.  Nonspecific T wave abnormality.  No evidence of ischemia or underlying injury pattern.  Assessment   No diagnosis found.    There are no discontinued medications.  No orders of the defined types were placed in this encounter.  No orders of the defined types were placed in this encounter.   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  With history of hysterectomy, venous insufficiency status post ablation in [place], remote history of GI ulcer.  Denies history of hypertension, hyperlipidemia, diabetes, CAD, MI, TIA/CVA, DVT/PE.  She does report family  history of coronary artery disease, her father died at the age of 10 due to MI.  Patient smoked approximately half pack per day for several years, quit in 2015.  Originally referred to our office by her PCP for evaluation of recurrent syncope and near syncope.  Patient presents for 67-month follow-up of dizziness and palpitations.  At last visit advised patient to start taking vitamin B1, B6, and B12. 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  This visit advised patient regarding liberal hydration, support stockings, increase dietary salt intake as well as vitamin B1, B6, and B12. Reviewed and discussed results of cardiac monitor with patient, details above. Advised continued conservative measures for orthostatic dizziness. Could consider midodrine in the future if necessary, but patient is not orthostatic in the office today, although her blood pressure remains soft. Patient will notify our office if she has recurrence of syncope. Cardiac monitor revealed no significant arrhythmia as source of syncopal episodes. Patient's symptoms correlate primarily with PVC/PACs. Could consider low dose beta blocker in the future, however in view of soft blood pressure and orthostatic dizziness with hold off. Instead advised patient to start B vitamins.   Follow up in 3 months, sooner if needed, for dizziness, palpitations.    Rayford Halsted, PA-C 07/19/2021, 1:53 PM Office: [phone]

## 2021-07-22 ENCOUNTER — Ambulatory Visit: Admitting: Student

## 2021-07-22 DIAGNOSIS — R55 Syncope and collapse: Secondary | ICD-10-CM

## 2021-07-22 DIAGNOSIS — R002 Palpitations: Secondary | ICD-10-CM

## 2021-08-01 ENCOUNTER — Ambulatory Visit: Admitting: Student

## 2021-08-01 NOTE — Progress Notes (Deleted)
Primary Physician/Referring:  Henrine Screws, MD  Patient ID: Meghan Barry, female    DOB: 29-May-1981, 40 y.o.   MRN: [id number]  No chief complaint on file.  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  With history of hysterectomy, venous insufficiency status post ablation in [place], remote history of GI ulcer.  Denies history of hypertension, hyperlipidemia, diabetes, CAD, MI, TIA/CVA, DVT/PE.  She does report family history of coronary artery disease, her father died at the age of 9 due to MI.  Patient smoked approximately half pack per day for several years, quit in 2015.  Originally referred to our office by her PCP for evaluation of recurrent syncope and near syncope.  Patient presents for 89-month follow-up of dizziness and palpitations.  At last visit advised patient to start taking vitamin B1, B6, and B12. ***  ***  Patient presents for 6-week follow-up of neurocardiogenic syncope.  This visit advised patient regarding liberal hydration, support stockings, increase dietary salt intake as well as vitamin B1, B6, and B12. Patient states she has not started vitamin supplements, however she is wearing thigh high support stockings 75% of the time when she is up and moving. She has also been drinking more Gatorade and trying to say well hydrated. Patient continues to have episodes of orthostatic dizziness, but no recurrence of syncope.   She also continued to have episodes of palpitations. Recent cardiac monitor revealed patient symptoms correlated with PVC/PACs as well as a single episode of supraventricular tachycardia.   Patient is remains active, running 5 miles per day without issue.  She eats relatively healthy diet.  She denies chest pain, leg swelling, orthopnea, PND, dyspnea.   Past Medical History:  Diagnosis Date   Anxiety    Depression    Stomach ulcer    Venous insufficiency    Past Surgical History:   Procedure Laterality Date   ABDOMINAL HYSTERECTOMY     anterior posterior vaginal wall repair     BIOPSY  08/01/2018   Procedure: BIOPSY;  Surgeon: Lynann Bologna, MD;  Location: [facility] ENDOSCOPY;  Service: Endoscopy;;   bladder tact     BREAST CYST ASPIRATION Right    CHOLECYSTECTOMY N/A 10/03/2018   Procedure: DIAGNOSTIC LAPAROSCOPY, LAPAROSCOPIC CHOLECYSTECTOMY;  Surgeon: Gaynelle Adu, MD;  Location: [facility] ORS;  Service: General;  Laterality: N/A;   DILATION AND CURETTAGE OF UTERUS     ESOPHAGOGASTRODUODENOSCOPY N/A 08/01/2018   Procedure: ESOPHAGOGASTRODUODENOSCOPY (EGD);  Surgeon: Lynann Bologna, MD;  Location: [facility] ENDOSCOPY;  Service: Endoscopy;  Laterality: N/A;   left ovary removal     OVARIAN CYST SURGERY     TONSILLECTOMY     WRIST SURGERY     Family History  Problem Relation Age of Onset   Cancer Sister    Breast cancer Maternal Grandmother    Heart attack Mother     Social History   Tobacco Use   Smoking status: Former    Packs/day: 0.50    Types: Cigarettes    Quit date: 12/25/2013    Years since quitting: 7.6   Smokeless tobacco: Never   Tobacco comments:    quit smoking 2015  Substance Use Topics   Alcohol use: No   Marital Status: Married  ROS  Review of Systems  Constitutional: Positive for malaise/fatigue. Negative for weight gain.  Cardiovascular:  Positive for palpitations. Negative for chest pain, claudication, leg swelling, near-syncope, orthopnea, paroxysmal nocturnal dyspnea and syncope.  Respiratory:  Negative for shortness of breath.   Hematologic/Lymphatic: Does not bruise/bleed easily.  Gastrointestinal:  Negative for melena.  Neurological:  Positive for dizziness. Negative for weakness.  Objective  There were no vitals taken for this visit.  Vitals with BMI 04/22/2021 03/04/2021 01/08/2021  Height 5\' 9"  5\' 9"  -  Weight 159 lbs 167 lbs 10 oz -  BMI 23.47 24.74 -  Systolic - 111 101  Diastolic - 74 66  Pulse - 69 55    No data found.    Physical  Exam Vitals reviewed.  Neck:     Vascular: No carotid bruit.  Cardiovascular:     Rate and Rhythm: Normal rate and regular rhythm.     Pulses: Intact distal pulses.          Carotid pulses are 2+ on the right side and 2+ on the left side.      Radial pulses are 2+ on the right side and 2+ on the left side.       Femoral pulses are 2+ on the right side and 2+ on the left side.      Popliteal pulses are 2+ on the right side and 2+ on the left side.       Dorsalis pedis pulses are 2+ on the right side and 2+ on the left side.       Posterior tibial pulses are 1+ on the right side and 1+ on the left side.     Heart sounds: S1 normal and S2 normal. No murmur heard.   No gallop.  Pulmonary:     Effort: Pulmonary effort is normal. No respiratory distress.     Breath sounds: No wheezing, rhonchi or rales.  Abdominal:     General: Abdomen is flat. Bowel sounds are normal. There is no distension.     Palpations: Abdomen is soft.  Musculoskeletal:     Right lower leg: No edema.     Left lower leg: No edema.  Skin:    General: Skin is warm and dry.     Capillary Refill: Capillary refill takes less than 2 seconds.  Neurological:     Mental Status: She is alert.   Laboratory examination:   Recent Labs    10/29/20 2047 01/08/21 1921  NA 136 136  K 4.0 3.9  CL 98 101  CO2 27 26  GLUCOSE 93 113*  BUN 12 13  CREATININE 0.83 1.00  CALCIUM 9.4 9.2  GFRNONAA >60 >60    CrCl cannot be calculated (Patient's most recent lab result is older than the maximum 21 days allowed.).  CMP Latest Ref Rng & Units 01/08/2021 10/29/2020 10/03/2018  Glucose 70 - 99 mg/dL 13/04/2020) 93 12/03/2018)  BUN 6 - 20 mg/dL 13 12 7   Creatinine 0.44 - 1.00 mg/dL 161(W 960(A  Sodium 135 - 145 mmol/L 136 136 139  Potassium 3.5 - 5.1 mmol/L 3.9 4.0 3.7  Chloride 98 - 111 mmol/L 101 98 108  CO2 22 - 32 mmol/L 26 27 23   Calcium 8.9 - 10.3 mg/dL 9.2 9.4 5.40)  Total Protein 6.5 - 8.1 g/dL 6.6 7.6 6.3(L)  Total Bilirubin  0.3 - 1.2 mg/dL 0.8 0.7 1.0  Alkaline Phos 38 - 126 U/L 39 42 40  AST 15 - 41 U/L 15 16 24   ALT 0 - 44 U/L 11 12 14    CBC Latest Ref Rng & Units 01/08/2021 10/29/2020 10/03/2018  WBC 4.0 - 10.5 K/uL 4.8 4.3 4.4  Hemoglobin  12.0 - 15.0 g/dL 69.6 29.5 10.2(L)  Hematocrit 36.0 - 46.0 % 39.1 40.2 34.2(L)  Platelets 150 - 400 K/uL 285 301 182    Lipid Panel No results for input(s): CHOL, TRIG, LDLCALC, VLDL, HDL, CHOLHDL, LDLDIRECT in the last 8760 hours.  HEMOGLOBIN A1C No results found for: HGBA1C, MPG TSH No results for input(s): TSH in the last 8760 hours.  External labs:   None  Allergies   Allergies  Allergen Reactions   Bactrim [Sulfamethoxazole-Trimethoprim] Other (See Comments)    Mouth sores, stiff neck and H/A   Ciprofloxacin Hives    flushed face   Codeine Other (See Comments)    Flushed red face, migraines   Dilaudid [Hydromorphone Hcl] Other (See Comments)    migraine   Lunesta [Eszopiclone] Other (See Comments)    hallucinations   Morphine And Related Other (See Comments)    Severe migraine    Duloxetine Hcl     Other reaction(s): psychotic thoughts   Amoxicillin Rash    Has patient had a PCN reaction causing immediate rash, facial/tongue/throat swelling, SOB or lightheadedness with hypotension: Yes Has patient had a PCN reaction causing severe rash involving mucus membranes or skin necrosis: No Has patient had a PCN reaction that required hospitalization: No Has patient had a PCN reaction occurring within the last 10 years: No If all of the above answers are "NO", then may proceed with Cephalosporin use.   Penicillins Rash    Has patient had a PCN reaction causing immediate rash, facial/tongue/throat swelling, SOB or lightheadedness with hypotension: Yes Has patient had a PCN reaction causing severe rash involving mucus membranes or skin necrosis: No Has patient had a PCN reaction that required hospitalization: No Has patient had a PCN reaction occurring  within the last 10 years: No If all of the above answers are "NO", then may proceed with Cephalosporin use.      Medications Prior to Visit:   Outpatient Medications Prior to Visit  Medication Sig Dispense Refill   buPROPion (WELLBUTRIN SR) 150 MG 12 hr tablet Take 150 mg by mouth 2 (two) times daily.     Multiple Vitamin (MULTIVITAMIN) capsule Take 1 capsule by mouth daily.     VYVANSE 30 MG capsule Take 30 mg by mouth every morning.     No facility-administered medications prior to visit.     Final Medications at End of Visit    No outpatient medications have been marked as taking for the 08/01/21 encounter (Appointment) with Carlynn Purl, Darthy Manganelli C, PA-C.   Radiology:   No results found.  Cardiac Studies:   Ambulatory cardiac telemetry 03/09/2019 2-3/292022-03/22/2021: Predominant rhythm was sinus with minimum heart rate 45 bpm, maximum heart rate 157 bpm, average heart rate 72 bpm.  Single episode of supraventricular tachycardia lasting 4 beats with a maximum rate of 143 bpm.  PACs and PVCs were rare.  No bigeminy or trigeminy, atrial fibrillation, pauses >3 seconds, V. Tach. Patient symptoms correlated with sinus rhythm, PAC/PVC, and episode of SVT.   EKG:   EKG 03/04/2021: Sinus rhythm at a rate of 70 bpm.  Normal axis.  Incomplete right bundle branch block.  Nonspecific T wave abnormality.  No evidence of ischemia or underlying injury pattern.  Assessment   No diagnosis found.    There are no discontinued medications.  No orders of the defined types were placed in this encounter.  No orders of the defined types were placed in this encounter.   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  With history of hysterectomy, venous insufficiency status post ablation in [place], remote history of GI ulcer.  Denies history of hypertension, hyperlipidemia, diabetes, CAD, MI, TIA/CVA, DVT/PE.  She does report family  history of coronary artery disease, her father died at the age of 85 due to MI.  Patient smoked approximately half pack per day for several years, quit in 2015.  Originally referred to our office by her PCP for evaluation of recurrent syncope and near syncope.  Patient presents for 19-month follow-up of dizziness and palpitations.  At last visit advised patient to start taking vitamin B1, B6, and B12. ***  ***  ***  Patient presents for 6-week follow-up of neurocardiogenic syncope.  This visit advised patient regarding liberal hydration, support stockings, increase dietary salt intake as well as vitamin B1, B6, and B12. Reviewed and discussed results of cardiac monitor with patient, details above. Advised continued conservative measures for orthostatic dizziness. Could consider midodrine in the future if necessary, but patient is not orthostatic in the office today, although her blood pressure remains soft. Patient will notify our office if she has recurrence of syncope. Cardiac monitor revealed no significant arrhythmia as source of syncopal episodes. Patient's symptoms correlate primarily with PVC/PACs. Could consider low dose beta blocker in the future, however in view of soft blood pressure and orthostatic dizziness with hold off. Instead advised patient to start B vitamins.   Follow up in 3 months, sooner if needed, for dizziness, palpitations.    Rayford Halsted, PA-C 08/01/2021, 8:39 AM Office: [phone]

## 2021-10-13 ENCOUNTER — Other Ambulatory Visit (HOSPITAL_COMMUNITY): Payer: Self-pay | Admitting: Gastroenterology

## 2021-10-13 ENCOUNTER — Other Ambulatory Visit: Payer: Self-pay | Admitting: Gastroenterology

## 2021-10-13 DIAGNOSIS — R112 Nausea with vomiting, unspecified: Secondary | ICD-10-CM

## 2021-10-20 ENCOUNTER — Ambulatory Visit (HOSPITAL_COMMUNITY)

## 2021-10-24 ENCOUNTER — Encounter (HOSPITAL_COMMUNITY)

## 2021-10-27 ENCOUNTER — Other Ambulatory Visit: Payer: Self-pay

## 2021-10-27 ENCOUNTER — Encounter (HOSPITAL_COMMUNITY)
Admission: RE | Admit: 2021-10-27 | Discharge: 2021-10-27 | Disposition: A | Discharge status: Home / Self Care | Source: Ambulatory Visit | Attending: Gastroenterology | Admitting: Gastroenterology

## 2021-10-27 DIAGNOSIS — R112 Nausea with vomiting, unspecified: Secondary | ICD-10-CM | POA: Diagnosis present

## 2021-10-27 MED ORDER — TECHNETIUM TC 99M SULFUR COLLOID
2.2000 | Freq: Once | INTRAVENOUS | Status: AC | PRN
  Administered 2021-10-27: 2.2 via INTRAVENOUS

## 2021-10-28 ENCOUNTER — Other Ambulatory Visit: Payer: Self-pay | Admitting: Gastroenterology

## 2021-10-28 ENCOUNTER — Ambulatory Visit
Admission: RE | Admit: 2021-10-28 | Discharge: 2021-10-28 | Disposition: A | Discharge status: Home / Self Care | Source: Ambulatory Visit | Attending: Gastroenterology | Admitting: Gastroenterology

## 2021-10-28 DIAGNOSIS — R109 Unspecified abdominal pain: Secondary | ICD-10-CM

## 2021-10-28 MED ORDER — IOPAMIDOL (ISOVUE-370) INJECTION 76%
80.0000 mL | Freq: Once | INTRAVENOUS | Status: AC | PRN
  Administered 2021-10-28: 80 mL via INTRAVENOUS

## 2021-11-09 ENCOUNTER — Encounter: Payer: Self-pay | Admitting: *Deleted

## 2021-11-10 ENCOUNTER — Encounter: Payer: Self-pay | Admitting: Psychiatry

## 2021-11-10 ENCOUNTER — Ambulatory Visit (INDEPENDENT_AMBULATORY_CARE_PROVIDER_SITE_OTHER): Admitting: Psychiatry

## 2021-11-10 ENCOUNTER — Telehealth: Payer: Self-pay | Admitting: Psychiatry

## 2021-11-10 VITALS — BP 118/71 | HR 64 | Ht 69.0 in | Wt 150.0 lb

## 2021-11-10 DIAGNOSIS — R413 Other amnesia: Secondary | ICD-10-CM | POA: Diagnosis not present

## 2021-11-10 NOTE — Telephone Encounter (Signed)
[facility] order sent to GI, they will reach out to the patient to schedule.

## 2021-11-10 NOTE — Progress Notes (Signed)
[facility]  PATIENT: Meghan Barry DOB: [birth year]  REFERRING CLINICIAN: Stamey, Verda Cumins, FNP HISTORY FROM: self REASON FOR VISIT: memory loss   HISTORICAL  CHIEF COMPLAINT:  Chief Complaint  Patient presents with   New Patient (Initial Visit)    RM 1, alone. C/o memory loss x 5-6 months. Forgetfulness. C/o daily headaches.    HISTORY OF PRESENT ILLNESS:  The patient presents for evaluation of memory loss which has been present since June of this year. She feels increasingly forgetful. Will be driving and suddenly forget where she was going. Will forget what she is doing and forget about appointments. Loses her phone frequently. Feels this has been getting worse over time. 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  Since June she has also experienced intermittent headaches which come and go twice a day every day. This is described as a bilateral deep ache in her head. Sometimes associated with blurred vision and nausea. They last 10-20 minutes at a time. She does not take anything when they come on. No photophobia or phonophobia. Worse with bending forward.  Feels balance is off occasionally. Has been dropping things more recently.  Has had stressors in her life, father passed a year and a half ago and sister passed in 2013. Nothing occurred in June that she can identify as a trigger.  TBI:  No past history of TBI Stroke:  no past history of stroke Seizures:  no past history of seizures Sleep: Sleeps 5-6 hours per night. No history of sleep apnea.   Mood:  history of anxiety and depression, no issues right now  Functional status: Cooking: has left the stove on twice Shopping: will forget items at the store, will have a list that she sometimes forgets Driving: sometimes gets lost while driving Bills: husband takes care of the bills Medications: does not forget to take medications Forgetting  loved ones names?: no  Word finding difficulty? yes  OTHER MEDICAL CONDITIONS: neurocardiogenic syncope   REVIEW OF SYSTEMS: Full 14 system review of systems performed and negative with exception of: memory loss, headaches  ALLERGIES: Allergies  Allergen Reactions   Bactrim [Sulfamethoxazole-Trimethoprim] Other (See Comments)    Mouth sores, stiff neck and H/A   Ciprofloxacin Hives    flushed face   Codeine Other (See Comments)    Flushed red face, migraines   Dilaudid [Hydromorphone Hcl] Other (See Comments)    migraine   Lunesta [Eszopiclone] Other (See Comments)    hallucinations   Morphine And Related Other (See Comments)    Severe migraine    Aripiprazole     Other reaction(s): hallucinations, Other (See Comments)   Duloxetine Hcl     Other reaction(s): psychotic thoughts   Hydrocodone-Acetaminophen     Other reaction(s): Migraines   Amoxicillin Rash    Has patient had a PCN reaction causing immediate rash, facial/tongue/throat swelling, SOB or lightheadedness with hypotension: Yes Has patient had a PCN reaction causing severe rash involving mucus membranes or skin necrosis: No Has patient had a PCN reaction that required hospitalization: No Has patient had a PCN reaction occurring within the last 10 years: No If all of the above answers are "NO", then may proceed with Cephalosporin use.   Penicillins Rash    Has patient had a PCN reaction causing immediate rash, facial/tongue/throat swelling, SOB or lightheadedness with hypotension: Yes Has patient had a PCN reaction causing severe rash involving mucus membranes or skin  necrosis: No Has patient had a PCN reaction that required hospitalization: No Has patient had a PCN reaction occurring within the last 10 years: No If all of the above answers are "NO", then may proceed with Cephalosporin use.    HOME MEDICATIONS: Outpatient Medications Prior to Visit  Medication Sig Dispense Refill   Cyanocobalamin (B-12 PO) Take  by mouth.     Multiple Vitamin (MULTIVITAMIN) capsule Take 1 capsule by mouth daily.     VYVANSE 30 MG capsule Take 30 mg by mouth every morning.     buPROPion (WELLBUTRIN SR) 150 MG 12 hr tablet Take 150 mg by mouth 2 (two) times daily.     No facility-administered medications prior to visit.    PAST MEDICAL HISTORY: Past Medical History:  Diagnosis Date   Anxiety    Bilateral ovarian cysts    Depression    Insomnia    Stomach ulcer    Venous insufficiency    Venous insufficiency    ablations l leg 2009.,2011    PAST SURGICAL HISTORY: Past Surgical History:  Procedure Laterality Date   ABDOMINAL HYSTERECTOMY  08/2017   anterior posterior vaginal wall repair     BIOPSY  08/01/2018   Procedure: BIOPSY;  Surgeon: Lynann Bologna, MD;  Location: [facility] ENDOSCOPY;  Service: Endoscopy;;   bladder tact  2018   BREAST CYST ASPIRATION Right    CHOLECYSTECTOMY N/A 10/03/2018   Procedure: DIAGNOSTIC LAPAROSCOPY, LAPAROSCOPIC CHOLECYSTECTOMY;  Surgeon: Gaynelle Adu, MD;  Location: [facility] ORS;  Service: General;  Laterality: N/A;   DILATION AND CURETTAGE OF UTERUS     ESOPHAGOGASTRODUODENOSCOPY N/A 08/01/2018   Procedure: ESOPHAGOGASTRODUODENOSCOPY (EGD);  Surgeon: Lynann Bologna, MD;  Location: [facility] ENDOSCOPY;  Service: Endoscopy;  Laterality: N/A;   left ovary removal  2011   OVARIAN CYST SURGERY     TONSILLECTOMY  1988   WRIST SURGERY  11/2015   bone graft    FAMILY HISTORY: Family History  Problem Relation Age of Onset   Cancer Sister    Breast cancer Maternal Grandmother    Heart attack Mother     SOCIAL HISTORY: Social History   Socioeconomic History   Marital status: Married    Spouse name: Not on file   Number of children: 2   Years of education: Not on file   Highest education level: Bachelor's degree (e.g., BA, AB, BS)  Occupational History    Comment: Critical care RN  Tobacco Use   Smoking status: Former    Packs/day: 0.50    Types: Cigarettes    Quit date:  12/25/2013    Years since quitting: 7.8   Smokeless tobacco: Never   Tobacco comments:    quit smoking 2015  Vaping Use   Vaping Use: Never used  Substance and Sexual Activity   Alcohol use: No   Drug use: No   Sexual activity: Not Currently  Other Topics Concern   Not on file  Social History Narrative   One adopted son   Caffeine- 2 a day   Social Determinants of Health   Financial Resource Strain: Not on file  Food Insecurity: Not on file  Transportation Needs: Not on file  Physical Activity: Not on file  Stress: Not on file  Social Connections: Not on file  Intimate Partner Violence: Not on file     PHYSICAL EXAM  GENERAL EXAM/CONSTITUTIONAL: Vitals:  Vitals:   11/10/21 0817  BP: 118/71  Pulse: 64  Weight: 150 lb (68 kg)  Height: 5\' 9"  (1.753  m)   Body mass index is 22.15 kg/m. Wt Readings from Last 3 Encounters:  11/10/21 150 lb (68 kg)  04/22/21 159 lb (72.1 kg)  03/04/21 167 lb 9.6 oz (76 kg)   Patient is in no distress; well developed, nourished and groomed; neck is supple  CARDIOVASCULAR: Examination of peripheral vascular system by observation and palpation is normal  EYES: Pupils round and reactive to light, Visual fields full to confrontation, Extraocular movements intact  MUSCULOSKELETAL: Gait, strength, tone, movements noted in Neurologic exam below  NEUROLOGIC: MENTAL STATUS:  MMSE - Mini Mental State Exam 11/10/2021  Orientation to time 5  Orientation to Place 5  Registration 3  Attention/ Calculation 3  Recall 2  Language- name 2 objects 2  Language- repeat 1  Language- follow 3 step command 3  Language- read & follow direction 1  Write a sentence 1  Copy design 1  Total score 27    CRANIAL NERVE:  2nd, 3rd, 4th, 6th - pupils equal and reactive to light, visual fields full to confrontation, extraocular muscles intact, no nystagmus 5th - diminished sensation right V1-V3 7th - facial strength symmetric 8th - hearing  intact 9th - palate elevates symmetrically, uvula midline 11th - shoulder shrug symmetric 12th - tongue protrusion midline  MOTOR:  normal bulk and tone, no cogwheeling, full strength in the BUE, BLE  SENSORY:  normal and symmetric to light touch all 4 extremities  COORDINATION:  finger-nose-finger, heel-to-shin intact  REFLEXES:  deep tendon reflexes present and symmetric  GAIT/STATION:  normal     DIAGNOSTIC DATA (LABS, IMAGING, TESTING) - I reviewed patient records, labs, notes, testing and imaging myself where available.  Lab Results  Component Value Date   WBC 4.8 01/08/2021   HGB 13.4 01/08/2021   HCT 39.1 01/08/2021   MCV 93.5 01/08/2021   PLT 285 01/08/2021      Component Value Date/Time   NA 136 01/08/2021 1921   K 3.9 01/08/2021 1921   CL 101 01/08/2021 1921   CO2 26 01/08/2021 1921   GLUCOSE 113 (H) 01/08/2021 1921   BUN 13 01/08/2021 1921   CREATININE 1.00 01/08/2021 1921   CALCIUM 9.2 01/08/2021 1921   PROT 6.6 01/08/2021 1921   ALBUMIN 4.0 01/08/2021 1921   AST 15 01/08/2021 1921   ALT 11 01/08/2021 1921   ALKPHOS 39 01/08/2021 1921   BILITOT 0.8 01/08/2021 1921   GFRNONAA >60 01/08/2021 1921   GFRAA >60 10/03/2018 0448   No results found for: CHOL, HDL, LDLCALC, LDLDIRECT, TRIG, CHOLHDL No results found for: WPVX4I Lab Results  Component Value Date   VITAMINB12 466 07/31/2018   09/2021 TSH 3.79   ASSESSMENT AND PLAN  40 y.o. year old female with a history of neurocardiogenic syncope who presents for evaluation of memory loss and headaches since June 2022. Her neurologic exam is significant for decreased sensation over right V1-3. MMSE today is within normal limits, though she demonstrates clear impairment of her functioning due to her memory issues. MRI brain ordered to assess for structural causes of numbness and memory loss. Will refer for neuropsychological testing to better characterize her deficits and to establish a cognitive  baseline. Referral placed for cognitive rehab.   1. Memory loss       PLAN: - MRI brain  - Neuropsychological testing - Referral to Cognitive rehab - Take magnesium 400 mg daily for headaches - Follow up around 6 months or sooner if symptoms worsen  Orders Placed This Encounter  Procedures   MR BRAIN W WO CONTRAST   Ambulatory referral to Neuropsychology   Ambulatory referral to Speech Therapy     Return in about 6 months (around 05/10/2022).  I spent an average of 21 minutes chart reviewing and counseling the patient, with at least 50% of the time face to face with the patient. General brain health measures discussed, including the importance of regular aerobic exercise. Reviewed safety measures including driving safety.   Ocie Doyne, MD 11/10/21 10:59 AM  [facility] [address], [address] [place], [place] [address] [phone]

## 2021-11-10 NOTE — Patient Instructions (Signed)
You can take magnesium 400-500 mg daily to reduce headaches MRI brain Neuropsychological tasting Cognitive rehab

## 2021-11-22 ENCOUNTER — Encounter: Payer: Self-pay | Admitting: Psychology

## 2021-12-01 ENCOUNTER — Ambulatory Visit
Admission: RE | Admit: 2021-12-01 | Discharge: 2021-12-01 | Disposition: A | Discharge status: Home / Self Care | Source: Ambulatory Visit | Attending: Psychiatry | Admitting: Psychiatry

## 2021-12-01 DIAGNOSIS — R413 Other amnesia: Secondary | ICD-10-CM

## 2021-12-01 MED ORDER — GADOBENATE DIMEGLUMINE 529 MG/ML IV SOLN
14.0000 mL | Freq: Once | INTRAVENOUS | Status: AC | PRN
  Administered 2021-12-01: 14 mL via INTRAVENOUS

## 2021-12-28 ENCOUNTER — Ambulatory Visit: Attending: Psychiatry

## 2022-02-01 ENCOUNTER — Ambulatory Visit: Admitting: Psychology

## 2022-05-11 ENCOUNTER — Ambulatory Visit: Admitting: Psychiatry

## 2022-05-25 ENCOUNTER — Other Ambulatory Visit: Payer: Self-pay | Admitting: Family Medicine

## 2022-05-25 DIAGNOSIS — R109 Unspecified abdominal pain: Secondary | ICD-10-CM

## 2022-05-31 ENCOUNTER — Other Ambulatory Visit: Payer: Self-pay | Admitting: Family Medicine

## 2022-05-31 ENCOUNTER — Ambulatory Visit
Admission: RE | Admit: 2022-05-31 | Discharge: 2022-05-31 | Disposition: A | Discharge status: Home / Self Care | Source: Ambulatory Visit | Attending: Family Medicine | Admitting: Family Medicine

## 2022-05-31 DIAGNOSIS — R109 Unspecified abdominal pain: Secondary | ICD-10-CM

## 2022-06-02 ENCOUNTER — Other Ambulatory Visit: Payer: Self-pay | Admitting: Family Medicine

## 2022-06-02 DIAGNOSIS — R109 Unspecified abdominal pain: Secondary | ICD-10-CM

## 2022-06-19 ENCOUNTER — Ambulatory Visit
Admission: RE | Admit: 2022-06-19 | Discharge: 2022-06-19 | Disposition: A | Discharge status: Home / Self Care | Source: Ambulatory Visit | Attending: Family Medicine | Admitting: Family Medicine

## 2022-06-19 DIAGNOSIS — R109 Unspecified abdominal pain: Secondary | ICD-10-CM

## 2022-12-21 ENCOUNTER — Other Ambulatory Visit: Payer: Self-pay | Admitting: Family Medicine

## 2022-12-21 DIAGNOSIS — N6310 Unspecified lump in the right breast, unspecified quadrant: Secondary | ICD-10-CM

## 2023-02-27 ENCOUNTER — Inpatient Hospital Stay: Admission: RE | Admit: 2023-02-27 | Source: Ambulatory Visit

## 2023-06-22 IMAGING — CT CT ABD-PELV W/ CM
1 of 2 series · 14 of 32 positions shown, 19 images · IV contrast (APPLIED)
Comparison: CT abdomen pelvis, 01/08/2021 CT chest angiogram,
10/30/2020

CLINICAL DATA: Abdominal pain, nausea, vomiting, back pain
unintentional weight loss

EXAM:
CT ABDOMEN AND PELVIS WITH CONTRAST
TECHNIQUE: Multidetector CT imaging of the abdomen and pelvis was performed
using the standard protocol following bolus administration of
intravenous contrast.
CONTRAST:  80mL IVFXN4-NUO IOPAMIDOL (IVFXN4-NUO) INJECTION 76%

[Series 2: abd/pelvis w/cm · axial · 0.65mm/px · z∈[-510,-105]mm · 14 of 91 slices shown, 19 images]
[im 5/91  soft-tissue]
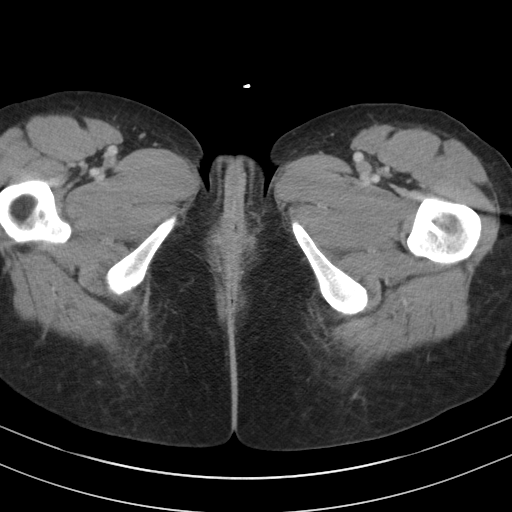
[im 5/91  bone]
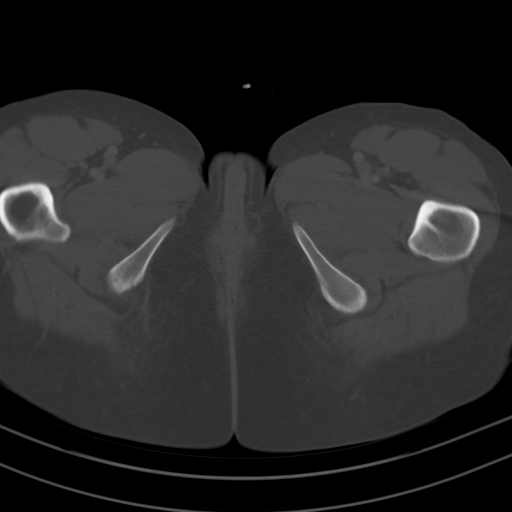
[im 14/91  soft-tissue]
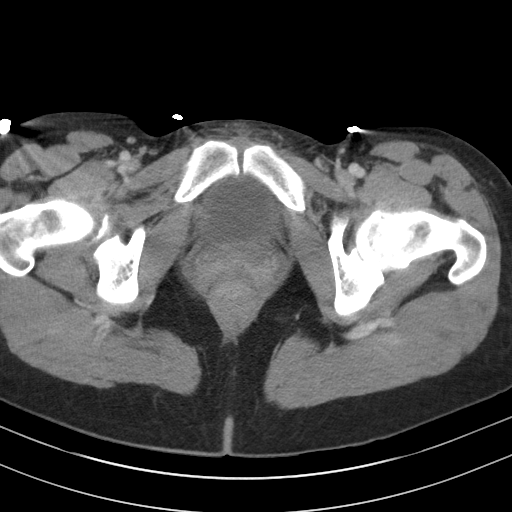
[im 19/91  soft-tissue]
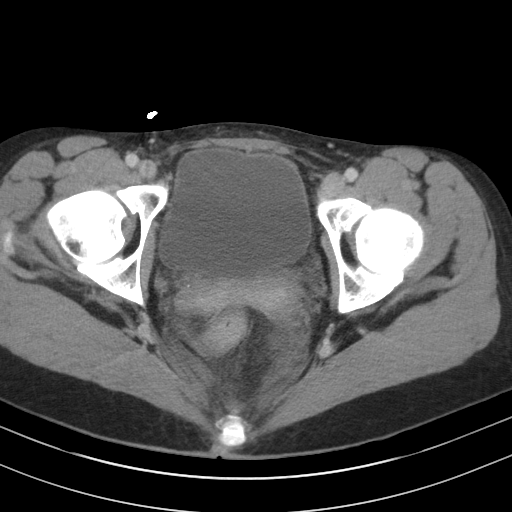
[im 28/91  soft-tissue]
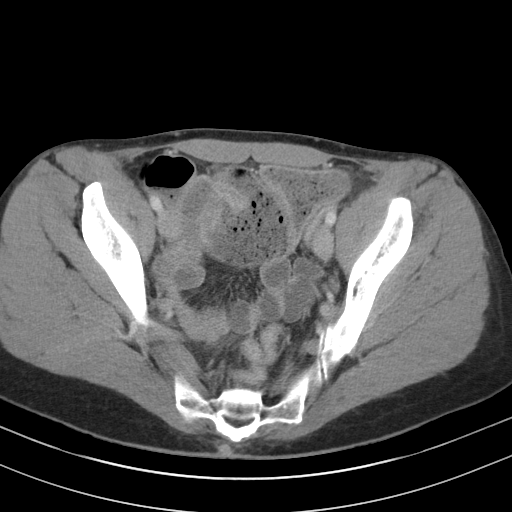
[im 32/91  soft-tissue]
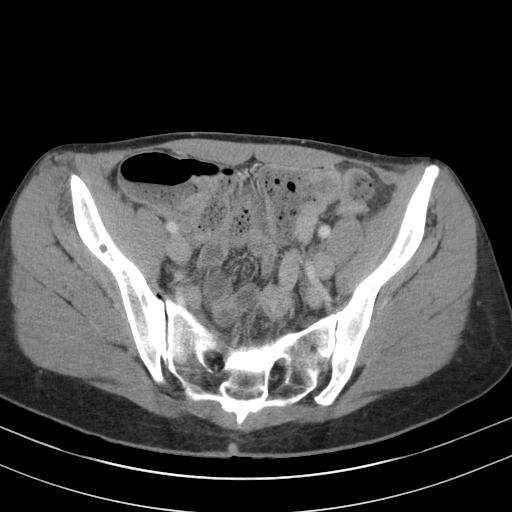
[im 41/91  soft-tissue]
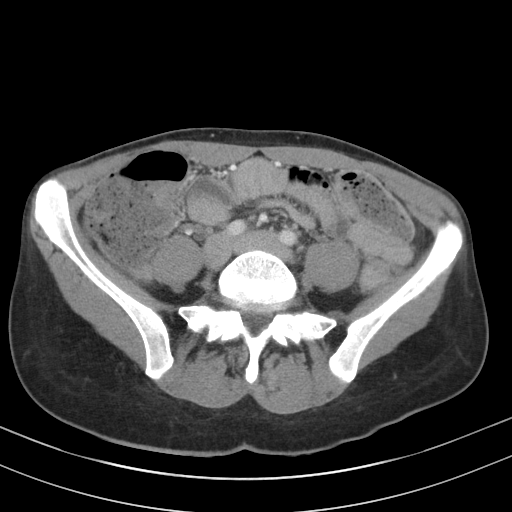
[im 46/91  soft-tissue]
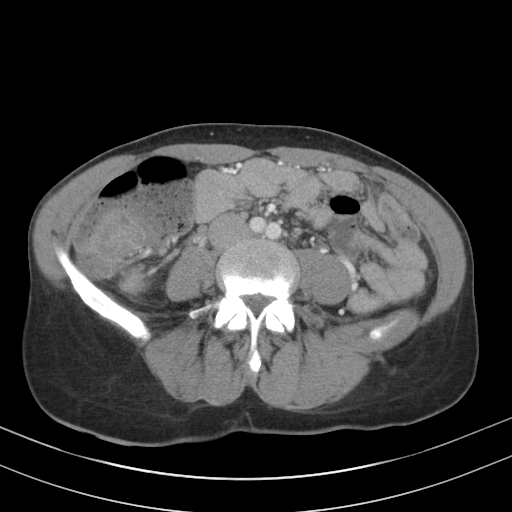
[im 50/91  soft-tissue]
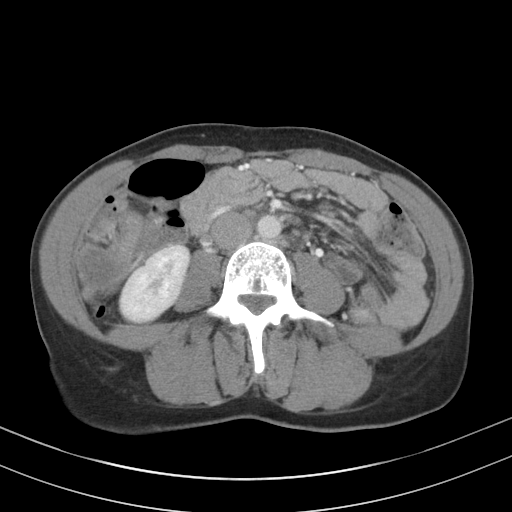
[im 59/91  soft-tissue]
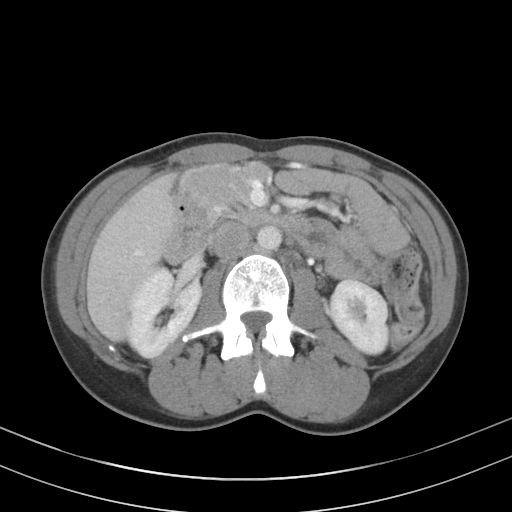
[im 59/91  bone]
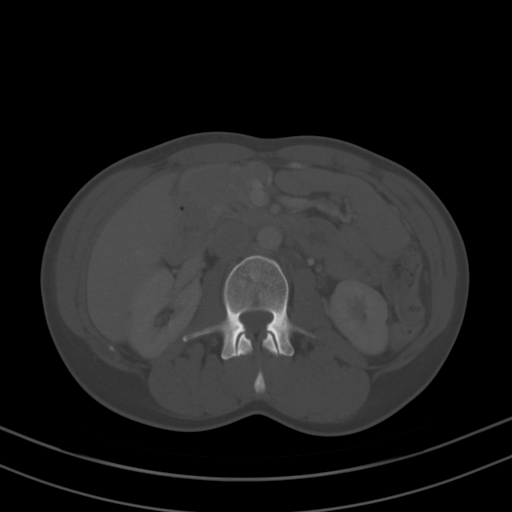
[im 64/91  soft-tissue]
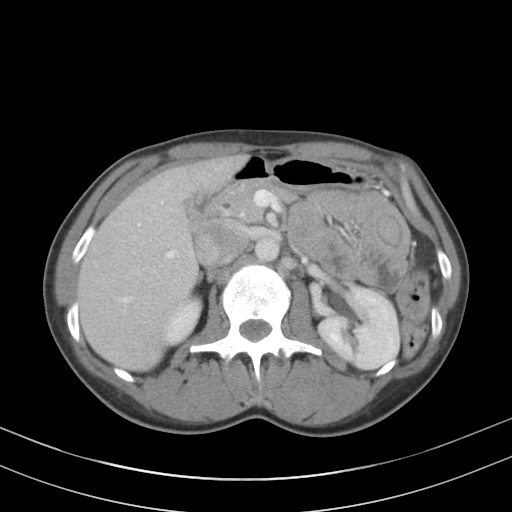
[im 73/91  soft-tissue]
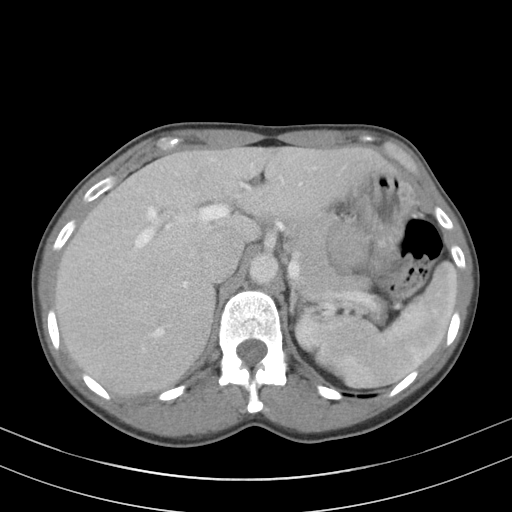
[im 73/91  lung]
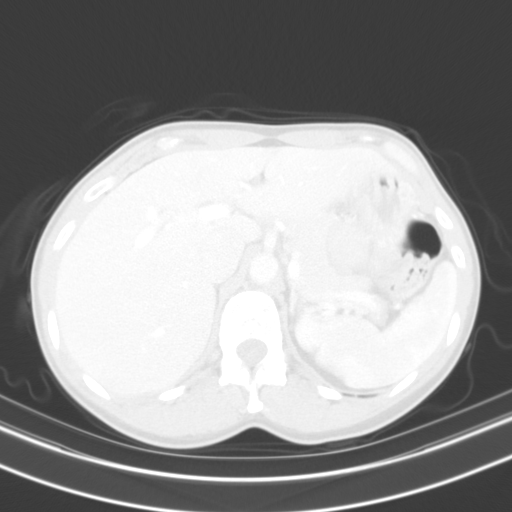
[im 77/91  soft-tissue]
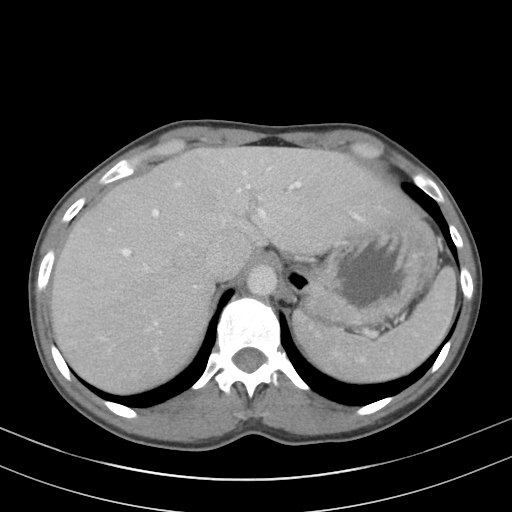
[im 77/91  lung]
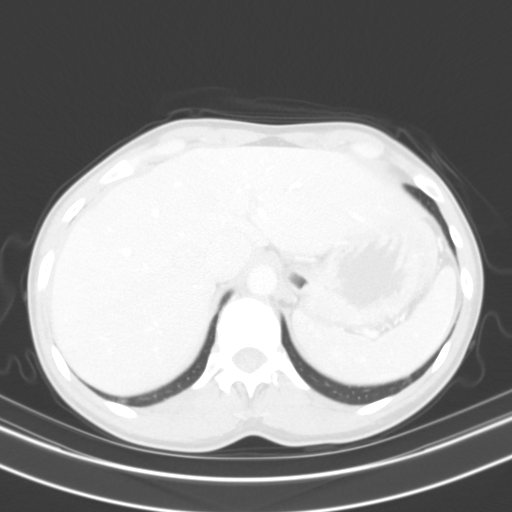
[im 82/91  lung]
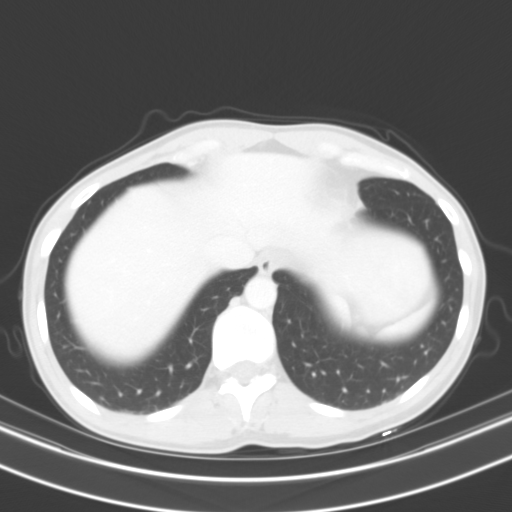
[im 86/91  soft-tissue]
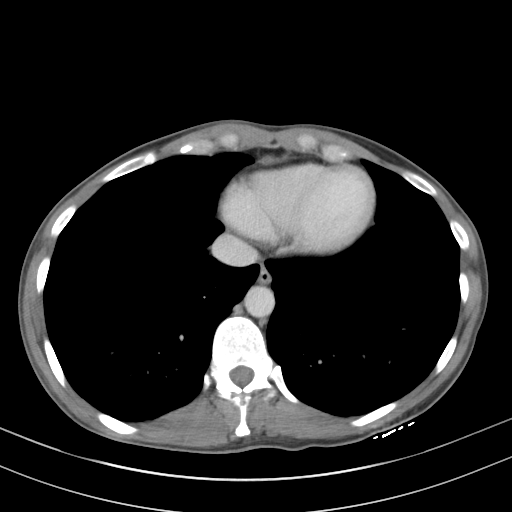
[im 86/91  lung]
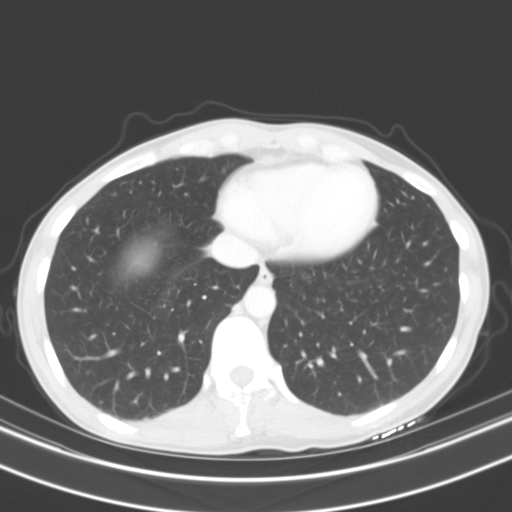

[14 of 32 positions shown; findings below may reference images not displayed]

FINDINGS: Lower chest: No acute abnormality. Stable, benign 4 mm subpleural
nodule of the peripheral right lower lobe (series 5, image 7).

Hepatobiliary: No focal liver abnormality is seen. Status post
cholecystectomy. No biliary dilatation.

Pancreas: Unremarkable. No pancreatic ductal dilatation or
surrounding inflammatory changes.

Spleen: Normal in size without significant abnormality.

Adrenals/Urinary Tract: Adrenal glands are unremarkable. Kidneys are
normal, without renal calculi, solid lesion, or hydronephrosis.
Bladder is unremarkable.

Stomach/Bowel: Stomach is within normal limits. Appendix appears
normal. No evidence of bowel wall thickening, distention, or
inflammatory changes. Large burden of stool throughout the colon.

Vascular/Lymphatic: No significant vascular findings are present. No
enlarged abdominal or pelvic lymph nodes.

Reproductive: Status post hysterectomy.

Other: No abdominal wall hernia or abnormality. No abdominopelvic
ascites.

Musculoskeletal: No acute or significant osseous findings.
IMPRESSION: 1. No acute CT findings of the abdomen or pelvis to explain
abdominal pain, nausea, vomiting, or weight loss.
2. Large burden of stool throughout the colon.
3. Status post cholecystectomy and hysterectomy.

## 2023-08-14 ENCOUNTER — Ambulatory Visit: Admitting: Psychiatry

## 2023-08-14 ENCOUNTER — Encounter: Payer: Self-pay | Admitting: Psychiatry

## 2023-08-14 VITALS — BP 116/77 | HR 78 | Ht 69.0 in | Wt 143.5 lb

## 2023-08-14 DIAGNOSIS — R2689 Other abnormalities of gait and mobility: Secondary | ICD-10-CM

## 2023-08-14 DIAGNOSIS — R55 Syncope and collapse: Secondary | ICD-10-CM
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
## 2023-08-21 ENCOUNTER — Ambulatory Visit: Admitting: Neurology

## 2023-08-21 DIAGNOSIS — R55 Syncope and collapse: Secondary | ICD-10-CM

## 2023-08-22 ENCOUNTER — Ambulatory Visit

## 2023-08-22 NOTE — Procedures (Signed)
    History:  42 year old woman with syncope   EEG classification: Awake and drowsy  Duration: 26 minutes   Technical aspects: This EEG study was done with scalp electrodes positioned according to the 10-20 International system of electrode placement. Electrical activity was reviewed with band pass filter of 1-70Hz , sensitivity of 7 uV/mm, display speed of 84mm/sec with a 60Hz  notched filter applied as appropriate. EEG data were recorded continuously and digitally stored.   Description of the recording: The background rhythms of this recording consists of a fairly well modulated medium amplitude alpha rhythm of 10 Hz that is reactive to eye opening and closure. Present in the anterior head region is a 15-20 Hz beta activity. Photic stimulation was performed, did not show any abnormalities. Hyperventilation was also performed, did not show any abnormalities. Drowsiness was manifested by background fragmentation. No abnormal epileptiform discharges seen during this recording. There was no focal slowing. There were no electrographic seizure identified.   Abnormality: None   Impression: This is a normal EEG recorded while drowsy and awake. No evidence of interictal epileptiform discharges. Normal EEGs, however, do not rule out epilepsy.    Windell Norfolk, MD [facility]

## 2023-08-28 ENCOUNTER — Ambulatory Visit (INDEPENDENT_AMBULATORY_CARE_PROVIDER_SITE_OTHER)

## 2023-08-28 DIAGNOSIS — R55 Syncope and collapse: Secondary | ICD-10-CM | POA: Diagnosis not present

## 2023-08-28 DIAGNOSIS — R2689 Other abnormalities of gait and mobility: Secondary | ICD-10-CM | POA: Diagnosis not present

## 2023-08-28 MED ORDER — GADOBENATE DIMEGLUMINE 529 MG/ML IV SOLN
13.0000 mL | Freq: Once | INTRAVENOUS | Status: AC | PRN
  Administered 2023-08-28: 13 mL via INTRAVENOUS

## 2023-09-16 NOTE — Therapy (Unsigned)
OUTPATIENT PHYSICAL THERAPY FEMALE PELVIC EVALUATION   Patient Name: Meghan Barry MRN: [id number] DOB:02/17/1981, 42 y.o., female Today's Date: 09/19/2023  END OF SESSION:    Past Medical History:  Diagnosis Date   Anxiety    Bilateral ovarian cysts    Depression    Insomnia    Stomach ulcer    Venous insufficiency    Venous insufficiency    ablations l leg 2009.,2011   Past Surgical History:  Procedure Laterality Date   ABDOMINAL HYSTERECTOMY  08/2017   anterior posterior vaginal wall repair     BIOPSY  08/01/2018   Procedure: BIOPSY;  Surgeon: Meghan Bologna, MD;  Location: [facility] ENDOSCOPY;  Service: Endoscopy;;   bladder tact  2018   BREAST CYST ASPIRATION Right    CHOLECYSTECTOMY N/A 10/03/2018   Procedure: DIAGNOSTIC LAPAROSCOPY, LAPAROSCOPIC CHOLECYSTECTOMY;  Surgeon: Meghan Adu, MD;  Location: [facility] ORS;  Service: General;  Laterality: N/A;   DILATION AND CURETTAGE OF UTERUS     ESOPHAGOGASTRODUODENOSCOPY N/A 08/01/2018   Procedure: ESOPHAGOGASTRODUODENOSCOPY (EGD);  Surgeon: Meghan Bologna, MD;  Location: [facility] ENDOSCOPY;  Service: Endoscopy;  Laterality: N/A;   left ovary removal  2011   OVARIAN CYST SURGERY     TONSILLECTOMY  1988   WRIST SURGERY  11/2015   bone graft   Patient Active Problem List   Diagnosis Date Noted   Cholecystitis 10/02/2018   Stomach ulcer    Gastritis 08/01/2018   Abdominal pain 07/30/2018   Anxiety 07/30/2018   Depression 07/30/2018   Peptic ulcer 07/30/2018    PCP: Meghan Screws, MD    REFERRING PROVIDER: Henrine Screws, MD   REFERRING DIAG:  G89.29 (ICD-10-CM) - Other chronic pain  R10.2 (ICD-10-CM) - Pelvic and perineal pain    THERAPY DIAG:  Generalized abdominal pain  Pelvic pain  Rectocele  Rationale for Evaluation and Treatment: Rehabilitation  ONSET DATE: 2017  SUBJECTIVE:    Pt was diagnosed with spastic pelvic floor. Complains of incomplete bowel emptying, she has also had pelvic pain with  intercourse, prolapse repairs and abdominal adhesions from many surgeries. She report sthat she had 8 surgeries in 15 years. Going to be tested for Endo, being seen at [facility], GI and feels like she has a lot of adhesion throughout her abdomen. She has a hx of 4 abortions and 2 vaginal deliveries.                                                                                                                                                                                          SUBJECTIVE STATEMENT:  Fluid intake: Yes:      PAIN:  Are  you having pain? Yes NPRS scale: 8-9/10 Pain location: Internal, External, Deep, Bilateral, Anal, Vaginal, Anterior, and Posterior  Pain type: burning and sharp with INTERCOURSE and in general, after intercourse Pain description: intermittent, sharp, burning, dull, stabbing, and aching   Aggravating factors: everything Relieving factors: rest  PRECAUTIONS: None  RED FLAGS: None   WEIGHT BEARING RESTRICTIONS: No  FALLS:  Has patient fallen in last 6 months? No  LIVING ENVIRONMENT: Lives with: lives with their family Lives in: House/apartment Stairs: yes Has following equipment at home: None  OCCUPATION: stay at home mom  PLOF: Independent  PATIENT GOALS: pain reduction  PERTINENT HISTORY:  See above Sexual abuse: No  BOWEL MOVEMENT: Pain with bowel movement: Yes Type of bowel movement:Strain Yes and Splinting uses femmeze or pelvic wand Fully empty rectum: No Leakage: No Pads: Yes:   Fiber supplement: No  URINATION: Pain with urination: No Fully empty bladder: No Stream: Strong Urgency: No Frequency: no Leakage:  no Pads: No  INTERCOURSE: Pain with intercourse: Initial Penetration, During Penetration, After Intercourse, and Pain Interrupts Intercourse Ability to have vaginal penetration:  Yes:   Climax: not assessed Meghan Barry Scale: 2/3  PREGNANCY: Vaginal deliveries 2 Tearing Yes:   C-section deliveries  Currently  pregnant No  PROLAPSE: Cystocele   and Rectocele     OBJECTIVE:   DIAGNOSTIC FINDINGS:  Tight and tender scars throughout abdomen.  COGNITION: Overall cognitive status: Within functional limits for tasks assessed     SENSATION: Light touch: Appears intact Proprioception: Appears intact   POSTURE: No Significant postural limitations and anterior pelvic tilt   LUMBARAROM/PROM:    General  WFL                External Perineal Exam tenderness to touch throughout                             Internal Pelvic Floor tenderness to touch throughout  Patient confirms identification and approves PT to assess internal pelvic floor and treatment Yes  PELVIC MMT:   MMT eval  Vaginal   Internal Anal Sphincter   External Anal Sphincter   Puborectalis   Diastasis Recti   (Blank rows = not tested)        TONE: Check next visit  PROLAPSE: Assess next  TODAY'S TREATMENT:                                                                                                                              DATE: 09/19/23   EVAL see above   PATIENT EDUCATION:  Education details: discussed estogen cream, patch? Pt to ask he OBGYN, pessary? Person educated: Patient Education method: Explanation, Demonstration, and Verbal cues Education comprehension: verbalized understanding, returned demonstration, and needs further education  HOME EXERCISE PROGRAM: Added cupping to abdominal restricted scars Manual therapy: Trigger Point Dry-Needling  Treatment instructions: Expect mild to moderate muscle soreness. S/S of pneumothorax  if dry needled over a lung field, and to seek immediate medical attention should they occur. Patient verbalized understanding of these instructions and education.  Patient Consent Given: Yes Education handout provided: Yes Muscles treated: bilateral rectus abdominis Electrical stimulation performed: No Parameters: N/A Treatment response/outcome: release of abdominal  trigger points   ASSESSMENT:  CLINICAL IMPRESSION: Patient is a 42 y.o. female who was seen today for physical therapy evaluation and treatment for pelvic and abdominal pain. Shuronda has had many abdominal surgeries in the past and presents with restricted abdominal scars. Did well today with dry needling education and stretches, Internal next. Juline will benefit from PT to reduce pain and have improved defecation and intercourse  tolerance.   OBJECTIVE IMPAIRMENTS: increased fascial restrictions and pain.   ACTIVITY LIMITATIONS: standing, sleeping, and caring for others  PARTICIPATION LIMITATIONS: cleaning, laundry, and interpersonal relationship  PERSONAL FACTORS: Behavior pattern, Past/current experiences, and Time since onset of injury/illness/exacerbation are also affecting patient's functional outcome.   REHAB POTENTIAL: Good  CLINICAL DECISION MAKING: Stable/uncomplicated  EVALUATION COMPLEXITY: Low   GOALS: Goals reviewed with patient? Yes  SHORT TERM GOALS: Target date: 12/17/23  Pt will report her BMs are complete due to improved bowel habits and evacuation techniques.  Baseline: Goal status: INITIAL  2.  Pt will be independent with use of squatty potty, relaxed toileting mechanics, and improved bowel movement techniques in order to increase ease of bowel movements and complete evacuation.   Baseline:  Goal status: INITIAL  3.  Pt will be independent with HEP.   Baseline:  Goal status: INITIAL   LONG TERM GOALS: Target date: 3/23   Pt will be independent with advanced HEP.   Baseline:  Goal status: INITIAL  2.  Pt will report 0/10 pain with vaginal penetration in order to improve intimate relationship with partner.    Baseline:  Goal status: INITIAL  3.  Pt will report at least 75 % less abdominal bloating and discomfort due to improved muscle tone throughout the core  Baseline:  Goal status: INITIAL  PLAN:  PT FREQUENCY: 1x/week  PT DURATION: 6  months  PLANNED INTERVENTIONS: Therapeutic exercises, Therapeutic activity, Neuromuscular re-education, Balance training, Gait training, Patient/Family education, Self Care, Joint mobilization, Joint manipulation, Dry Needling, Electrical stimulation, Spinal manipulation, Spinal mobilization, scar mobilization, and Manual therapy  PLAN FOR NEXT SESSION: initiate HEP   Jitka Helmus, PT 09/19/23 3:14 PM

## 2023-09-17 ENCOUNTER — Other Ambulatory Visit: Payer: Self-pay

## 2023-09-17 ENCOUNTER — Encounter: Payer: Self-pay | Admitting: Physical Therapy

## 2023-09-17 ENCOUNTER — Ambulatory Visit: Attending: Nurse Practitioner | Admitting: Physical Therapy

## 2023-09-17 DIAGNOSIS — N816 Rectocele: Secondary | ICD-10-CM | POA: Insufficient documentation

## 2023-09-17 DIAGNOSIS — R102 Pelvic and perineal pain: Secondary | ICD-10-CM | POA: Diagnosis not present

## 2023-09-17 DIAGNOSIS — G8929 Other chronic pain: Secondary | ICD-10-CM | POA: Diagnosis present

## 2023-09-17 DIAGNOSIS — R1084 Generalized abdominal pain: Secondary | ICD-10-CM | POA: Diagnosis not present

## 2023-10-03 ENCOUNTER — Ambulatory Visit: Admitting: Psychiatry

## 2023-10-09 ENCOUNTER — Ambulatory Visit: Attending: Nurse Practitioner | Admitting: Physical Therapy

## 2023-10-09 DIAGNOSIS — R102 Pelvic and perineal pain: Secondary | ICD-10-CM | POA: Insufficient documentation

## 2023-10-09 DIAGNOSIS — R1084 Generalized abdominal pain: Secondary | ICD-10-CM | POA: Insufficient documentation

## 2023-10-09 DIAGNOSIS — N816 Rectocele: Secondary | ICD-10-CM | POA: Insufficient documentation

## 2023-10-09 NOTE — Therapy (Signed)
OUTPATIENT PHYSICAL THERAPY FEMALE PELVIC EVALUATION   Patient Name: Meghan Barry MRN: [id number] DOB:[birth year], 42 y.o., female Today's Date: 10/09/2023  END OF SESSION:  PT End of Session - 10/09/23 0839     PT Start Time 0800    PT Stop Time 0840    PT Time Calculation (min) 40 min    Activity Tolerance Patient tolerated treatment well    Behavior During Therapy [facility] for tasks assessed/performed              Past Medical History:  Diagnosis Date   Anxiety    Bilateral ovarian cysts    Depression    Insomnia    Stomach ulcer    Venous insufficiency    Venous insufficiency    ablations l leg 2009.,2011   Past Surgical History:  Procedure Laterality Date   ABDOMINAL HYSTERECTOMY  08/2017   anterior posterior vaginal wall repair     BIOPSY  08/01/2018   Procedure: BIOPSY;  Surgeon: Lynann Bologna, MD;  Location: [facility] ENDOSCOPY;  Service: Endoscopy;;   bladder tact  2018   BREAST CYST ASPIRATION Right    CHOLECYSTECTOMY N/A 10/03/2018   Procedure: DIAGNOSTIC LAPAROSCOPY, LAPAROSCOPIC CHOLECYSTECTOMY;  Surgeon: Gaynelle Adu, MD;  Location: [facility] ORS;  Service: General;  Laterality: N/A;   DILATION AND CURETTAGE OF UTERUS     ESOPHAGOGASTRODUODENOSCOPY N/A 08/01/2018   Procedure: ESOPHAGOGASTRODUODENOSCOPY (EGD);  Surgeon: Lynann Bologna, MD;  Location: [facility] ENDOSCOPY;  Service: Endoscopy;  Laterality: N/A;   left ovary removal  2011   OVARIAN CYST SURGERY     TONSILLECTOMY  1988   WRIST SURGERY  11/2015   bone graft   Patient Active Problem List   Diagnosis Date Noted   Cholecystitis 10/02/2018   Stomach ulcer    Gastritis 08/01/2018   Abdominal pain 07/30/2018   Anxiety 07/30/2018   Depression 07/30/2018   Peptic ulcer 07/30/2018    PCP: Henrine Screws, MD    REFERRING PROVIDER: Henrine Screws, MD   REFERRING DIAG:  G89.29 (ICD-10-CM) - Other chronic pain  R10.2 (ICD-10-CM) - Pelvic and perineal pain    THERAPY DIAG:  Generalized abdominal  pain  Pelvic pain  Rectocele  Rationale for Evaluation and Treatment: Rehabilitation  ONSET DATE: 2017  SUBJECTIVE:    Pt reports that she is doing well, she had an appt at [facility] that mark on her leg is a venous malformation. Getting and MRI in November, will check for adhesions and pelvic congestion syndrome. Pt reports that dr felt a lot of scar tissue throughout her abdomen. Pt reports that dr finally got it. Suspecting of pelvic congestion syndrome. She will have injections for  pelvic   Pt was diagnosed with spastic pelvic floor. Complains of incomplete bowel emptying, she has also had pelvic pain with intercourse, prolapse repairs and abdominal adhesions from many surgeries. She report sthat she had 8 surgeries in 15 years. Going to be tested for Endo, being seen at [facility], GI and feels like she has a lot of adhesion throughout her abdomen. She has a hx of 4 abortions and 2 vaginal deliveries.  SUBJECTIVE STATEMENT:  Fluid intake: Yes:      PAIN:  Are you having pain? Yes NPRS scale: 8-9/10 Pain location: Internal, External, Deep, Bilateral, Anal, Vaginal, Anterior, and Posterior  Pain type: burning and sharp with INTERCOURSE and in general, after intercourse Pain description: intermittent, sharp, burning, dull, stabbing, and aching   Aggravating factors: everything Relieving factors: rest  PRECAUTIONS: None  RED FLAGS: None   WEIGHT BEARING RESTRICTIONS: No  FALLS:  Has patient fallen in last 6 months? No  LIVING ENVIRONMENT: Lives with: lives with their family Lives in: House/apartment Stairs: yes Has following equipment at home: None  OCCUPATION: stay at home mom  PLOF: Independent  PATIENT GOALS: pain reduction  PERTINENT HISTORY:  See above Sexual abuse: No  BOWEL  MOVEMENT: Pain with bowel movement: Yes Type of bowel movement:Strain Yes and Splinting uses femmeze or pelvic wand Fully empty rectum: No Leakage: No Pads: Yes:   Fiber supplement: No  URINATION: Pain with urination: No Fully empty bladder: No Stream: Strong Urgency: No Frequency: no Leakage:  no Pads: No  INTERCOURSE: Pain with intercourse: Initial Penetration, During Penetration, After Intercourse, and Pain Interrupts Intercourse Ability to have vaginal penetration:  Yes:   Climax: not assessed [name] Scale: 2/3  PREGNANCY: Vaginal deliveries 2 Tearing Yes:   C-section deliveries  Currently pregnant No  PROLAPSE: Cystocele   and Rectocele     OBJECTIVE:   DIAGNOSTIC FINDINGS:  Tight and tender scars throughout abdomen.  COGNITION: Overall cognitive status: Within functional limits for tasks assessed     SENSATION: Light touch: Appears intact Proprioception: Appears intact   POSTURE: No Significant postural limitations and anterior pelvic tilt   LUMBARAROM/PROM:    General  WFL                External Perineal Exam tenderness to touch throughout                             Internal Pelvic Floor tenderness to touch throughout  Patient confirms identification and approves PT to assess internal pelvic floor and treatment Yes  PELVIC MMT:   MMT eval  Vaginal   Internal Anal Sphincter   External Anal Sphincter   Puborectalis   Diastasis Recti   (Blank rows = not tested)        TONE: Check next visit  PROLAPSE: Assess next  TODAY'S TREATMENT:                                                                                                                              DATE: 10/09/23   Manual:  Neuromuscular re-education: ball press with TRANSVERSE ABDOMINIS BREATH   Exercises:  Therapeutic activities: review of HEP and progress- pt to look Hypopressives by Avelino Leeds     PATIENT EDUCATION:  Education details: discussed estogen  cream, patch? Pt to ask he OBGYN, pessary? Person educated: Patient Education method:  Explanation, Demonstration, and Verbal cues Education comprehension: verbalized understanding, returned demonstration, and needs further education  HOME EXERCISE PROGRAM: Added cupping to abdominal restricted scars Manual therapy: Trigger Point Dry-Needling  Treatment instructions: Expect mild to moderate muscle soreness. S/S of pneumothorax if dry needled over a lung field, and to seek immediate medical attention should they occur. Patient verbalized understanding of these instructions and education.  Patient Consent Given: Yes Education handout provided: Yes Muscles treated: bilateral rectus abdominis Electrical stimulation performed: No Parameters: N/A Treatment response/outcome: release of abdominal trigger points   ASSESSMENT:  CLINICAL IMPRESSION: Pt did well with her exercises and education today, Discussed adding hypopressives to her HEP. She is looking into adding a pessary as well.     Patient is a 42 y.o. female who was seen today for physical therapy evaluation and treatment for pelvic and abdominal pain. Shanequia has had many abdominal surgeries in the past and presents with restricted abdominal scars. Did well today with dry needling education and stretches, Internal next. Vikkie will benefit from PT to reduce pain and have improved defecation and intercourse  tolerance.   OBJECTIVE IMPAIRMENTS: increased fascial restrictions and pain.   ACTIVITY LIMITATIONS: standing, sleeping, and caring for others  PARTICIPATION LIMITATIONS: cleaning, laundry, and interpersonal relationship  PERSONAL FACTORS: Behavior pattern, Past/current experiences, and Time since onset of injury/illness/exacerbation are also affecting patient's functional outcome.   REHAB POTENTIAL: Good  CLINICAL DECISION MAKING: Stable/uncomplicated  EVALUATION COMPLEXITY: Low   GOALS: Goals reviewed with patient?  Yes  SHORT TERM GOALS: Target date: 12/17/23  Pt will report her BMs are complete due to improved bowel habits and evacuation techniques.  Baseline: Goal status: INITIAL  2.  Pt will be independent with use of squatty potty, relaxed toileting mechanics, and improved bowel movement techniques in order to increase ease of bowel movements and complete evacuation.   Baseline:  Goal status: INITIAL  3.  Pt will be independent with HEP.   Baseline:  Goal status: INITIAL   LONG TERM GOALS: Target date: 3/23   Pt will be independent with advanced HEP.   Baseline:  Goal status: INITIAL  2.  Pt will report 0/10 pain with vaginal penetration in order to improve intimate relationship with partner.    Baseline:  Goal status: INITIAL  3.  Pt will report at least 75 % less abdominal bloating and discomfort due to improved muscle tone throughout the core  Baseline:  Goal status: INITIAL  PLAN:  PT FREQUENCY: 1x/week  PT DURATION: 6 months  PLANNED INTERVENTIONS: Therapeutic exercises, Therapeutic activity, Neuromuscular re-education, Balance training, Gait training, Patient/Family education, Self Care, Joint mobilization, Joint manipulation, Dry Needling, Electrical stimulation, Spinal manipulation, Spinal mobilization, scar mobilization, and Manual therapy  PLAN FOR NEXT SESSION: initiate HEP   Kule Gascoigne, PT 10/09/23 8:41 AM

## 2023-10-16 ENCOUNTER — Encounter: Payer: Self-pay | Admitting: Rehabilitative and Restorative Service Providers"

## 2023-10-16 ENCOUNTER — Other Ambulatory Visit: Payer: Self-pay

## 2023-10-16 ENCOUNTER — Ambulatory Visit: Attending: Family Medicine | Admitting: Rehabilitative and Restorative Service Providers"

## 2023-10-16 DIAGNOSIS — M6281 Muscle weakness (generalized): Secondary | ICD-10-CM | POA: Diagnosis not present

## 2023-10-16 DIAGNOSIS — R252 Cramp and spasm: Secondary | ICD-10-CM

## 2023-10-16 DIAGNOSIS — M25511 Pain in right shoulder: Secondary | ICD-10-CM | POA: Insufficient documentation

## 2023-10-16 DIAGNOSIS — R293 Abnormal posture: Secondary | ICD-10-CM | POA: Insufficient documentation

## 2023-10-16 NOTE — Therapy (Signed)
OUTPATIENT PHYSICAL THERAPY SHOULDER EVALUATION   Patient Name: Meghan Barry MRN: [id number] DOB:[birth year], 42 y.o., female Today's Date: 10/16/2023  END OF SESSION:  PT End of Session - 10/16/23 1151     Visit Number 3    Date for PT Re-Evaluation 12/07/23    Authorization Type [employer]    PT Start Time 1146    PT Stop Time 1225    PT Time Calculation (min) 39 min    Activity Tolerance Patient tolerated treatment well    Behavior During Therapy [facility] for tasks assessed/performed             Past Medical History:  Diagnosis Date   Anxiety    Bilateral ovarian cysts    Depression    Insomnia    Stomach ulcer    Venous insufficiency    Venous insufficiency    ablations l leg 2009.,2011   Past Surgical History:  Procedure Laterality Date   ABDOMINAL HYSTERECTOMY  08/2017   anterior posterior vaginal wall repair     BIOPSY  08/01/2018   Procedure: BIOPSY;  Surgeon: Lynann Bologna, MD;  Location: [facility] ENDOSCOPY;  Service: Endoscopy;;   bladder tact  2018   BREAST CYST ASPIRATION Right    CHOLECYSTECTOMY N/A 10/03/2018   Procedure: DIAGNOSTIC LAPAROSCOPY, LAPAROSCOPIC CHOLECYSTECTOMY;  Surgeon: Gaynelle Adu, MD;  Location: [facility] ORS;  Service: General;  Laterality: N/A;   DILATION AND CURETTAGE OF UTERUS     ESOPHAGOGASTRODUODENOSCOPY N/A 08/01/2018   Procedure: ESOPHAGOGASTRODUODENOSCOPY (EGD);  Surgeon: Lynann Bologna, MD;  Location: [facility] ENDOSCOPY;  Service: Endoscopy;  Laterality: N/A;   left ovary removal  2011   OVARIAN CYST SURGERY     TONSILLECTOMY  1988   WRIST SURGERY  11/2015   bone graft   Patient Active Problem List   Diagnosis Date Noted   Cholecystitis 10/02/2018   Stomach ulcer    Gastritis 08/01/2018   Abdominal pain 07/30/2018   Anxiety 07/30/2018   Depression 07/30/2018   Peptic ulcer 07/30/2018    PCP: Henrine Screws, MD  REFERRING PROVIDER: Arlyce Harman, MD  REFERRING DIAG: M25.511 (ICD-10-CM) - Pain in right shoulder  THERAPY  DIAG:  Right shoulder pain, unspecified chronicity  Muscle weakness (generalized)  Cramp and spasm  Abnormal posture  Rationale for Evaluation and Treatment: Rehabilitation  ONSET DATE: Pt reports that 2 weeks ago, she started having increased right shoulder pain when she was doing plank.  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  Patient still enjoys going to the pool at least 3 days per week.  Patient states that she is being tested for EDS.  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  Currently in Pelvic Floor PT at this clinic.  PAIN:  Are you having pain? Yes: NPRS scale: 1-5/10 Pain location: right shoulder Pain description: burning, sharp Aggravating factors: certain movements Relieving factors: rest  PRECAUTIONS: None  RED FLAGS: None   WEIGHT BEARING RESTRICTIONS: No  FALLS:  Has patient fallen in last 6 months? 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  NEXT MD VISIT: Pending cortisone shot upon insurance approval  OBJECTIVE:  Note: Objective measures were completed at Evaluation unless otherwise noted.  DIAGNOSTIC FINDINGS:  Pt states that the right shoulder radiograph was  unremarkable.  Cervical MRI on 08/28/2023: IMPRESSION: MRI scan cervical spine without contrast showing mild disc degenerative change at C3-4 but without significant compression.  PATIENT SURVEYS:  Eval:  UEFS 44 / 80 = 55.0 %  COGNITION: Overall cognitive status: Within functional limits for tasks assessed     SENSATION: Pt reports at times, she gets tingling in her fingers  POSTURE: Rounded shoulders, forward flexed, scoliosis, scapular "winging"  UPPER EXTREMITY ROM:   Active ROM Right eval Left eval  Shoulder flexion 135 162  Shoulder extension    Shoulder abduction 135 165  Shoulder adduction    Shoulder internal rotation To mid thoracic To upper thoracic  Shoulder external rotation To upper thoracic To mid-thoracic  (Blank rows = not tested)  UPPER EXTREMITY MMT:  10/16/2023 (Eval): Right shoulder strength grossly 4 to 4+/5 throughout Left shoulder is [facility]  SHOULDER SPECIAL TESTS: Impingement tests: Hawkins/Kennedy impingement test: positive  Rotator cuff assessment: Empty can test: positive    JOINT MOBILITY TESTING:  Patient with some joint laxity noted    TODAY'S TREATMENT:                                                                                                                                         DATE: 10/16/2023 Issued HEP and provided patient with yellow and red tband Demonstrated HEP x10 of each   PATIENT EDUCATION: Education details: Issued HEP Person educated: Patient Education method: Explanation, Demonstration, and Handouts Education comprehension: verbalized understanding and returned demonstration  HOME EXERCISE PROGRAM: Access Code: [email or web address] URL: [email or web address]  Date: 10/16/2023 Prepared by: Reather Laurence  Exercises - Seated Scapular Retraction  - 1 x daily - 7 x weekly - 2 sets - 10 reps - 5 sec hold - Standing Shoulder Abduction Wall Slide with Thumb Out  - 1 x daily - 7 x weekly - 2 sets - 10 reps -  Shoulder Flexion Serratus Activation with Resistance  - 1 x daily - 7 x weekly - 2 sets - 10 reps - Wall Clock with Theraband  - 1 x daily - 7 x weekly - 2 sets - 5 reps - Shoulder External Rotation and Scapular Retraction with Resistance  - 1 x daily - 7 x weekly - 2 sets - 10 reps - Standing Shoulder Horizontal Abduction with Resistance  - 1 x daily - 7 x weekly - 2 sets - 10 reps  ASSESSMENT:  CLINICAL IMPRESSION: Patient is a 42 y.o. female who was seen today for physical therapy evaluation and treatment for right shoulder pain. Patient states that she has intermittently had shoulder popping for years, but 2 weeks ago, she noted her shoulder "popping out" with doing a plank and that the pain has stayed, so she went to the MD for assessment and was referred to PT.  Patient states that she should be getting a steroid injection once approved by insurance, but is hoping to start PT now to begin strengthening and joint stabilization exercises.  Patient presents with decreased right shoulder A/ROM, weakness, scapular instability with winging of scapula noted, and increased pain.  Patient would benefit from skilled PT to address her functional impairments to allow her to be return to her active lifestyle without increased pain.  OBJECTIVE IMPAIRMENTS: decreased ROM, decreased strength, increased muscle spasms, impaired UE functional use, postural dysfunction, and pain.   ACTIVITY LIMITATIONS: carrying, lifting, sleeping, and reach over head  PARTICIPATION LIMITATIONS: cleaning, laundry, community activity, occupation, and yard work  PERSONAL FACTORS: Past/current experiences, Time since onset of injury/illness/exacerbation, and 3+ comorbidities: scoliosis, being tested for EDS, abdominal hysterectomy  are also affecting patient's functional outcome.   REHAB POTENTIAL: Good  CLINICAL DECISION MAKING: Evolving/moderate complexity  EVALUATION COMPLEXITY: Moderate   GOALS: Goals reviewed with  patient? Yes  SHORT TERM GOALS: Target date: 11/02/2023  Patient will be independent with initial HEP. Baseline: Goal status: INITIAL  2.  Patient will demonstrate right shoulder A/ROM to [facility] to allow her to reach overhead without increased pain. Baseline:  Goal status: INITIAL   LONG TERM GOALS: Target date: 12/07/2023  Patient will be independent with advanced HEP. Baseline:  Goal status: INITIAL  2.  Patient will increase Upper Extremity Functional Scale to at least 65% to demonstrate improved functional status. Baseline: 55% Goal status: INITIAL  3.  Patient will increase right shoulder/scapular strength to [facility] to allow her to complete various desired tasks without joint "popping out" or increased pain. Baseline:  Goal status: INITIAL  4.  Patient to report at least 75% improvement to allow her to return to pain-free swimming and exercise. Baseline:  Goal status: INITIAL   PLAN:  PT FREQUENCY: 1-2x/week  PT DURATION: 8 weeks  PLANNED INTERVENTIONS: 97110-Therapeutic exercises, 97530- Therapeutic activity, [id number]- Neuromuscular re-education, 97535- Self Care, 29562- Manual therapy, [id number]- Aquatic Therapy, 97014- Electrical stimulation (unattended), [id number]- Electrical stimulation (manual), [id number]- Vasopneumatic device, [id number]- Ultrasound, [id number]- Traction (mechanical), Taping, Dry Needling, Joint mobilization, Joint manipulation, Cryotherapy, and Moist heat  PLAN FOR NEXT SESSION: Assess and progress HEP as indicated, strengthening, postural stability, scapular stabilization   Shevy Yaney  Joenathan Sakuma, PT, DPT 10/16/23, 11:53 AM  [facility] [address], [address] [place], [place] [address] Phone # [phone] Fax [phone]

## 2023-10-23 ENCOUNTER — Encounter: Payer: Self-pay | Admitting: Rehabilitative and Restorative Service Providers"

## 2023-10-23 ENCOUNTER — Ambulatory Visit: Admitting: Rehabilitative and Restorative Service Providers"

## 2023-10-23 DIAGNOSIS — R293 Abnormal posture: Secondary | ICD-10-CM

## 2023-10-23 DIAGNOSIS — M25511 Pain in right shoulder: Secondary | ICD-10-CM

## 2023-10-23 DIAGNOSIS — M6281 Muscle weakness (generalized): Secondary | ICD-10-CM

## 2023-10-23 DIAGNOSIS — R252 Cramp and spasm: Secondary | ICD-10-CM

## 2023-10-23 NOTE — Therapy (Signed)
OUTPATIENT PHYSICAL THERAPY SHOULDER TREATMENT NOTE   Patient Name: Meghan Barry MRN: [id number] DOB:01-22-1981, 42 y.o., female Today's Date: 10/23/2023  END OF SESSION:  PT End of Session - 10/23/23 0805     Visit Number 4   2 ortho visit   Date for PT Re-Evaluation 12/07/23    Authorization Type [employer]    PT Start Time 0802    PT Stop Time 0840    PT Time Calculation (min) 38 min    Activity Tolerance Patient tolerated treatment well    Behavior During Therapy [facility] for tasks assessed/performed             Past Medical History:  Diagnosis Date   Anxiety    Bilateral ovarian cysts    Depression    Insomnia    Stomach ulcer    Venous insufficiency    Venous insufficiency    ablations l leg 2009.,2011   Past Surgical History:  Procedure Laterality Date   ABDOMINAL HYSTERECTOMY  08/2017   anterior posterior vaginal wall repair     BIOPSY  08/01/2018   Procedure: BIOPSY;  Surgeon: Lynann Bologna, MD;  Location: [facility] ENDOSCOPY;  Service: Endoscopy;;   bladder tact  2018   BREAST CYST ASPIRATION Right    CHOLECYSTECTOMY N/A 10/03/2018   Procedure: DIAGNOSTIC LAPAROSCOPY, LAPAROSCOPIC CHOLECYSTECTOMY;  Surgeon: Gaynelle Adu, MD;  Location: [facility] ORS;  Service: General;  Laterality: N/A;   DILATION AND CURETTAGE OF UTERUS     ESOPHAGOGASTRODUODENOSCOPY N/A 08/01/2018   Procedure: ESOPHAGOGASTRODUODENOSCOPY (EGD);  Surgeon: Lynann Bologna, MD;  Location: [facility] ENDOSCOPY;  Service: Endoscopy;  Laterality: N/A;   left ovary removal  2011   OVARIAN CYST SURGERY     TONSILLECTOMY  1988   WRIST SURGERY  11/2015   bone graft   Patient Active Problem List   Diagnosis Date Noted   Cholecystitis 10/02/2018   Stomach ulcer    Gastritis 08/01/2018   Abdominal pain 07/30/2018   Anxiety 07/30/2018   Depression 07/30/2018   Peptic ulcer 07/30/2018    PCP: Henrine Screws, MD  REFERRING PROVIDER: Arlyce Harman, MD  REFERRING DIAG: M25.511 (ICD-10-CM) - Pain in right  shoulder  THERAPY DIAG:  Right shoulder pain, unspecified chronicity  Muscle weakness (generalized)  Cramp and spasm  Abnormal posture  Rationale for Evaluation and Treatment: Rehabilitation  ONSET DATE: Pt reports that 2 weeks ago, she started having increased right shoulder pain when she was doing plank.  SUBJECTIVE:  SUBJECTIVE STATEMENT: Pt reports that she goes for her shoulder injection later today.  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  Currently in Pelvic Floor PT at this clinic.  PAIN:  Are you having pain? Yes: NPRS scale: 3/10 Pain location: right shoulder Pain description: burning, sharp Aggravating factors: certain movements Relieving factors: rest  PRECAUTIONS: None  RED FLAGS: None   WEIGHT BEARING RESTRICTIONS: No  FALLS:  Has patient fallen in last 6 months? 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  NEXT MD VISIT: Pending cortisone shot upon insurance approval  OBJECTIVE:  Note: Objective measures were completed at Evaluation unless otherwise noted.  DIAGNOSTIC FINDINGS:  Pt states that the right shoulder radiograph was unremarkable.  Cervical MRI on 08/28/2023: IMPRESSION: MRI scan cervical spine without contrast showing mild disc degenerative  change at C3-4 but without significant compression.  PATIENT SURVEYS:  Eval:  UEFS 44 / 80 = 55.0 %  COGNITION: Overall cognitive status: Within functional limits for tasks assessed     SENSATION: Pt reports at times, she gets tingling in her fingers  POSTURE: Rounded shoulders, forward flexed, scoliosis, scapular "winging"  UPPER EXTREMITY ROM:   Active ROM Right eval Left eval  Shoulder flexion 135 162  Shoulder extension    Shoulder abduction 135 165  Shoulder adduction    Shoulder internal rotation To mid thoracic To upper thoracic  Shoulder external rotation To upper thoracic To mid-thoracic  (Blank rows = not tested)  UPPER EXTREMITY MMT:  10/16/2023 (Eval): Right shoulder strength grossly 4 to 4+/5 throughout Left shoulder is [facility]  SHOULDER SPECIAL TESTS: Impingement tests: Hawkins/Kennedy impingement test: positive  Rotator cuff assessment: Empty can test: positive    JOINT MOBILITY TESTING:  Patient with some joint laxity noted    TODAY'S TREATMENT:                                                                                                                                         DATE:  10/23/2023 UBE level 1.0 x3 min each direction with PT present to discuss status Standing 4D scapular stabilization with light blue 2# ball on wall  x20 each Standing performing 3 way wall clocks for scapular stabilization with blue loop 2x5 each Standing facing the wall lower trap lift-offs x10 (with cuing for improved technique and posture) Supine serratus punch with 2# 2x10 bilat (cuing to maintain proper technique and not "over-doing" it) Supine  letter tracing of alphabet with 2# dumbbell A-Z bilat Supine shoulder ER and horizontal abduction with red tband 2x10 each bilat Bear Planks 5x5 sec Quadruped Bird Dog 2x5   10/16/2023 Issued HEP and provided patient with yellow and red tband Demonstrated HEP x10 of each   PATIENT EDUCATION: Education details: Issued  HEP Person educated: Patient Education method: Explanation, Demonstration, and Handouts Education comprehension: verbalized understanding and returned demonstration  HOME EXERCISE PROGRAM: Access Code: [email or web address] URL: [email or web address] Date: 10/23/2023 Prepared by: Clydie Braun Taseen Marasigan  Exercises - Seated Scapular Retraction  - 1 x daily - 7 x weekly - 2 sets - 10 reps - 5 sec hold - Standing Shoulder Abduction Wall Slide with Thumb Out  - 1 x daily - 7 x weekly - 2 sets - 10 reps - Shoulder Flexion Serratus Activation with Resistance  - 1 x daily - 7 x weekly - 2 sets - 10 reps - Wall Clock with Theraband  - 1 x daily - 7 x weekly - 2 sets - 5 reps - Shoulder External Rotation and Scapular Retraction with Resistance  - 1 x daily - 7 x weekly - 2 sets - 10 reps - Standing Shoulder Horizontal Abduction with Resistance  - 1 x daily - 7 x weekly - 2 sets - 10 reps - Bear Plank from [employer]  - 1 x daily - 7 x weekly - 5 reps - 5 sec hold - Quadruped Pelvic Floor Contraction with Opposite Arm and Leg Lift  - 1 x daily - 7 x weekly - 2 sets - 5-10 reps  ASSESSMENT:  CLINICAL IMPRESSION: Bettylou presents to skilled PT reporting that she is feeling some better.  States that she does have some soreness after the exercise, but it subsides quickly.  Patient able to progress with exercises during clinic with minimal cuing for improved technique to decrease speed to allow for increased muscle activation and move within pain-free range.  Patient is progressing with increased scapular strengthening for increased joint stabilization.  Added in exercises in quadruped for increased joint approximation of shoulder girdle for improved strengthening.   OBJECTIVE IMPAIRMENTS: decreased ROM, decreased strength, increased muscle spasms, impaired UE functional use, postural dysfunction, and pain.   ACTIVITY LIMITATIONS: carrying, lifting, sleeping, and reach over head  PARTICIPATION LIMITATIONS:  cleaning, laundry, community activity, occupation, and yard work  PERSONAL FACTORS: Past/current experiences, Time since onset of injury/illness/exacerbation, and 3+ comorbidities: scoliosis, being tested for EDS, abdominal hysterectomy  are also affecting patient's functional outcome.   REHAB POTENTIAL: Good  CLINICAL DECISION MAKING: Evolving/moderate complexity  EVALUATION COMPLEXITY: Moderate   GOALS: Goals reviewed with patient? Yes  SHORT TERM GOALS: Target date: 11/02/2023  Patient will be independent with initial HEP. Baseline: Goal status: Met on 10/23/23  2.  Patient will demonstrate right shoulder A/ROM to [facility] to allow her to reach overhead without increased pain. Baseline:  Goal status: INITIAL   LONG TERM GOALS: Target date: 12/07/2023  Patient will be independent with advanced HEP. Baseline:  Goal status: INITIAL  2.  Patient will increase Upper Extremity Functional Scale to at least 65% to demonstrate improved functional status. Baseline: 55% Goal status: INITIAL  3.  Patient will increase right shoulder/scapular strength to [facility] to allow her to complete various desired tasks without joint "popping out" or increased pain. Baseline:  Goal status: INITIAL  4.  Patient to report at least 75% improvement to allow her to return to pain-free swimming and exercise. Baseline:  Goal status: INITIAL  PLAN:  PT FREQUENCY: 1-2x/week  PT DURATION: 8 weeks  PLANNED INTERVENTIONS: 97110-Therapeutic exercises, 97530- Therapeutic activity, [id number]- Neuromuscular re-education, 97535- Self Care, 54098- Manual therapy, [id number]- Aquatic Therapy, 97014- Electrical stimulation (unattended), [id number]- Electrical stimulation (manual), 97016- Vasopneumatic device, [id number]- Ultrasound, [id number]- Traction (mechanical), Taping, Dry Needling, Joint mobilization, Joint manipulation, Cryotherapy, and Moist heat  PLAN FOR NEXT SESSION: Assess and progress HEP as indicated, strengthening,  postural stability, scapular stabilization   Reather Laurence, PT, DPT 10/23/23, 8:45 AM  [facility] [address], [address] [place], [place] [address] Phone # [phone] Fax [phone]

## 2023-10-26 ENCOUNTER — Encounter: Admitting: Physical Therapy

## 2023-10-29 NOTE — Therapy (Addendum)
OUTPATIENT PHYSICAL THERAPY SHOULDER TREATMENT NOTE   Patient Name: Meghan Barry MRN: [id number] DOB:04-15-1981, 42 y.o., female Today's Date: 10/30/2023  END OF SESSION:  PT End of Session - 10/30/23 1152     Visit Number 5    Date for PT Re-Evaluation 12/07/23    Authorization Type [employer]    PT Start Time 1152    PT Stop Time 1230    PT Time Calculation (min) 38 min    Activity Tolerance Patient tolerated treatment well    Behavior During Therapy [facility] for tasks assessed/performed              Past Medical History:  Diagnosis Date   Anxiety    Bilateral ovarian cysts    Depression    Insomnia    Stomach ulcer    Venous insufficiency    Venous insufficiency    ablations l leg 2009.,2011   Past Surgical History:  Procedure Laterality Date   ABDOMINAL HYSTERECTOMY  08/2017   anterior posterior vaginal wall repair     BIOPSY  08/01/2018   Procedure: BIOPSY;  Surgeon: Lynann Bologna, MD;  Location: [facility] ENDOSCOPY;  Service: Endoscopy;;   bladder tact  2018   BREAST CYST ASPIRATION Right    CHOLECYSTECTOMY N/A 10/03/2018   Procedure: DIAGNOSTIC LAPAROSCOPY, LAPAROSCOPIC CHOLECYSTECTOMY;  Surgeon: Gaynelle Adu, MD;  Location: [facility] ORS;  Service: General;  Laterality: N/A;   DILATION AND CURETTAGE OF UTERUS     ESOPHAGOGASTRODUODENOSCOPY N/A 08/01/2018   Procedure: ESOPHAGOGASTRODUODENOSCOPY (EGD);  Surgeon: Lynann Bologna, MD;  Location: [facility] ENDOSCOPY;  Service: Endoscopy;  Laterality: N/A;   left ovary removal  2011   OVARIAN CYST SURGERY     TONSILLECTOMY  1988   WRIST SURGERY  11/2015   bone graft   Patient Active Problem List   Diagnosis Date Noted   Cholecystitis 10/02/2018   Stomach ulcer    Gastritis 08/01/2018   Abdominal pain 07/30/2018   Anxiety 07/30/2018   Depression 07/30/2018   Peptic ulcer 07/30/2018    PCP: Henrine Screws, MD  REFERRING PROVIDER: Arlyce Harman, MD  REFERRING DIAG: M25.511 (ICD-10-CM) - Pain in right  shoulder  THERAPY DIAG:  Right shoulder pain, unspecified chronicity  Muscle weakness (generalized)  Cramp and spasm  Abnormal posture  Rationale for Evaluation and Treatment: Rehabilitation  ONSET DATE: Pt reports that 2 weeks ago, she started having increased right shoulder pain when she was doing plank.  SUBJECTIVE:  SUBJECTIVE STATEMENT: Soreness. Had injection which has really helped. Not as inflamed.   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  Currently in Pelvic Floor PT at this clinic.  PAIN:  Are you having pain? Yes: NPRS scale: 3/10 Pain location: right shoulder Pain description: burning, sharp Aggravating factors: certain movements Relieving factors: rest  PRECAUTIONS: None  RED FLAGS: None   WEIGHT BEARING RESTRICTIONS: No  FALLS:  Has patient fallen in last 6 months? 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  NEXT MD VISIT: Pending cortisone shot upon insurance approval  OBJECTIVE:  Note: Objective measures were completed at Evaluation unless otherwise noted.  DIAGNOSTIC FINDINGS:  Pt states that the right shoulder radiograph was unremarkable.  Cervical MRI on 08/28/2023: IMPRESSION: MRI scan cervical spine without contrast showing mild disc degenerative  change at C3-4 but without significant compression.  PATIENT SURVEYS:  Eval:  UEFS 44 / 80 = 55.0 %  COGNITION: Overall cognitive status: Within functional limits for tasks assessed     SENSATION: Pt reports at times, she gets tingling in her fingers  POSTURE: Rounded shoulders, forward flexed, scoliosis, scapular "winging"  UPPER EXTREMITY ROM:   Active ROM Right eval Left eval  Shoulder flexion 135 162  Shoulder extension    Shoulder abduction 135 165  Shoulder adduction    Shoulder internal rotation To mid thoracic To upper thoracic  Shoulder external rotation To upper thoracic To mid-thoracic  (Blank rows = not tested)  UPPER EXTREMITY MMT:  10/16/2023 (Eval): Right shoulder strength grossly 4 to 4+/5 throughout Left shoulder is [facility]  SHOULDER SPECIAL TESTS: Impingement tests: Hawkins/Kennedy impingement test: positive  Rotator cuff assessment: Empty can test: positive    JOINT MOBILITY TESTING:  Patient with some joint laxity noted    TODAY'S TREATMENT:                                                                                                                                         DATE:  10/30/2023 Standing 4D scapular stabilization with light blue 2# ball on wall  x20 each Standing performing 3 way wall clocks for scapular stabilization with blue loop 2x5 each Standing facing the wall lower trap lift-offs + scapular depression 2 x10 (with cuing for improved technique and posture) Prone Y lift with lat pull down x 10 - cues to keep legs down and stabilize with abs Supine serratus punch with 3# 2x10 bilat (  cuing to maintain proper technique and not "over-doing" it) Supine letter tracing of alphabet with 3# dumbbell A-Z bilat Supine shoulder ER and horizontal abduction with red tband 1x10 each bilat, also did in sitting and standing with noodle along her spine to decrease thoracic protraction. Knee plank B shoulder slide OH to lat pull down. Push up to start  position x 15 Bow and arrow Red Tband x 10  10/23/2023 UBE level 1.0 x3 min each direction with PT present to discuss status Standing 4D scapular stabilization with light blue 2# ball on wall  x20 each Standing performing 3 way wall clocks for scapular stabilization with blue loop 2x5 each Standing facing the wall lower trap lift-offs x10 (with cuing for improved technique and posture) Supine serratus punch with 2# 2x10 bilat (cuing to maintain proper technique and not "over-doing" it) Supine letter tracing of alphabet with 2# dumbbell A-Z bilat Supine shoulder ER and horizontal abduction with red tband 2x10 each bilat Bear Planks 5x5 sec Quadruped Bird Dog 2x5   10/16/2023 Issued HEP and provided patient with yellow and red tband Demonstrated HEP x10 of each   PATIENT EDUCATION: Education details: Issued HEP Person educated: Patient Education method: Explanation, Demonstration, and Handouts Education comprehension: verbalized understanding and returned demonstration  HOME EXERCISE PROGRAM: Access Code: [email or web address] URL: [email or web address] Date: 10/30/2023 Prepared by: Raynelle Fanning  Exercises - Seated Scapular Retraction  - 1 x daily - 7 x weekly - 2 sets - 10 reps - 5 sec hold - Standing Shoulder Abduction Wall Slide with Thumb Out  - 1 x daily - 7 x weekly - 2 sets - 10 reps - Shoulder Flexion Serratus Activation with Resistance  - 1 x daily - 7 x weekly - 2 sets - 10 reps - Wall Clock with Theraband  - 1 x daily - 7 x weekly - 2 sets - 5 reps - Shoulder External Rotation and Scapular Retraction with Resistance  - 1 x daily - 7 x weekly - 2 sets - 10 reps - Standing Shoulder Horizontal Abduction with Resistance  - 1 x daily - 7 x weekly - 2 sets - 10 reps - Bear Plank from [employer]  - 1 x daily - 7 x weekly - 5 reps - 5 sec hold - Quadruped Pelvic Floor Contraction with Opposite Arm and Leg Lift  - 1 x daily - 7 x weekly - 2 sets - 5-10 reps - Drawing Bow  - 1 x  daily - 3-4 x weekly - 1-2 sets - 10 reps  ASSESSMENT:  CLINICAL IMPRESSION: Thresa reports some relief from injection last week. She continues to demonstrate serratus weakness with exercises. She fatigues fairly quickly with low traps exercises in both standing and prone. Haleigha will be gone next week, but then return the following week. She is independent with her HEP and will continue with it during this break. She continues to demonstrate potential for improvement and would benefit from continued skilled therapy to address impairments.     OBJECTIVE IMPAIRMENTS: decreased ROM, decreased strength, increased muscle spasms, impaired UE functional use, postural dysfunction, and pain.   ACTIVITY LIMITATIONS: carrying, lifting, sleeping, and reach over head  PARTICIPATION LIMITATIONS: cleaning, laundry, community activity, occupation, and yard work  PERSONAL FACTORS: Past/current experiences, Time since onset of injury/illness/exacerbation, and 3+ comorbidities: scoliosis, being tested for EDS, abdominal hysterectomy  are also affecting patient's functional outcome.   REHAB POTENTIAL: Good  CLINICAL DECISION MAKING: Evolving/moderate complexity  EVALUATION COMPLEXITY: Moderate   GOALS: Goals  reviewed with patient? Yes  SHORT TERM GOALS: Target date: 11/02/2023  Patient will be independent with initial HEP. Baseline: Goal status: Met on 10/23/23  2.  Patient will demonstrate right shoulder A/ROM to [facility] to allow her to reach overhead without increased pain. Baseline:  Goal status: MET   LONG TERM GOALS: Target date: 12/07/2023  Patient will be independent with advanced HEP. Baseline:  Goal status: INITIAL  2.  Patient will increase Upper Extremity Functional Scale to at least 65% to demonstrate improved functional status. Baseline: 55% Goal status: INITIAL  3.  Patient will increase right shoulder/scapular strength to [facility] to allow her to complete various desired tasks without  joint "popping out" or increased pain. Baseline:  Goal status: INITIAL  4.  Patient to report at least 75% improvement to allow her to return to pain-free swimming and exercise. Baseline:  Goal status: INITIAL   PLAN:  PT FREQUENCY: 1-2x/week  PT DURATION: 8 weeks  PLANNED INTERVENTIONS: 97110-Therapeutic exercises, 97530- Therapeutic activity, [id number]- Neuromuscular re-education, 97535- Self Care, 16109- Manual therapy, [id number]- Aquatic Therapy, 97014- Electrical stimulation (unattended), [id number]- Electrical stimulation (manual), 97016- Vasopneumatic device, [id number]- Ultrasound, [id number]- Traction (mechanical), Taping, Dry Needling, Joint mobilization, Joint manipulation, Cryotherapy, and Moist heat  PLAN FOR NEXT SESSION: Assess and progress HEP as indicated, strengthening, postural stability, scapular stabilization   Solon Palm, PT  10/30/23, 1:26 PM  [facility] [address] [place], [place] [address] Phone # [phone] Fax [phone]

## 2023-10-30 ENCOUNTER — Ambulatory Visit: Attending: Family Medicine | Admitting: Physical Therapy

## 2023-10-30 ENCOUNTER — Encounter: Payer: Self-pay | Admitting: Physical Therapy

## 2023-10-30 ENCOUNTER — Ambulatory Visit: Attending: Nurse Practitioner | Admitting: Physical Therapy

## 2023-10-30 DIAGNOSIS — M6281 Muscle weakness (generalized): Secondary | ICD-10-CM | POA: Insufficient documentation

## 2023-10-30 DIAGNOSIS — N816 Rectocele: Secondary | ICD-10-CM | POA: Insufficient documentation

## 2023-10-30 DIAGNOSIS — R252 Cramp and spasm: Secondary | ICD-10-CM | POA: Insufficient documentation

## 2023-10-30 DIAGNOSIS — M25511 Pain in right shoulder: Secondary | ICD-10-CM | POA: Insufficient documentation

## 2023-10-30 DIAGNOSIS — R1084 Generalized abdominal pain: Secondary | ICD-10-CM | POA: Insufficient documentation

## 2023-10-30 DIAGNOSIS — R293 Abnormal posture: Secondary | ICD-10-CM | POA: Insufficient documentation

## 2023-10-30 DIAGNOSIS — R102 Pelvic and perineal pain: Secondary | ICD-10-CM | POA: Insufficient documentation

## 2023-10-30 NOTE — Therapy (Signed)
OUTPATIENT PHYSICAL THERAPY SHOULDER TREATMENT NOTE   Patient Name: Meghan Barry MRN: [id number] DOB:1981/10/08, 42 y.o., female Today's Date: 10/30/2023  END OF SESSION:  PT End of Session - 10/30/23 1205     Visit Number 5    Date for PT Re-Evaluation 12/07/23    Authorization Type [employer]    PT Start Time 1100    PT Stop Time 1145    PT Time Calculation (min) 45 min    Activity Tolerance Patient tolerated treatment well    Behavior During Therapy [facility] for tasks assessed/performed              Past Medical History:  Diagnosis Date   Anxiety    Bilateral ovarian cysts    Depression    Insomnia    Stomach ulcer    Venous insufficiency    Venous insufficiency    ablations l leg 2009.,2011   Past Surgical History:  Procedure Laterality Date   ABDOMINAL HYSTERECTOMY  08/2017   anterior posterior vaginal wall repair     BIOPSY  08/01/2018   Procedure: BIOPSY;  Surgeon: Lynann Bologna, MD;  Location: [facility] ENDOSCOPY;  Service: Endoscopy;;   bladder tact  2018   BREAST CYST ASPIRATION Right    CHOLECYSTECTOMY N/A 10/03/2018   Procedure: DIAGNOSTIC LAPAROSCOPY, LAPAROSCOPIC CHOLECYSTECTOMY;  Surgeon: Gaynelle Adu, MD;  Location: [facility] ORS;  Service: General;  Laterality: N/A;   DILATION AND CURETTAGE OF UTERUS     ESOPHAGOGASTRODUODENOSCOPY N/A 08/01/2018   Procedure: ESOPHAGOGASTRODUODENOSCOPY (EGD);  Surgeon: Lynann Bologna, MD;  Location: [facility] ENDOSCOPY;  Service: Endoscopy;  Laterality: N/A;   left ovary removal  2011   OVARIAN CYST SURGERY     TONSILLECTOMY  1988   WRIST SURGERY  11/2015   bone graft   Patient Active Problem List   Diagnosis Date Noted   Cholecystitis 10/02/2018   Stomach ulcer    Gastritis 08/01/2018   Abdominal pain 07/30/2018   Anxiety 07/30/2018   Depression 07/30/2018   Peptic ulcer 07/30/2018    PCP: Henrine Screws, MD  REFERRING PROVIDER: Arlyce Harman, MD  REFERRING DIAG: M25.511 (ICD-10-CM) - Pain in right  shoulder  THERAPY DIAG:  Muscle weakness (generalized)  Generalized abdominal pain  Rectocele  Pelvic pain  Rationale for Evaluation and Treatment: Rehabilitation  ONSET DATE: Pt reports that 2 weeks ago, she started having increased right shoulder pain when she was doing plank.  SUBJECTIVE:                                                                                                                                                                                      SUBJECTIVE STATEMENT: Pt reports  that she went on a retreat [employer] that helped her deal with previous abortions and miscarriages and it was really healing, she fasted as well.      Pt reports that she goes for her shoulder injection later today.  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  Currently in Pelvic Floor PT at this clinic.  PAIN:  Are you having pain? Yes: NPRS scale: 3/10 Pain location: right shoulder Pain description: burning, sharp Aggravating factors: certain movements Relieving factors: rest  PRECAUTIONS: None  RED FLAGS: None   WEIGHT BEARING RESTRICTIONS: No  FALLS:  Has patient fallen in last 6 months? 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  NEXT MD VISIT: Pending cortisone shot upon insurance approval  OBJECTIVE:  Note: Objective measures were completed at Evaluation unless otherwise noted.  DIAGNOSTIC FINDINGS:  Pt states that the right shoulder  radiograph was unremarkable.  Cervical MRI on 08/28/2023: IMPRESSION: MRI scan cervical spine without contrast showing mild disc degenerative change at C3-4 but without significant compression.  PATIENT SURVEYS:  Eval:  UEFS 44 / 80 = 55.0 %  COGNITION: Overall cognitive status: Within functional limits for tasks assessed     SENSATION: Pt reports at times, she gets tingling in her fingers  POSTURE: Rounded shoulders, forward flexed, scoliosis, scapular "winging"  UPPER EXTREMITY ROM:   Active ROM Right eval Left eval  Shoulder flexion 135 162  Shoulder extension    Shoulder abduction 135 165  Shoulder adduction    Shoulder internal rotation To mid thoracic To upper thoracic  Shoulder external rotation To upper thoracic To mid-thoracic  (Blank rows = not tested)  UPPER EXTREMITY MMT:  10/16/2023 (Eval): Right shoulder strength grossly 4 to 4+/5 throughout Left shoulder is [facility]  SHOULDER SPECIAL TESTS: Impingement tests: Hawkins/Kennedy impingement test: positive  Rotator cuff assessment: Empty can test: positive    JOINT MOBILITY TESTING:  Patient with some joint laxity noted    TODAY'S TREATMENT:                                                                                                                                         DATE:  10/23/2023 UBE level 1.0 x3 min each direction with PT present to discuss status Standing 4D scapular stabilization with light blue 2# ball on wall  x20 each Standing performing 3 way wall clocks for scapular stabilization with blue loop 2x5 each Standing facing the  wall lower trap lift-offs x10 (with cuing for improved technique and posture) Supine serratus punch with 2# 2x10 bilat (cuing to maintain proper technique and not "over-doing" it) Supine letter tracing of alphabet with 2# dumbbell A-Z bilat Supine shoulder ER and horizontal abduction with red tband 2x10 each bilat Bear Planks 5x5 sec Quadruped Bird Dog  2x5   10/16/2023 Issued HEP and provided patient with yellow and red tband Demonstrated HEP x10 of each   PATIENT EDUCATION: Education details: Issued HEP Person educated: Patient Education method: Explanation, Demonstration, and Handouts Education comprehension: verbalized understanding and returned demonstration  HOME EXERCISE PROGRAM: Access Code: [email or web address] URL: [email or web address] Date: 10/23/2023 Prepared by: Clydie Braun Menke  Exercises - Seated Scapular Retraction  - 1 x daily - 7 x weekly - 2 sets - 10 reps - 5 sec hold - Standing Shoulder Abduction Wall Slide with Thumb Out  - 1 x daily - 7 x weekly - 2 sets - 10 reps - Shoulder Flexion Serratus Activation with Resistance  - 1 x daily - 7 x weekly - 2 sets - 10 reps - Wall Clock with Theraband  - 1 x daily - 7 x weekly - 2 sets - 5 reps - Shoulder External Rotation and Scapular Retraction with Resistance  - 1 x daily - 7 x weekly - 2 sets - 10 reps - Standing Shoulder Horizontal Abduction with Resistance  - 1 x daily - 7 x weekly - 2 sets - 10 reps - Bear Plank from [employer]  - 1 x daily - 7 x weekly - 5 reps - 5 sec hold - Quadruped Pelvic Floor Contraction with Opposite Arm and Leg Lift  - 1 x daily - 7 x weekly - 2 sets - 5-10 reps  ASSESSMENT:  CLINICAL IMPRESSION: Pt did well with abdominal scar needling and massage. She has tight scars and restrictions throughout abdomen. Will continue to benefit from PT. Discussed continuing pelvic floor and core exercises to reduce prolapse symptoms.  Meghan Barry presents to skilled PT reporting that she is feeling some better.  States that she does have some soreness after the exercise, but it subsides quickly.  Patient able to progress with exercises during clinic with minimal cuing for improved technique to decrease speed to allow for increased muscle activation and move within pain-free range.  Patient is progressing with increased scapular strengthening for increased  joint stabilization.  Added in exercises in quadruped for increased joint approximation of shoulder girdle for improved strengthening.   OBJECTIVE IMPAIRMENTS: decreased ROM, decreased strength, increased muscle spasms, impaired UE functional use, postural dysfunction, and pain.   ACTIVITY LIMITATIONS: carrying, lifting, sleeping, and reach over head  PARTICIPATION LIMITATIONS: cleaning, laundry, community activity, occupation, and yard work  PERSONAL FACTORS: Past/current experiences, Time since onset of injury/illness/exacerbation, and 3+ comorbidities: scoliosis, being tested for EDS, abdominal hysterectomy  are also affecting patient's functional outcome.   REHAB POTENTIAL: Good  CLINICAL DECISION MAKING: Evolving/moderate complexity  EVALUATION COMPLEXITY: Moderate   GOALS: Goals reviewed with patient? Yes  SHORT TERM GOALS: Target date: 11/02/2023  Patient will be independent with initial HEP. Baseline: Goal status: Met on 10/23/23  2.  Patient will demonstrate right shoulder A/ROM to [facility] to allow her to reach overhead without increased pain. Baseline:  Goal status: INITIAL   LONG TERM GOALS: Target date: 12/07/2023  Patient will be independent with advanced HEP. Baseline:  Goal status: INITIAL  2.  Patient will increase Upper Extremity Functional Scale to at least 65% to demonstrate improved  functional status. Baseline: 55% Goal status: INITIAL  3.  Patient will increase right shoulder/scapular strength to [facility] to allow her to complete various desired tasks without joint "popping out" or increased pain. Baseline:  Goal status: INITIAL  4.  Patient to report at least 75% improvement to allow her to return to pain-free swimming and exercise. Baseline:  Goal status: INITIAL   PLAN:  PT FREQUENCY: 1-2x/week  PT DURATION: 8 weeks  PLANNED INTERVENTIONS: 97110-Therapeutic exercises, 97530- Therapeutic activity, [id number]- Neuromuscular re-education, 97535- Self  Care, 95621- Manual therapy, [id number]- Aquatic Therapy, 97014- Electrical stimulation (unattended), [id number]- Electrical stimulation (manual), 97016- Vasopneumatic device, [id number]- Ultrasound, [id number]- Traction (mechanical), Taping, Dry Needling, Joint mobilization, Joint manipulation, Cryotherapy, and Moist heat  PLAN FOR NEXT SESSION: Assess and progress HEP as indicated, strengthening, postural stability, scapular stabilization   Reather Laurence, PT, DPT 10/30/23, 1:24 PM  [facility] [address], [address] [place], [place] [address] Phone # [phone] Fax [phone]

## 2023-11-06 ENCOUNTER — Encounter: Admitting: Physical Therapy

## 2023-11-06 ENCOUNTER — Encounter: Admitting: Rehabilitative and Restorative Service Providers"

## 2023-11-12 NOTE — Therapy (Addendum)
OUTPATIENT PHYSICAL THERAPY SHOULDER TREATMENT NOTE AND DISCHARGE SUMMARY   Patient Name: Meghan Barry MRN: [id number] DOB:1981-04-21, 42 y.o., female Today's Date: 11/13/2023  END OF SESSION:  PT End of Session - 11/13/23 0931     Visit Number 6    Date for PT Re-Evaluation 12/07/23    Authorization Type [employer]    PT Start Time 0931    PT Stop Time 1014    PT Time Calculation (min) 43 min    Activity Tolerance Patient tolerated treatment well    Behavior During Therapy [facility] for tasks assessed/performed               Past Medical History:  Diagnosis Date   Anxiety    Bilateral ovarian cysts    Depression    Insomnia    Stomach ulcer    Venous insufficiency    Venous insufficiency    ablations l leg 2009.,2011   Past Surgical History:  Procedure Laterality Date   ABDOMINAL HYSTERECTOMY  08/2017   anterior posterior vaginal wall repair     BIOPSY  08/01/2018   Procedure: BIOPSY;  Surgeon: Lynann Bologna, MD;  Location: [facility] ENDOSCOPY;  Service: Endoscopy;;   bladder tact  2018   BREAST CYST ASPIRATION Right    CHOLECYSTECTOMY N/A 10/03/2018   Procedure: DIAGNOSTIC LAPAROSCOPY, LAPAROSCOPIC CHOLECYSTECTOMY;  Surgeon: Gaynelle Adu, MD;  Location: [facility] ORS;  Service: General;  Laterality: N/A;   DILATION AND CURETTAGE OF UTERUS     ESOPHAGOGASTRODUODENOSCOPY N/A 08/01/2018   Procedure: ESOPHAGOGASTRODUODENOSCOPY (EGD);  Surgeon: Lynann Bologna, MD;  Location: [facility] ENDOSCOPY;  Service: Endoscopy;  Laterality: N/A;   left ovary removal  2011   OVARIAN CYST SURGERY     TONSILLECTOMY  1988   WRIST SURGERY  11/2015   bone graft   Patient Active Problem List   Diagnosis Date Noted   Cholecystitis 10/02/2018   Stomach ulcer    Gastritis 08/01/2018   Abdominal pain 07/30/2018   Anxiety 07/30/2018   Depression 07/30/2018   Peptic ulcer 07/30/2018    PCP: Henrine Screws, MD  REFERRING PROVIDER: Arlyce Harman, MD  REFERRING DIAG: M25.511 (ICD-10-CM) - Pain  in right shoulder  THERAPY DIAG:  Right shoulder pain, unspecified chronicity  Muscle weakness (generalized)  Cramp and spasm  Abnormal posture  Rationale for Evaluation and Treatment: Rehabilitation  ONSET DATE: Pt reports that 2 weeks ago, she started having increased right shoulder pain when she was doing plank.  SUBJECTIVE:  SUBJECTIVE STATEMENT: Patient reports that she had increased R neck/UT pain after repeating a new exercise from last session. It is still very tender and tight.   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  Currently in Pelvic Floor PT at this clinic.  PAIN:  Are you having pain? Yes: NPRS scale: 3/10 Pain location: right shoulder Pain description: burning, sharp Aggravating factors: certain movements Relieving factors: rest  PRECAUTIONS: None  RED FLAGS: None   WEIGHT BEARING RESTRICTIONS: No  FALLS:  Has patient fallen in last 6 months? 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  NEXT MD VISIT: Pending cortisone shot upon insurance approval  OBJECTIVE:  Note: Objective measures were completed at Evaluation unless otherwise noted.  DIAGNOSTIC FINDINGS:  Pt states that the right shoulder radiograph was unremarkable.  Cervical MRI on  08/28/2023: IMPRESSION: MRI scan cervical spine without contrast showing mild disc degenerative change at C3-4 but without significant compression.  PATIENT SURVEYS:  Eval:  UEFS 44 / 80 = 55.0 %  COGNITION: Overall cognitive status: Within functional limits for tasks assessed     SENSATION: Pt reports at times, she gets tingling in her fingers  POSTURE: Rounded shoulders, forward flexed, scoliosis, scapular "winging"  UPPER EXTREMITY ROM:   Active ROM Right eval Left eval  Shoulder flexion 135 162  Shoulder extension    Shoulder abduction 135 165  Shoulder adduction    Shoulder internal rotation To mid thoracic To upper thoracic  Shoulder external rotation To upper thoracic To mid-thoracic  (Blank rows = not tested)  UPPER EXTREMITY MMT:  10/16/2023 (Eval): Right shoulder strength grossly 4 to 4+/5 throughout Left shoulder is [facility]  SHOULDER SPECIAL TESTS: Impingement tests: Hawkins/Kennedy impingement test: positive  Rotator cuff assessment: Empty can test: positive    JOINT MOBILITY TESTING:  Patient with some joint laxity noted    TODAY'S TREATMENT:                                                                                                                                         DATE:  11/13/2023 Standing 3 way towel slides on wall with pressure x 10, then post DN x 10 Standing 4D scapular stabilization with light blue 2# ball on wall  x20 each Standing facing the wall lower trap lift-offs + scapular depression 1 x10 (with cuing for improved technique and posture) also did prone single arm to decrease shoulder elevation. Bow  and arrow Red Tband x 3 Rows x 10 Manual: Skilled palpation and monitoring of soft tissues during DN. STM to R UT, levator scap and rhomboids. Trigger Point Dry-Needling  Treatment instructions: Expect mild to moderate muscle soreness. S/S of pneumothorax if dry needled over a lung field, and to seek immediate medical attention should they  occur. Patient verbalized understanding of these instructions and education. Patient Consent Given: Yes Education handout provided: Previously provided Muscles treated: R UT, levator and rhomboids Electrical stimulation performed: No Parameters: N/A Treatment response/outcome: Twitch Response Elicited and Palpable Increase in Muscle Length   10/30/2023 Standing 4D scapular stabilization with light blue 2# ball on wall  x20 each Standing performing 3 way wall clocks for scapular stabilization with blue loop 2x5 each Standing facing the wall lower trap lift-offs + scapular depression 2 x10 (with cuing for improved technique and posture) Prone Y lift with lat pull down x 10 - cues to keep legs down and stabilize with abs Supine serratus punch with 3# 2x10 bilat (cuing to maintain proper technique and not "over-doing" it) Supine letter tracing of alphabet with 3# dumbbell A-Z bilat Supine shoulder ER and horizontal abduction with red tband 1x10 each bilat, also did in sitting and standing with noodle along her spine to decrease thoracic protraction. Knee plank B shoulder slide OH to lat pull down. Push up to start position x 15 Bow and arrow Red Tband x 10  10/23/2023 UBE level 1.0 x3 min each direction with PT present to discuss status Standing 4D scapular stabilization with light blue 2# ball on wall  x20 each Standing performing 3 way wall clocks for scapular stabilization with blue loop 2x5 each Standing facing the wall lower trap lift-offs x10 (with cuing for improved technique and posture) Supine serratus punch with 2# 2x10 bilat (cuing to maintain proper technique and not "over-doing" it) Supine letter tracing of alphabet with 2# dumbbell A-Z bilat Supine shoulder ER and horizontal abduction with red tband 2x10 each bilat Bear Planks 5x5 sec Quadruped Bird Dog 2x5   10/16/2023 Issued HEP and provided patient with yellow and red tband Demonstrated HEP x10 of each   PATIENT  EDUCATION: Education details: Issued HEP Person educated: Patient Education method: Explanation, Demonstration, and Handouts Education comprehension: verbalized understanding and returned demonstration  HOME EXERCISE PROGRAM: Access Code: [email or web address] URL: [email or web address] Date: 11/13/2023 Prepared by: Raynelle Fanning  Exercises - Seated Scapular Retraction  - 1 x daily - 7 x weekly - 2 sets - 10 reps - 5 sec hold - Standing Shoulder Abduction Wall Slide with Thumb Out  - 1 x daily - 7 x weekly - 2 sets - 10 reps - Shoulder Flexion Serratus Activation with Resistance  - 1 x daily - 7 x weekly - 2 sets - 10 reps - Wall Clock with Theraband  - 1 x daily - 7 x weekly - 2 sets - 5 reps - Shoulder External Rotation and Scapular Retraction with Resistance  - 1 x daily - 7 x weekly - 2 sets - 10 reps - Standing Shoulder Horizontal Abduction with Resistance  - 1 x daily - 7 x weekly - 2 sets - 10 reps - Bear Plank from [employer]  - 1 x daily - 7 x weekly - 5 reps - 5 sec hold - Quadruped Pelvic Floor Contraction with Opposite Arm and Leg Lift  - 1 x daily - 7 x weekly - 2 sets - 5-10 reps - Standing Shoulder Row with Anchored Resistance  -  1 x daily - 4 x weekly - 1-3 sets - 10 reps - Shoulder Flexion Wall Slide with Towel  - 1 x daily - 3-4 x weekly - 2 sets - 10 reps - Low Trap Setting at Wall  - 1 x daily - 3-4 x weekly - 2 sets - 10 reps  ASSESSMENT:  CLINICAL IMPRESSION: Meghan Barry reported increased spasm and neck pain on the R since last visit. Excellent response to DN/MT with immediate decrease in muscle tension and improved ability to tolerate therex that she could not tolerate at start of session today.  We focused exercise on low traps activation and being aware of not activating levator with current exercises. High row (bow and arrow) swapped out for low row due to shoulder elevation. Meghan Barry will continue to benefit from mid and low trap strengthening and shoulder stabilization to meet  her LTGs.    OBJECTIVE IMPAIRMENTS: decreased ROM, decreased strength, increased muscle spasms, impaired UE functional use, postural dysfunction, and pain.   ACTIVITY LIMITATIONS: carrying, lifting, sleeping, and reach over head  PARTICIPATION LIMITATIONS: cleaning, laundry, community activity, occupation, and yard work  PERSONAL FACTORS: Past/current experiences, Time since onset of injury/illness/exacerbation, and 3+ comorbidities: scoliosis, being tested for EDS, abdominal hysterectomy  are also affecting patient's functional outcome.   REHAB POTENTIAL: Good  CLINICAL DECISION MAKING: Evolving/moderate complexity  EVALUATION COMPLEXITY: Moderate   GOALS: Goals reviewed with patient? Yes  SHORT TERM GOALS: Target date: 11/02/2023  Patient will be independent with initial HEP. Baseline: Goal status: Met on 10/23/23  2.  Patient will demonstrate right shoulder A/ROM to [facility] to allow her to reach overhead without increased pain. Baseline:  Goal status: MET   LONG TERM GOALS: Target date: 12/07/2023  Patient will be independent with advanced HEP. Baseline:  Goal status: INITIAL  2.  Patient will increase Upper Extremity Functional Scale to at least 65% to demonstrate improved functional status. Baseline: 55% Goal status: INITIAL  3.  Patient will increase right shoulder/scapular strength to [facility] to allow her to complete various desired tasks without joint "popping out" or increased pain. Baseline:  Goal status: INITIAL  4.  Patient to report at least 75% improvement to allow her to return to pain-free swimming and exercise. Baseline:  Goal status: INITIAL   PLAN:  PT FREQUENCY: 1-2x/week  PT DURATION: 8 weeks  PLANNED INTERVENTIONS: 97110-Therapeutic exercises, 97530- Therapeutic activity, [id number]- Neuromuscular re-education, 97535- Self Care, 16109- Manual therapy, [phone]- Aquatic Therapy, 97014- Electrical stimulation (unattended), [id number]- Electrical stimulation  (manual), 97016- Vasopneumatic device, 97035- Ultrasound, [id number]- Traction (mechanical), Taping, Dry Needling, Joint mobilization, Joint manipulation, Cryotherapy, and Moist heat  PLAN FOR NEXT SESSION: see d/c summary below   Solon Palm, PT  11/13/23, 5:35 PM  [facility] [address], [address] [place], [place] [address] Phone # [phone] Fax [phone]   PHYSICAL THERAPY DISCHARGE SUMMARY  Visits from Start of Care: 4  Current functional level related to goals / functional outcomes: Unknown as pt did not return for f/u visits.   Remaining deficits: Shoulder weakness and pain   Education / Equipment: HEP, Bearl [place]   Patient agrees to discharge. Patient goals were not met. Patient is being discharged due to the patient's request. Patient requested to be discharged from Ortho PT for her shoulder on 12/11/23.  Solon Palm, PT 12/11/23 10:42 AM [facility] [address], [address] [place], [place] [address] Phone # [phone] Fax [phone]

## 2023-11-13 ENCOUNTER — Ambulatory Visit: Admitting: Physical Therapy

## 2023-11-13 ENCOUNTER — Encounter: Payer: Self-pay | Admitting: Physical Therapy

## 2023-11-13 DIAGNOSIS — M6281 Muscle weakness (generalized): Secondary | ICD-10-CM

## 2023-11-13 DIAGNOSIS — R293 Abnormal posture: Secondary | ICD-10-CM

## 2023-11-13 DIAGNOSIS — R252 Cramp and spasm: Secondary | ICD-10-CM

## 2023-11-13 DIAGNOSIS — M25511 Pain in right shoulder: Secondary | ICD-10-CM | POA: Diagnosis not present

## 2023-11-20 ENCOUNTER — Ambulatory Visit: Admitting: Rehabilitative and Restorative Service Providers"

## 2023-11-20 ENCOUNTER — Ambulatory Visit: Admitting: Physical Therapy

## 2023-11-20 ENCOUNTER — Telehealth: Payer: Self-pay | Admitting: Rehabilitative and Restorative Service Providers"

## 2023-11-20 NOTE — Telephone Encounter (Signed)
Called patient secondary to missed Ortho PT appointment on 11/20/2023.  Left message with patient to notify of missed visit and that it was the last scheduled ortho visit for her shoulder.  Asked pt to please call back if she was wanting to reschedule more visits for her shoulder.  Reminded patient of next scheduled Pelvic PT appointment.

## 2023-11-27 ENCOUNTER — Ambulatory Visit: Admitting: Physical Therapy

## 2023-12-04 ENCOUNTER — Ambulatory Visit: Attending: Nurse Practitioner | Admitting: Physical Therapy

## 2023-12-04 ENCOUNTER — Encounter (HOSPITAL_COMMUNITY): Payer: Self-pay

## 2023-12-04 ENCOUNTER — Encounter: Payer: Self-pay | Admitting: Physical Therapy

## 2023-12-04 ENCOUNTER — Emergency Department (HOSPITAL_COMMUNITY)

## 2023-12-04 ENCOUNTER — Observation Stay (HOSPITAL_COMMUNITY)
Admission: EM | Admit: 2023-12-04 | Discharge: 2023-12-05 | Discharge status: Against Medical Advice | Attending: Internal Medicine | Admitting: Internal Medicine

## 2023-12-04 DIAGNOSIS — R252 Cramp and spasm: Secondary | ICD-10-CM | POA: Diagnosis present

## 2023-12-04 DIAGNOSIS — R110 Nausea: Secondary | ICD-10-CM | POA: Insufficient documentation

## 2023-12-04 DIAGNOSIS — E876 Hypokalemia: Secondary | ICD-10-CM

## 2023-12-04 DIAGNOSIS — Z87891 Personal history of nicotine dependence: Secondary | ICD-10-CM | POA: Diagnosis not present

## 2023-12-04 DIAGNOSIS — M25511 Pain in right shoulder: Secondary | ICD-10-CM | POA: Insufficient documentation

## 2023-12-04 DIAGNOSIS — K279 Peptic ulcer, site unspecified, unspecified as acute or chronic, without hemorrhage or perforation: Secondary | ICD-10-CM | POA: Insufficient documentation

## 2023-12-04 DIAGNOSIS — F32A Depression, unspecified: Secondary | ICD-10-CM | POA: Diagnosis present

## 2023-12-04 DIAGNOSIS — N816 Rectocele: Secondary | ICD-10-CM | POA: Diagnosis present

## 2023-12-04 DIAGNOSIS — R1084 Generalized abdominal pain: Secondary | ICD-10-CM | POA: Insufficient documentation

## 2023-12-04 DIAGNOSIS — R102 Pelvic and perineal pain: Secondary | ICD-10-CM | POA: Diagnosis present

## 2023-12-04 DIAGNOSIS — R109 Unspecified abdominal pain: Secondary | ICD-10-CM | POA: Diagnosis present

## 2023-12-04 DIAGNOSIS — Z79899 Other long term (current) drug therapy: Secondary | ICD-10-CM | POA: Insufficient documentation

## 2023-12-04 DIAGNOSIS — R293 Abnormal posture: Secondary | ICD-10-CM | POA: Diagnosis present

## 2023-12-04 DIAGNOSIS — F419 Anxiety disorder, unspecified: Secondary | ICD-10-CM | POA: Diagnosis present

## 2023-12-04 DIAGNOSIS — M6281 Muscle weakness (generalized): Secondary | ICD-10-CM | POA: Insufficient documentation

## 2023-12-04 LAB — COMPREHENSIVE METABOLIC PANEL
ALT: 22 U/L (ref 0–44)
AST: 25 U/L (ref 15–41)
Albumin: 4.2 g/dL (ref 3.5–5.0)
Alkaline Phosphatase: 35 U/L — ABNORMAL LOW (ref 38–126)
Anion gap: 8 (ref 5–15)
BUN: 8 mg/dL (ref 6–20)
CO2: 27 mmol/L (ref 22–32)
Calcium: 9.4 mg/dL (ref 8.9–10.3)
Chloride: 103 mmol/L (ref 98–111)
Creatinine, Ser: 0.91 mg/dL (ref 0.44–1.00)
GFR, Estimated: >60 mL/min (ref >60)
Glucose, Bld: 81 mg/dL (ref 70–99)
Potassium: 3.3 mmol/L — ABNORMAL LOW (ref 3.5–5.1)
Sodium: 138 mmol/L (ref 135–145)
Total Bilirubin: 1.2 mg/dL — ABNORMAL HIGH (ref <1.2)
Total Protein: 6.9 g/dL (ref 6.5–8.1)

## 2023-12-04 LAB — URINALYSIS, ROUTINE W REFLEX MICROSCOPIC
Bacteria, UA: NONE SEEN
Bilirubin Urine: NEGATIVE
Glucose, UA: NEGATIVE mg/dL
Hgb urine dipstick: NEGATIVE
Ketones, ur: 20 mg/dL — AB
Leukocytes,Ua: NEGATIVE
Nitrite: NEGATIVE
Protein, ur: 30 mg/dL — AB
Specific Gravity, Urine: 1.023 (ref 1.005–1.030)
pH: 5 (ref 5.0–8.0)

## 2023-12-04 LAB — HCG, QUANTITATIVE, PREGNANCY: hCG, Beta Chain, Quant, S: <1 m[IU]/mL (ref <5)

## 2023-12-04 LAB — CBC
HCT: 41.4 % (ref 36.0–46.0)
Hemoglobin: 14.3 g/dL (ref 12.0–15.0)
MCH: 32 pg (ref 26.0–34.0)
MCHC: 34.5 g/dL (ref 30.0–36.0)
MCV: 92.6 fL (ref 80.0–100.0)
Platelets: 290 10*3/uL (ref 150–400)
RBC: 4.47 MIL/uL (ref 3.87–5.11)
RDW: 12.4 % (ref 11.5–15.5)
WBC: 3.8 10*3/uL — ABNORMAL LOW (ref 4.0–10.5)
nRBC: 0 % (ref 0.0–0.2)

## 2023-12-04 LAB — I-STAT CG4 LACTIC ACID, ED: Lactic Acid, Venous: 0.7 mmol/L (ref 0.5–1.9)

## 2023-12-04 LAB — POC OCCULT BLOOD, ED: Fecal Occult Bld: NEGATIVE

## 2023-12-04 LAB — LIPASE, BLOOD: Lipase: 24 U/L (ref 11–51)

## 2023-12-04 MED ORDER — OXYCODONE-ACETAMINOPHEN 5-325 MG PO TABS
1.0000 | ORAL_TABLET | Freq: Once | ORAL | Status: AC
Start: 2023-12-04 — End: 2023-12-04
  Administered 2023-12-04: 1 via ORAL
  Filled 2023-12-04: qty 1

## 2023-12-04 MED ORDER — FENTANYL CITRATE PF 50 MCG/ML IJ SOSY
50.0000 ug | PREFILLED_SYRINGE | Freq: Once | INTRAMUSCULAR | Status: AC
  Administered 2023-12-04: 50 ug via INTRAVENOUS
  Filled 2023-12-04: qty 1

## 2023-12-04 MED ORDER — ONDANSETRON HCL 4 MG/2ML IJ SOLN
4.0000 mg | Freq: Four times a day (QID) | INTRAMUSCULAR | Status: DC | PRN
  Administered 2023-12-05 (×2): 4 mg via INTRAVENOUS
  Filled 2023-12-04 (×2): qty 2

## 2023-12-04 MED ORDER — IOHEXOL 350 MG/ML SOLN
75.0000 mL | Freq: Once | INTRAVENOUS | Status: AC | PRN
  Administered 2023-12-04: 75 mL via INTRAVENOUS

## 2023-12-04 MED ORDER — FAMOTIDINE IN NACL 20-0.9 MG/50ML-% IV SOLN
20.0000 mg | INTRAVENOUS | Status: AC
  Administered 2023-12-04: 20 mg via INTRAVENOUS
  Filled 2023-12-04: qty 50

## 2023-12-04 MED ORDER — ONDANSETRON 4 MG PO TBDP
4.0000 mg | ORAL_TABLET | Freq: Once | ORAL | Status: AC
  Administered 2023-12-04: 4 mg via ORAL
  Filled 2023-12-04: qty 1

## 2023-12-04 NOTE — ED Triage Notes (Signed)
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

## 2023-12-04 NOTE — ED Provider Notes (Signed)
[facility] AT [facility] Provider Note   CSN: [id number] Arrival date & time: 12/04/23  1606     History  Chief Complaint  Patient presents with   Abdominal Pain    Meghan Barry is a 42 y.o. female with PMHx intermittent intussusception, SMA syndrome, pelvic floor dysfunction, anxiety who presents to ED concerned for abdominal pain x1 day. Patient also endorsing small amount of dark jelly BM this morning. Not tolerating PO intake. Patient has had this intermittent abdominal pain x2 years but symptoms are more severe today. Follows with [facility].   Denies fever, vomiting, diarrhea. Endorses 8 abdominal surgeries in the past.   Abdominal Pain      Home Medications Prior to Admission medications   Medication Sig Start Date End Date Taking? Authorizing Provider  APPLE CIDER VINEGAR PO Take 1 each by mouth daily.   Yes [provider]  Ascorbic Acid (VITAMIN C) 100 MG tablet Take 100 mg by mouth daily.   Yes [provider]  ASTRAGALUS PO Take 1,000 mg by mouth daily.   Yes [provider]  cholecalciferol (VITAMIN D3) 25 MCG (1000 UNIT) tablet Take 1,000 Units by mouth daily.   Yes [provider]  Cyanocobalamin (B-12 PO) Take 1,000 mcg by mouth.   Yes [provider]  DOCOSAHEXAENOIC ACID PO Take by mouth.   Yes [provider]  ferrous sulfate 325 (65 FE) MG EC tablet Take 325 mg by mouth daily.   Yes [provider]  lidocaine (XYLOCAINE) 2 % jelly Place 1 Application into the urethra as needed.   Yes [provider]  Multiple Vitamin (MULTIVITAMIN) capsule Take 1 capsule by mouth daily.   Yes [provider]  niacin (TRUE VITAMIN B3) 100 MG tablet Take 100 mg by mouth at bedtime.   Yes [provider]  Omega-3 Fatty Acids (FISH OIL) 1000 MG CAPS Take 1,000 mg by mouth daily.   Yes [provider]  PANTOTHENIC ACID PO Take 1 tablet by mouth  daily.   Yes [provider]  pyridOXINE (VITAMIN B6) 100 MG tablet Take 100 mg by mouth daily.   Yes [provider]  riboflavin (VITAMIN B-2) 100 MG TABS tablet Take 100 mg by mouth daily.   Yes [provider]  thiamine (VITAMIN B-1) 100 MG tablet Take 100 mg by mouth daily.   Yes [provider]  vitamin B-12 (CYANOCOBALAMIN) 250 MCG tablet Take 250 mcg by mouth daily.   Yes [provider]  VYVANSE 30 MG capsule Take 30 mg by mouth every morning. 10/05/20  Yes [provider]      Allergies    Bactrim [sulfamethoxazole-trimethoprim], Ciprofloxacin, Codeine, Dilaudid [hydromorphone hcl], Lunesta [eszopiclone], Morphine and codeine, Aripiprazole, Duloxetine hcl, Hydrocodone-acetaminophen, Amoxicillin, and Penicillins    Review of Systems   Review of Systems  Gastrointestinal:  Positive for abdominal pain.    Physical Exam Updated Vital Signs BP 107/75   Pulse 61   Temp 98.4 F (36.9 C) (Oral)   Resp 20   SpO2 100%  Physical Exam Vitals and nursing note reviewed.  Constitutional:      General: She is in acute distress.     Appearance: She is not ill-appearing or toxic-appearing.  HENT:     Head: Normocephalic and atraumatic.     Mouth/Throat:     Mouth: Mucous membranes are moist.     Pharynx: No oropharyngeal exudate or posterior oropharyngeal erythema.  Eyes:  General: No scleral icterus.       Right eye: No discharge.        Left eye: No discharge.     Conjunctiva/sclera: Conjunctivae normal.  Cardiovascular:     Rate and Rhythm: Normal rate and regular rhythm.     Pulses: Normal pulses.     Heart sounds: No murmur heard. Pulmonary:     Effort: Pulmonary effort is normal.  Abdominal:     General: Abdomen is flat. Bowel sounds are normal. There is no distension.     Palpations: Abdomen is soft. There is no mass.     Tenderness: There is abdominal tenderness.     Comments: Generalized tenderness most severe  in middle and right abdomen.   Musculoskeletal:     Right lower leg: No edema.     Left lower leg: No edema.  Skin:    General: Skin is warm and dry.     Findings: No rash.  Neurological:     General: No focal deficit present.     Mental Status: She is alert. Mental status is at baseline.  Psychiatric:        Mood and Affect: Mood normal.     ED Results / Procedures / Treatments   Labs (all labs ordered are listed, but only abnormal results are displayed) Labs Reviewed  COMPREHENSIVE METABOLIC PANEL - Abnormal; Notable for the following components:      Result Value   Potassium 3.3 (*)    Alkaline Phosphatase 35 (*)    Total Bilirubin 1.2 (*)    All other components within normal limits  CBC - Abnormal; Notable for the following components:   WBC 3.8 (*)    All other components within normal limits  URINALYSIS, ROUTINE W REFLEX MICROSCOPIC - Abnormal; Notable for the following components:   Ketones, ur 20 (*)    Protein, ur 30 (*)    All other components within normal limits  LIPASE, BLOOD  HCG, QUANTITATIVE, PREGNANCY  I-STAT CG4 LACTIC ACID, ED  POC OCCULT BLOOD, ED    EKG None  Radiology CT Angio Abd/Pel W and/or Wo Contrast  Result Date: 12/04/2023 CLINICAL DATA:  Insert history.  Abdominal pain EXAM: CTA ABDOMEN AND PELVIS WITHOUT AND WITH CONTRAST TECHNIQUE: Multidetector CT imaging of the abdomen and pelvis was performed using the standard protocol during bolus administration of intravenous contrast. Multiplanar reconstructed images and MIPs were obtained and reviewed to evaluate the vascular anatomy. RADIATION DOSE REDUCTION: This exam was performed according to the departmental dose-optimization program which includes automated exposure control, adjustment of the mA and/or kV according to patient size and/or use of iterative reconstruction technique. CONTRAST:  75mL OMNIPAQUE IOHEXOL 350 MG/ML SOLN COMPARISON:  10/28/2021 FINDINGS: VASCULAR Aorta: Normal caliber  aorta without aneurysm, dissection, vasculitis or significant stenosis. Celiac: Patent without evidence of aneurysm, dissection, vasculitis or significant stenosis. SMA: Patent without evidence of aneurysm, dissection, vasculitis or significant stenosis. Renals: Both renal arteries are patent without evidence of aneurysm, dissection, vasculitis, fibromuscular dysplasia or significant stenosis. IMA: Patent without evidence of aneurysm, dissection, vasculitis or significant stenosis. Inflow: Patent without evidence of aneurysm, dissection, vasculitis or significant stenosis. Proximal Outflow: Bilateral common femoral and visualized portions of the superficial and profunda femoral arteries are patent without evidence of aneurysm, dissection, vasculitis or significant stenosis. Veins: No obvious venous abnormality within the limitations of this arterial phase study. Review of the MIP images confirms the above findings. NON-VASCULAR Lower chest: No acute abnormality. Hepatobiliary: No focal liver  abnormality is seen. Status post cholecystectomy. No biliary dilatation. Pancreas: No focal abnormality or ductal dilatation. Spleen: No focal abnormality.  Normal size. Adrenals/Urinary Tract: No adrenal abnormality. No focal renal abnormality. No stones or hydronephrosis. Urinary bladder is unremarkable. Stomach/Bowel: Stomach, large and small bowel grossly unremarkable. Appendix is normal caliber measuring 3-4 mm. Small amount of fluid adjacent to the appendix. Lymphatic: No adenopathy Reproductive: Prior hysterectomy.  No adnexal masses. Other: Trace fluid adjacent to the appendix in the right lower quadrant. Musculoskeletal: No acute bony abnormality. IMPRESSION: VASCULAR Aorta and mesenteric vessels widely patent and unremarkable. NON-VASCULAR No bowel changes to suggest mesenteric ischemia. Small amount of fluid adjacent to the appendix. The appendix appears normal and is normal caliber. Recommend clinical correlation to  exclude early appendicitis. Electronically Signed   By: Charlett Nose M.D.   On: 12/04/2023 21:50    Procedures Procedures    Medications Ordered in ED Medications  famotidine (PEPCID) IVPB 20 mg premix (20 mg Intravenous New Bag/Given 12/04/23 2300)  ondansetron (ZOFRAN) injection 4 mg (has no administration in time range)  oxyCODONE-acetaminophen (PERCOCET/ROXICET) 5-325 MG per tablet 1 tablet (1 tablet Oral Given 12/04/23 1719)  ondansetron (ZOFRAN-ODT) disintegrating tablet 4 mg (4 mg Oral Given 12/04/23 1719)  iohexol (OMNIPAQUE) 350 MG/ML injection 75 mL (75 mLs Intravenous Contrast Given 12/04/23 2134)  fentaNYL (SUBLIMAZE) injection 50 mcg (50 mcg Intravenous Given 12/04/23 2300)    ED Course/ Medical Decision Making/ A&P                                 Medical Decision Making Amount and/or Complexity of Data Reviewed Labs: ordered. Radiology: ordered.  Risk Prescription drug management.    This patient presents to the ED for concern of abdominal pain, this involves an extensive number of treatment options, and is a complaint that carries with it a high risk of complications and morbidity.  The differential diagnosis includes gastroenteritis, colitis, small bowel obstruction, appendicitis, cholecystitis, pancreatitis, nephrolithiasis, UTI, pyelonephritis   Co morbidities that complicate the patient evaluation  intermittent intussusception, SMA syndrome, pelvic floor dysfunction, anxiety   Additional history obtained:  Additional history obtained from outpatient paperwork: patient bringing paperwork with her of recent MRA abdomen showing SMA syndrome and idiopathic intussusception   Lab Tests:  I Ordered, and personally interpreted labs.  The pertinent results include: CBC with differential: No concern for anemia or leukocytosis CMP: mild hypokalemia Lipase: within normal limits -UA: not concerning for infection -HCG: negative -POC occult blood: negative -LA:  within normal limits   Imaging Studies ordered:  I ordered imaging studies including   -CT Abd/Pelvis with contrast: evaluate for structural/surgical etiology of patients' severe abdominal pain.  I independently visualized and interpreted imaging I agree with the radiologist interpretation   Consultations Obtained:  I requested consultation with the General Surgery Dr. Dossie Der,  and discussed lab and imaging findings as well as pertinent plan - they recommend: inpatient admission for monitoring appendicitis   Problem List / ED Course / Critical interventions / Medication management  Admitting patient for possible appendicitis and not tolerating PO intake Patient with PMHx SMA syndrome and intermittent intussusception who presents to ED concerned for generalized abdominal pain most severe in middle and right lower abdomen. Patient also concerned with small dark/bloody jelly stool this morning. Not tolerating PO intake d/t severe pain and nausea with mild food intake. Vitals stable. Physical exam with generalized abdominal tenderness with more  severe tenderness in middle and right sided abdomen.  CMP reassuring.  CBC without leukocytosis or anemia.  Lipase within normal limits.  hCG negative.  POC occult blood negative.  UA without concern for infection.  Lactic acid within normal limits. CT showing fluid around appendix - given physical exam, early appendicitis cannot be ruled out.  Consulted with General Surgeon Dr. Dossie Der who recommends medical admission and will follow with patient in the morning to see if appendicitis presents itself better. He does not recommend ABX at this time. Does recommend GI consult if appendicitis does not present itself by tomorrow. I have reviewed the patients home medicines and have made adjustments as needed. Dr. Loney Loh Admitting provider.   Social Determinants of Health:  none        Final Clinical Impression(s) / ED Diagnoses Final  diagnoses:  Generalized abdominal pain    Rx / DC Orders ED Discharge Orders     None         Dorthy Cooler, [place] 12/04/23 2317    Laurence Spates, MD 12/05/23 1455

## 2023-12-04 NOTE — Therapy (Addendum)
OUTPATIENT PHYSICAL THERAPY PELVIC/SHOULDER TREATMENT NOTE   Patient Name: Meghan Barry MRN: [id number] DOB:September 27, 1981, 42 y.o., female Today's Date: 12/04/2023  END OF SESSION:  PT End of Session - 12/04/23 0920     Visit Number 7    Date for PT Re-Evaluation 12/07/23    Authorization Type [employer]    PT Start Time 0845    PT Stop Time 0930    PT Time Calculation (min) 45 min    Activity Tolerance Patient tolerated treatment well    Behavior During Therapy [facility] for tasks assessed/performed               Past Medical History:  Diagnosis Date   Anxiety    Bilateral ovarian cysts    Depression    Insomnia    Stomach ulcer    Venous insufficiency    Venous insufficiency    ablations l leg 2009.,2011   Past Surgical History:  Procedure Laterality Date   ABDOMINAL HYSTERECTOMY  08/2017   anterior posterior vaginal wall repair     BIOPSY  08/01/2018   Procedure: BIOPSY;  Surgeon: Lynann Bologna, MD;  Location: [facility] ENDOSCOPY;  Service: Endoscopy;;   bladder tact  2018   BREAST CYST ASPIRATION Right    CHOLECYSTECTOMY N/A 10/03/2018   Procedure: DIAGNOSTIC LAPAROSCOPY, LAPAROSCOPIC CHOLECYSTECTOMY;  Surgeon: Gaynelle Adu, MD;  Location: [facility] ORS;  Service: General;  Laterality: N/A;   DILATION AND CURETTAGE OF UTERUS     ESOPHAGOGASTRODUODENOSCOPY N/A 08/01/2018   Procedure: ESOPHAGOGASTRODUODENOSCOPY (EGD);  Surgeon: Lynann Bologna, MD;  Location: [facility] ENDOSCOPY;  Service: Endoscopy;  Laterality: N/A;   left ovary removal  2011   OVARIAN CYST SURGERY     TONSILLECTOMY  1988   WRIST SURGERY  11/2015   bone graft   Patient Active Problem List   Diagnosis Date Noted   Cholecystitis 10/02/2018   Stomach ulcer    Gastritis 08/01/2018   Abdominal pain 07/30/2018   Anxiety 07/30/2018   Depression 07/30/2018   Peptic ulcer 07/30/2018    PCP: Henrine Screws, MD  REFERRING PROVIDER: Arlyce Harman, MD  REFERRING DIAG: M25.511 (ICD-10-CM) - Pain in right  shoulder  THERAPY DIAG:  Abnormal posture  Generalized abdominal pain  Rectocele  Cramp and spasm  Muscle weakness (generalized)  Pelvic pain  Rationale for Evaluation and Treatment: Rehabilitation  ONSET DATE: Pt reports that 2 weeks ago, she started having increased right shoulder pain when she was doing plank.  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 Scheduled for surgery next August at [facility], on a wait list. Pt reports that she is frustrated, she also has telescoping intestine, she was told to go to the ER. She has a lot of pain RUQ and left flank pain She reports that is bloated and could not eat yesterday at all, just 2 bites of applesauce. She feels bloated and she is not digesting foods. In significant pain. Feels like dry needling would help today. Shoulder is feeling good.        PERTINENT HISTORY: Pt was a Publishing copy for 12 years.  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  Currently in Pelvic Floor PT at this clinic.  PAIN:  Are you having pain? Yes: NPRS scale: 3/10 Pain location: right shoulder Pain description: burning, sharp Aggravating factors: certain movements Relieving factors: rest  PRECAUTIONS: None  RED FLAGS: None   WEIGHT BEARING RESTRICTIONS: No  FALLS:  Has patient fallen in last 6 months? 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  NEXT MD VISIT: Pending cortisone shot upon insurance approval  OBJECTIVE:  Note: Objective measures were completed at Evaluation unless otherwise noted.  DIAGNOSTIC FINDINGS:  Pt states that the right shoulder radiograph was unremarkable.  Cervical MRI on 08/28/2023: IMPRESSION: MRI scan cervical spine without contrast showing mild disc degenerative change at C3-4 but without significant compression.  PATIENT SURVEYS:  Eval:  UEFS 44 / 80 = 55.0 %  COGNITION: Overall cognitive status: Within functional limits for tasks assessed     SENSATION: Pt reports at times, she gets tingling in her fingers  POSTURE: Rounded shoulders, forward flexed, scoliosis, scapular "winging"  UPPER EXTREMITY ROM:   Active ROM Right eval Left eval  Shoulder flexion 135 162  Shoulder extension    Shoulder abduction 135 165  Shoulder adduction    Shoulder internal rotation To mid thoracic To upper thoracic  Shoulder external rotation To upper thoracic To mid-thoracic  (Blank rows = not tested)  UPPER EXTREMITY MMT:  10/16/2023 (Eval): Right shoulder strength grossly 4 to 4+/5 throughout Left shoulder is [facility]  SHOULDER SPECIAL TESTS: Impingement tests: Hawkins/Kennedy impingement test: positive  Rotator cuff assessment: Empty can test: positive    JOINT MOBILITY TESTING:  Patient with some joint laxity noted    TODAY'S TREATMENT:  DATE:   Pelvic  12/04/2023 Manual- dry needling scar tissue around umbilicus Soft tissue mobilization, abdominal massage for constipation  Electrical stimulation- trial of  trancutaneous tibial nerve stimulation for constipation and pelvic pain- pt educated on protocol and ordering unit from [facility] unless she can preset one at  home     PATIENT EDUCATION: Education details: Issued HEP Person educated: Patient Education method: Programmer, multimedia, Facilities manager, and Handouts Education comprehension: verbalized understanding and returned demonstration  HOME EXERCISE PROGRAM: Access Code: [email or web address] URL: [email or web address] Date: 10/23/2023 Prepared by: Clydie Braun Menke  Exercises - Seated Scapular Retraction  - 1 x daily - 7 x weekly - 2 sets - 10 reps - 5 sec hold - Standing Shoulder Abduction Wall Slide with Thumb Out  - 1 x daily - 7 x weekly - 2 sets - 10 reps - Shoulder Flexion Serratus Activation with Resistance  - 1 x daily - 7 x weekly - 2 sets - 10 reps - Wall Clock with Theraband  - 1 x daily - 7 x weekly - 2 sets - 5 reps - Shoulder External Rotation and Scapular Retraction with Resistance  - 1 x daily - 7 x weekly - 2 sets - 10 reps - Standing Shoulder Horizontal Abduction with Resistance  - 1 x daily - 7 x weekly - 2 sets - 10 reps - Bear Plank from [employer]  - 1 x daily - 7 x weekly - 5 reps - 5 sec hold - Quadruped Pelvic Floor Contraction with Opposite Arm and Leg Lift  - 1 x daily - 7 x weekly - 2 sets - 5-10 reps  ASSESSMENT:  CLINICAL IMPRESSION: Pt did well with abdominal scar needling and massage. She has tight scars and restrictions throughout abdomen. Did well with  trial of TTNS. Was advised to go to ER today d/t pain and inability to pass gas and stool from her GI doctor. Heading there now. Will continue to benefit from PT.   OBJECTIVE IMPAIRMENTS: decreased ROM, decreased strength, increased muscle spasms, impaired UE functional use, postural dysfunction, and pain.   ACTIVITY LIMITATIONS: carrying, lifting, sleeping, and reach over head  PARTICIPATION LIMITATIONS: cleaning, laundry, community activity, occupation, and yard work  PERSONAL FACTORS: Past/current experiences, Time since onset of injury/illness/exacerbation, and 3+ comorbidities: scoliosis, being tested for EDS,  abdominal hysterectomy  are also affecting patient's functional outcome.   REHAB POTENTIAL: Good  CLINICAL DECISION MAKING: Evolving/moderate complexity  EVALUATION COMPLEXITY: Moderate   GOALS: Goals reviewed with patient? Yes  SHORT TERM GOALS: Target date: 11/02/2023  Patient will be independent with initial HEP. Baseline: Goal status: Met on 10/23/23  2.  Patient will demonstrate right shoulder A/ROM to [facility] to allow her to reach overhead without increased pain. Baseline:  Goal status: INITIAL   LONG TERM GOALS: Target date: 12/07/2023  Patient will be independent with advanced HEP. Baseline:  Goal status: INITIAL  2.  Patient will increase Upper Extremity Functional Scale to at least 65% to demonstrate improved functional status. Baseline: 55% Goal status: INITIAL  3.  Patient will increase right shoulder/scapular strength to [facility] to allow her to complete various desired tasks without joint "popping out" or increased pain. Baseline:  Goal status: INITIAL  4.  Patient to report at least 75% improvement to allow her to return to pain-free swimming and exercise. Baseline:  Goal status: INITIAL   PLAN:  PT FREQUENCY: 1-2x/week  PT DURATION: 8 weeks  PLANNED INTERVENTIONS: 97110-Therapeutic exercises, 97530- Therapeutic activity, [id number]- Neuromuscular re-education, 97535- Self  Care, 81191- Manual therapy, [id number]- Aquatic Therapy, 97014- Electrical stimulation (unattended), [id number]- Electrical stimulation (manual), [id number]- Vasopneumatic device, [id number]- Ultrasound, [id number]- Traction (mechanical), Taping, Dry Needling, Joint mobilization, Joint manipulation, Cryotherapy, and Moist heat  PLAN FOR NEXT SESSION: Assess and progress HEP as indicated, strengthening, postural stability, scapular stabilization   Hasan Douse, PT 12/04/23 9:52 AM  [facility] [address], [address] [place], [place] [address] Phone # [phone] Fax [phone]

## 2023-12-04 NOTE — ED Provider Triage Note (Signed)
Emergency Medicine Provider Triage Evaluation Note  Meghan Barry , a 42 y.o. female  was evaluated in triage.  Pt complains of abd pain. Pt recently had MRA done by [facility] that confirms SMAS with intussception and that her pain is from the intussesption. Endorses jelly stools with sausage like sensations in abd. Decreased appetite however this helps with symptoms. Meghan Simpers, MD for GI. Was supposed to be seen by GI however unable to get appt soon and was directed to ED.  Review of Systems  Positive:  Negative:   Physical Exam  BP 115/81 (BP Location: Left Arm)   Pulse 82   Temp 98.4 F (36.9 C) (Oral)   Resp 20   SpO2 100%  Gen:   Awake, no distress   Resp:  Normal effort  MSK:   Moves extremities without difficulty  Other:  Abd tender to palpation with guarding  Medical Decision Making  Medically screening exam initiated at 4:58 PM.  Appropriate orders placed.  Meghan Barry was informed that the remainder of the evaluation will be completed by another provider, this initial triage assessment does not replace that evaluation, and the importance of remaining in the ED until their evaluation is complete.  Workup start, labs and meds ordered, since patient had recent MRA done imaging not ordered at this time as this pain is the same she had when she originally had imaging done. Pt states oxycodone has helped with pain in past but needs zofran with stronger meds. Pt stable at this time.   Meghan Corrigan, PA-C 12/04/23 1701

## 2023-12-05 ENCOUNTER — Other Ambulatory Visit: Payer: Self-pay

## 2023-12-05 DIAGNOSIS — R109 Unspecified abdominal pain: Secondary | ICD-10-CM | POA: Diagnosis present

## 2023-12-05 DIAGNOSIS — E876 Hypokalemia: Secondary | ICD-10-CM

## 2023-12-05 LAB — BASIC METABOLIC PANEL
Anion gap: 12 (ref 5–15)
BUN: 7 mg/dL (ref 6–20)
CO2: 25 mmol/L (ref 22–32)
Calcium: 9.4 mg/dL (ref 8.9–10.3)
Chloride: 103 mmol/L (ref 98–111)
Creatinine, Ser: 0.9 mg/dL (ref 0.44–1.00)
GFR, Estimated: >60 mL/min (ref >60)
Glucose, Bld: 90 mg/dL (ref 70–99)
Potassium: 4.4 mmol/L (ref 3.5–5.1)
Sodium: 140 mmol/L (ref 135–145)

## 2023-12-05 LAB — MAGNESIUM: Magnesium: 2.2 mg/dL (ref 1.7–2.4)

## 2023-12-05 LAB — CBC
HCT: 42 % (ref 36.0–46.0)
Hemoglobin: 14.2 g/dL (ref 12.0–15.0)
MCH: 31.8 pg (ref 26.0–34.0)
MCHC: 33.8 g/dL (ref 30.0–36.0)
MCV: 94 fL (ref 80.0–100.0)
Platelets: 269 10*3/uL (ref 150–400)
RBC: 4.47 MIL/uL (ref 3.87–5.11)
RDW: 12.3 % (ref 11.5–15.5)
WBC: 4.2 10*3/uL (ref 4.0–10.5)
nRBC: 0 % (ref 0.0–0.2)

## 2023-12-05 LAB — HIV ANTIBODY (ROUTINE TESTING W REFLEX): HIV Screen 4th Generation wRfx: NONREACTIVE

## 2023-12-05 MED ORDER — NALOXONE HCL 0.4 MG/ML IJ SOLN: 0.4000 mg | INTRAMUSCULAR | Status: DC | PRN

## 2023-12-05 MED ORDER — VITAMIN B-2 100 MG PO TABS: 100.0000 mg | ORAL_TABLET | Freq: Every day | ORAL | Status: DC

## 2023-12-05 MED ORDER — VITAMIN B-6 100 MG PO TABS
100.0000 mg | ORAL_TABLET | Freq: Every day | ORAL | Status: DC
  Filled 2023-12-05: qty 1

## 2023-12-05 MED ORDER — ADULT MULTIVITAMIN W/MINERALS CH
1.0000 | ORAL_TABLET | Freq: Every day | ORAL | Status: DC
Start: 2023-12-05 — End: 2023-12-06
  Administered 2023-12-05: 1 via ORAL
  Filled 2023-12-05: qty 1

## 2023-12-05 MED ORDER — NIACIN 100 MG PO TABS
100.0000 mg | ORAL_TABLET | Freq: Every day | ORAL | Status: DC
  Filled 2023-12-05: qty 1

## 2023-12-05 MED ORDER — LISDEXAMFETAMINE DIMESYLATE 30 MG PO CAPS: 30.0000 mg | ORAL_CAPSULE | Freq: Every morning | ORAL | Status: DC

## 2023-12-05 MED ORDER — VITAMIN B-12 100 MCG PO TABS
250.0000 ug | ORAL_TABLET | Freq: Every day | ORAL | Status: DC
  Administered 2023-12-05: 250 ug via ORAL
  Filled 2023-12-05: qty 3

## 2023-12-05 MED ORDER — THIAMINE MONONITRATE 100 MG PO TABS
100.0000 mg | ORAL_TABLET | Freq: Every day | ORAL | Status: DC
  Administered 2023-12-05: 100 mg via ORAL
  Filled 2023-12-05: qty 1

## 2023-12-05 MED ORDER — LIDOCAINE HCL URETHRAL/MUCOSAL 2 % EX GEL: 1.0000 | CUTANEOUS | Status: DC | PRN

## 2023-12-05 MED ORDER — POTASSIUM CHLORIDE IN NACL 20-0.9 MEQ/L-% IV SOLN
INTRAVENOUS | Status: DC
  Filled 2023-12-05: qty 1000

## 2023-12-05 MED ORDER — VITAMIN C 500 MG PO TABS
500.0000 mg | ORAL_TABLET | Freq: Every day | ORAL | Status: DC
  Administered 2023-12-05: 500 mg via ORAL
  Filled 2023-12-05: qty 1

## 2023-12-05 MED ORDER — PANTOPRAZOLE SODIUM 40 MG IV SOLR
40.0000 mg | INTRAVENOUS | Status: DC
Start: 2023-12-05 — End: 2023-12-06
  Administered 2023-12-05: 40 mg via INTRAVENOUS
  Filled 2023-12-05: qty 10

## 2023-12-05 MED ORDER — OXYCODONE-ACETAMINOPHEN 5-325 MG PO TABS
1.0000 | ORAL_TABLET | ORAL | Status: DC | PRN
  Administered 2023-12-05: 1 via ORAL
  Filled 2023-12-05: qty 1

## 2023-12-05 MED ORDER — VITAMIN D 25 MCG (1000 UNIT) PO TABS
1000.0000 [IU] | ORAL_TABLET | Freq: Every day | ORAL | Status: DC
  Administered 2023-12-05: 1000 [IU] via ORAL
  Filled 2023-12-05: qty 1

## 2023-12-05 MED ORDER — FERROUS SULFATE 325 (65 FE) MG PO TABS
325.0000 mg | ORAL_TABLET | Freq: Every day | ORAL | Status: DC
  Administered 2023-12-05: 325 mg via ORAL
  Filled 2023-12-05: qty 1

## 2023-12-05 MED ORDER — FENTANYL CITRATE PF 50 MCG/ML IJ SOSY
50.0000 ug | PREFILLED_SYRINGE | INTRAMUSCULAR | Status: DC | PRN
  Administered 2023-12-05: 50 ug via INTRAVENOUS
  Filled 2023-12-05: qty 1

## 2023-12-05 MED ORDER — FLORANEX PO PACK
1.0000 g | PACK | Freq: Three times a day (TID) | ORAL | Status: DC
  Filled 2023-12-05: qty 1

## 2023-12-05 MED ORDER — FENTANYL CITRATE PF 50 MCG/ML IJ SOSY: 25.0000 ug | PREFILLED_SYRINGE | INTRAMUSCULAR | Status: DC | PRN

## 2023-12-05 MED ORDER — KETOROLAC TROMETHAMINE 15 MG/ML IJ SOLN
15.0000 mg | Freq: Four times a day (QID) | INTRAMUSCULAR | Status: DC | PRN
  Administered 2023-12-05: 15 mg via INTRAVENOUS
  Filled 2023-12-05: qty 1

## 2023-12-05 NOTE — Progress Notes (Signed)
Pt left ama. Went over [employer] paper with pt. She understands the risk.

## 2023-12-05 NOTE — Consult Note (Signed)
Consulting Physician: Hyman Hopes Kina Shiffman  Referring Provider: Valrie Hart, PA-C  Chief Complaint: Abdominal pain  Reason for Consult: Possible appendicitis   Subjective   HPI: Meghan Barry is an 42 y.o. female who is here for abdominal pain.  She has chronic abdominal pain has been worked up by gastroenterology including multiple imaging studies over the last few years.  She has had gastric emptying study, CT scans, ultrasounds, MRIs, and upper endoscopy.  She has been diagnosed with SMA syndrome, pelvic floor dysfunction, peptic ulcer disease, and anxiety.  She has had her gallbladder removed previously.  She follows with [facility] gastroenterology.  She often has to massage her bowels to move her bowels.  She presented to hospital because she felt a severe pain near the umbilicus.  It felt like there were just sausages that would not move through the her intestines.  It felt like a gas pain.  She presented to the hospital.  Past Medical History:  Diagnosis Date   Anxiety    Bilateral ovarian cysts    Depression    Insomnia    Stomach ulcer    Venous insufficiency    Venous insufficiency    ablations l leg 2009.,2011    Past Surgical History:  Procedure Laterality Date   ABDOMINAL HYSTERECTOMY  08/2017   anterior posterior vaginal wall repair     BIOPSY  08/01/2018   Procedure: BIOPSY;  Surgeon: Lynann Bologna, MD;  Location: [facility] ENDOSCOPY;  Service: Endoscopy;;   bladder tact  2018   BREAST CYST ASPIRATION Right    CHOLECYSTECTOMY N/A 10/03/2018   Procedure: DIAGNOSTIC LAPAROSCOPY, LAPAROSCOPIC CHOLECYSTECTOMY;  Surgeon: Gaynelle Adu, MD;  Location: [facility] ORS;  Service: General;  Laterality: N/A;   DILATION AND CURETTAGE OF UTERUS     ESOPHAGOGASTRODUODENOSCOPY N/A 08/01/2018   Procedure: ESOPHAGOGASTRODUODENOSCOPY (EGD);  Surgeon: Lynann Bologna, MD;  Location: [facility] ENDOSCOPY;  Service: Endoscopy;  Laterality: N/A;   left ovary removal  2011   OVARIAN CYST SURGERY      TONSILLECTOMY  1988   WRIST SURGERY  11/2015   bone graft    Family History  Problem Relation Age of Onset   Cancer Sister    Breast cancer Maternal Grandmother    Heart attack Mother     Social:  reports that she quit smoking about 9 years ago. Her smoking use included cigarettes. She has never used smokeless tobacco. She reports that she does not drink alcohol and does not use drugs.  Allergies:  Allergies  Allergen Reactions   Bactrim [Sulfamethoxazole-Trimethoprim] Other (See Comments)    Mouth sores, stiff neck and H/A   Ciprofloxacin Hives    flushed face   Codeine Other (See Comments)    Flushed red face, migraines   Dilaudid [Hydromorphone Hcl] Other (See Comments)    migraine   Lunesta [Eszopiclone] Other (See Comments)    hallucinations   Morphine And Codeine Other (See Comments)    Severe migraine    Aripiprazole     Other reaction(s): hallucinations, Other (See Comments)   Duloxetine Hcl     Other reaction(s): psychotic thoughts   Hydrocodone-Acetaminophen     Other reaction(s): Migraines   Amoxicillin Rash    Has patient had a PCN reaction causing immediate rash, facial/tongue/throat swelling, SOB or lightheadedness with hypotension: Yes Has patient had a PCN reaction causing severe rash involving mucus membranes or skin necrosis: No Has patient had a PCN reaction that required hospitalization: No Has patient had a PCN reaction occurring  within the last 10 years: No If all of the above answers are "NO", then may proceed with Cephalosporin use.   Penicillins Rash    Has patient had a PCN reaction causing immediate rash, facial/tongue/throat swelling, SOB or lightheadedness with hypotension: Yes Has patient had a PCN reaction causing severe rash involving mucus membranes or skin necrosis: No Has patient had a PCN reaction that required hospitalization: No Has patient had a PCN reaction occurring within the last 10 years: No If all of the above answers are  "NO", then may proceed with Cephalosporin use.    Medications: Current Outpatient Medications  Medication Instructions   APPLE CIDER VINEGAR PO 1 each, Oral, Daily   ASTRAGALUS PO 1,000 mg, Oral, Daily   cholecalciferol (VITAMIN D3) 1,000 Units, Oral, Daily   Cyanocobalamin (B-12 PO) 1,000 mcg, Oral   DOCOSAHEXAENOIC ACID PO Oral   ferrous sulfate 325 mg, Oral, Daily   Fish Oil 1,000 mg, Oral, Daily   lidocaine (XYLOCAINE) 2 % jelly 1 Application, Urethral, As needed   Multiple Vitamin (MULTIVITAMIN) capsule 1 capsule, Oral, Daily   niacin (TRUE VITAMIN B3) 100 mg, Oral, Daily at bedtime   PANTOTHENIC ACID PO 1 tablet, Oral, Daily   pyridOXINE (VITAMIN B6) 100 mg, Oral, Daily   riboflavin (VITAMIN B-2) 100 mg, Oral, Daily   thiamine (VITAMIN B-1) 100 mg, Oral, Daily   vitamin B-12 (CYANOCOBALAMIN) 250 mcg, Oral, Daily   vitamin C 100 mg, Oral, Daily   Vyvanse 30 mg, Oral, Every morning    ROS - all of the below systems have been reviewed with the patient and positives are indicated with bold text General: chills, fever or night sweats Eyes: blurry vision or double vision ENT: epistaxis or sore throat Allergy/Immunology: itchy/watery eyes or nasal congestion Hematologic/Lymphatic: bleeding problems, blood clots or swollen lymph nodes Endocrine: temperature intolerance or unexpected weight changes Breast: new or changing breast lumps or nipple discharge Resp: cough, shortness of breath, or wheezing CV: chest pain or dyspnea on exertion GI: as per HPI GU: dysuria, trouble voiding, or hematuria MSK: joint pain or joint stiffness Neuro: TIA or stroke symptoms Derm: pruritus and skin lesion changes Psych: anxiety and depression  Objective   PE Blood pressure 107/75, pulse 61, temperature 98.4 F (36.9 C), temperature source Oral, resp. rate 20, SpO2 100%. Constitutional: NAD; conversant; no deformities Eyes: Moist conjunctiva; no lid lag; anicteric; PERRL Neck: Trachea  midline; no thyromegaly Lungs: Normal respiratory effort; no tactile fremitus CV: RRR; no palpable thrills; no pitting edema GI: Abd soft, diffuse tenderness, no rebound guarding or peritoneal signs; no palpable hepatosplenomegaly MSK: Normal range of motion of extremities; no clubbing/cyanosis Psychiatric: Appropriate affect; alert and oriented x3 Lymphatic: No palpable cervical or axillary lymphadenopathy  Results for orders placed or performed during the hospital encounter of 12/04/23 (from the past 24 hour(s))  Lipase, blood     Status: None   Collection Time: 12/04/23  5:29 PM  Result Value Ref Range   Lipase 24 11 - 51 U/L  Comprehensive metabolic panel     Status: Abnormal   Collection Time: 12/04/23  5:29 PM  Result Value Ref Range   Sodium 138 135 - 145 mmol/L   Potassium 3.3 (L) 3.5 - 5.1 mmol/L   Chloride 103 98 - 111 mmol/L   CO2 27 22 - 32 mmol/L   Glucose, Bld 81 70 - 99 mg/dL   BUN 8 6 - 20 mg/dL   Creatinine, Ser 4.16 0.44 - 1.00 mg/dL  Calcium 9.4 8.9 - 10.3 mg/dL   Total Protein 6.9 6.5 - 8.1 g/dL   Albumin 4.2 3.5 - 5.0 g/dL   AST 25 15 - 41 U/L   ALT 22 0 - 44 U/L   Alkaline Phosphatase 35 (L) 38 - 126 U/L   Total Bilirubin 1.2 (H) <1.2 mg/dL   GFR, Estimated >04 >54 mL/min   Anion gap 8 5 - 15  CBC     Status: Abnormal   Collection Time: 12/04/23  5:29 PM  Result Value Ref Range   WBC 3.8 (L) 4.0 - 10.5 K/uL   RBC 4.47 3.87 - 5.11 MIL/uL   Hemoglobin 14.3 12.0 - 15.0 g/dL   HCT 09.8 11.9 - 14.7 %   MCV 92.6 80.0 - 100.0 fL   MCH 32.0 26.0 - 34.0 pg   MCHC 34.5 30.0 - 36.0 g/dL   RDW 82.9 56.2 - 13.0 %   Platelets 290 150 - 400 K/uL   nRBC 0.0 0.0 - 0.2 %  hCG, quantitative, pregnancy     Status: None   Collection Time: 12/04/23  5:29 PM  Result Value Ref Range   hCG, Beta Chain, Quant, S <1 <5 mIU/mL  I-Stat Lactic Acid     Status: None   Collection Time: 12/04/23  5:40 PM  Result Value Ref Range   Lactic Acid, Venous 0.7 0.5 - 1.9 mmol/L   Urinalysis, Routine w reflex microscopic -Urine, Clean Catch     Status: Abnormal   Collection Time: 12/04/23  9:05 PM  Result Value Ref Range   Color, Urine YELLOW YELLOW   APPearance CLEAR CLEAR   Specific Gravity, Urine 1.023 1.005 - 1.030   pH 5.0 5.0 - 8.0   Glucose, UA NEGATIVE NEGATIVE mg/dL   Hgb urine dipstick NEGATIVE NEGATIVE   Bilirubin Urine NEGATIVE NEGATIVE   Ketones, ur 20 (A) NEGATIVE mg/dL   Protein, ur 30 (A) NEGATIVE mg/dL   Nitrite NEGATIVE NEGATIVE   Leukocytes,Ua NEGATIVE NEGATIVE   RBC / HPF 11-20 0 - 5 RBC/hpf   WBC, UA 0-5 0 - 5 WBC/hpf   Bacteria, UA NONE SEEN NONE SEEN   Squamous Epithelial / HPF 0-5 0 - 5 /HPF   Mucus PRESENT   POC occult blood, ED     Status: None   Collection Time: 12/04/23 10:19 PM  Result Value Ref Range   Fecal Occult Bld NEGATIVE NEGATIVE     Imaging Orders         CT Angio Abd/Pel W and/or Wo Contrast     VASCULAR   Aorta and mesenteric vessels widely patent and unremarkable.   NON-VASCULAR   No bowel changes to suggest mesenteric ischemia.   Small amount of fluid adjacent to the appendix. The appendix appears normal and is normal caliber. Recommend clinical correlation to exclude early appendicitis.  Assessment and Plan   Meghan Barry is an 42 y.o. female with a history of chronic abdominal pain issues who presents with acute worsening of her abdominal pain.  Reviewed her labs, vitals, and imaging.  Notably, her white count is 3.8, and the CT scan shows a little bit of fluid around the appendix.  The appendix appears normal according the radiologist.  I do feel the study is limited as it is a CT angiogram.  Her abdominal exam was not convincing that she has acute appendicitis.  I recommend observation with off antibiotics for now to see if her symptoms worsen and become more convincing for  appendicitis.  We may need to repeat a CT scan in the morning depending on how she is feeling.  It may be useful to engage  gastroenterology, possibly after discharge from the hospital, to help determine the cause of her symptoms as they know her well and have been working through different diagnostic studies over the last few years without any clear answers.      ICD-10-CM   1. Generalized abdominal pain  R10.84        Quentin Ore, MD  [facility] Use [email or web address] to contact on call provider  New Patient Billing: [id number] - High MDM

## 2023-12-05 NOTE — Progress Notes (Signed)
42 year old female with history of anxiety, depression, insomnia, venous insufficiency, intermittent introsusception, SMA syndrome, chronic abdominal and pelvic pain syndrome presents with 3 days of worsening abdominal pain.  She had been suffering from chronic abdominal pain and pelvic pain syndrome for years, she has extensive investigation done.  In the emergency room with initial concern about abdominal pain, underwent angiogram of the abdomen pelvis showing widely patent aorta and mesenteric vessels.  No evidence of ischemia.  Small amount of fluid adjacent to the appendix.  Afebrile.  WBC count normal.  Seen by surgery with concern for appendicitis, however ruled out.  Acute on chronic abdominal pain, chronic abdominal and pelvic pain syndrome. Nonsurgical abdomen. Continue IV fluid, pain medications, electrolyte correction.  Advance diet as tolerated.  Mobilize.  Anticipate home tomorrow with outpatient follow-up with her GI.  Seen and examined patient in the emergency room with surgical team at the bedside.  Patient denied any nausea or vomiting.  She has persistent pain that feels like big bubbles/sausagelike swelling in her lower abdomen.   Total time spent: 30 minutes.  No charge visit.

## 2023-12-05 NOTE — ED Notes (Signed)
Informed RN of monitor alerts ?

## 2023-12-05 NOTE — Progress Notes (Signed)
Patient ID: Meghan Barry, female   DOB: 04-03-1981, 42 y.o.   MRN: [id number] [facility] Progress Note     Subjective: CC-  Feels about the same since admission. Continues to have abdominal pain. Currently it is generalized. States that the pain started in the right hemiabdomen but she is more tender in the left. Some nausea, no emesis. No appetite, but states that this is her baseline. Passing flatus, BM yesterday. VSS, afebrile.  Objective: Vital signs in last 24 hours: Temp:  [97.7 F (36.5 C)-98.4 F (36.9 C)] 98.1 F (36.7 C) (12/11 0723) Pulse Rate:  [51-82] 66 (12/11 1000) Resp:  [17-20] 18 (12/11 1000) BP: (93-122)/(57-83) 114/74 (12/11 1000) SpO2:  [100 %] 100 % (12/11 1000)    Intake/Output from previous day: 12/10 0701 - 12/11 0700 In: 50 [IV Piggyback:50] Out: -  Intake/Output this shift: No intake/output data recorded.  PE: Gen:  Alert, NAD Card:  RRR Pulm:  rate and effort normal on room air Abd: soft, ND, mild generalized tenderness more in the left hemiabdomen without rebound or guarding, no peritonitis   Lab Results:  Recent Labs    12/04/23 1729  WBC 3.8*  HGB 14.3  HCT 41.4  PLT 290   BMET Recent Labs    12/04/23 1729  NA 138  K 3.3*  CL 103  CO2 27  GLUCOSE 81  BUN 8  CREATININE 0.91  CALCIUM 9.4   PT/INR No results for input(s): "LABPROT", "INR" in the last 72 hours. CMP     Component Value Date/Time   NA 138 12/04/2023 1729   K 3.3 (L) 12/04/2023 1729   CL 103 12/04/2023 1729   CO2 27 12/04/2023 1729   GLUCOSE 81 12/04/2023 1729   BUN 8 12/04/2023 1729   CREATININE 0.91 12/04/2023 1729   CALCIUM 9.4 12/04/2023 1729   PROT 6.9 12/04/2023 1729   ALBUMIN 4.2 12/04/2023 1729   AST 25 12/04/2023 1729   ALT 22 12/04/2023 1729   ALKPHOS 35 (L) 12/04/2023 1729   BILITOT 1.2 (H) 12/04/2023 1729   GFRNONAA >60 12/04/2023 1729   GFRAA >60 10/03/2018 0448   Lipase     Component Value Date/Time   LIPASE 24  12/04/2023 1729       Studies/Results: CT Angio Abd/Pel W and/or Wo Contrast  Result Date: 12/04/2023 CLINICAL DATA:  Insert history.  Abdominal pain EXAM: CTA ABDOMEN AND PELVIS WITHOUT AND WITH CONTRAST TECHNIQUE: Multidetector CT imaging of the abdomen and pelvis was performed using the standard protocol during bolus administration of intravenous contrast. Multiplanar reconstructed images and MIPs were obtained and reviewed to evaluate the vascular anatomy. RADIATION DOSE REDUCTION: This exam was performed according to the departmental dose-optimization program which includes automated exposure control, adjustment of the mA and/or kV according to patient size and/or use of iterative reconstruction technique. CONTRAST:  75mL OMNIPAQUE IOHEXOL 350 MG/ML SOLN COMPARISON:  10/28/2021 FINDINGS: VASCULAR Aorta: Normal caliber aorta without aneurysm, dissection, vasculitis or significant stenosis. Celiac: Patent without evidence of aneurysm, dissection, vasculitis or significant stenosis. SMA: Patent without evidence of aneurysm, dissection, vasculitis or significant stenosis. Renals: Both renal arteries are patent without evidence of aneurysm, dissection, vasculitis, fibromuscular dysplasia or significant stenosis. IMA: Patent without evidence of aneurysm, dissection, vasculitis or significant stenosis. Inflow: Patent without evidence of aneurysm, dissection, vasculitis or significant stenosis. Proximal Outflow: Bilateral common femoral and visualized portions of the superficial and profunda femoral arteries are patent without evidence of aneurysm, dissection, vasculitis or significant stenosis.  Veins: No obvious venous abnormality within the limitations of this arterial phase study. Review of the MIP images confirms the above findings. NON-VASCULAR Lower chest: No acute abnormality. Hepatobiliary: No focal liver abnormality is seen. Status post cholecystectomy. No biliary dilatation. Pancreas: No focal  abnormality or ductal dilatation. Spleen: No focal abnormality.  Normal size. Adrenals/Urinary Tract: No adrenal abnormality. No focal renal abnormality. No stones or hydronephrosis. Urinary bladder is unremarkable. Stomach/Bowel: Stomach, large and small bowel grossly unremarkable. Appendix is normal caliber measuring 3-4 mm. Small amount of fluid adjacent to the appendix. Lymphatic: No adenopathy Reproductive: Prior hysterectomy.  No adnexal masses. Other: Trace fluid adjacent to the appendix in the right lower quadrant. Musculoskeletal: No acute bony abnormality. IMPRESSION: VASCULAR Aorta and mesenteric vessels widely patent and unremarkable. NON-VASCULAR No bowel changes to suggest mesenteric ischemia. Small amount of fluid adjacent to the appendix. The appendix appears normal and is normal caliber. Recommend clinical correlation to exclude early appendicitis. Electronically Signed   By: Charlett Nose M.D.   On: 12/04/2023 21:50    Anti-infectives: Anti-infectives (From admission, onward)    None        Assessment/Plan Chronic abdominal pain ?Appendicitis - CTA reports normal appearing appendix with a little surrounding fluid. Abdominal exam reassuring and history is not consistent with appendicitis. Ok for clear liquids. If patient were to worsen could consider CT a/p to better evaluate the appendix, but low suspicion for appendicitis at this time. Consider GI consult versus outpatient follow up.  We will sign off, but please call with any questions or concerns.   ID - none FEN - CLD VTE - SCDs, ok for chemical dvt ppx from surgical standpoint Foley - none   I reviewed last 24 h vitals and pain scores, last 48 h intake and output, last 24 h labs and trends, and last 24 h imaging results.    LOS: 0 days    Franne Forts, [facility] 12/05/2023, 10:07 AM Please see Amion for pager number during day hours 7:00am-4:30pm

## 2023-12-05 NOTE — H&P (Signed)
History and Physical    Meghan Barry [facility] MVH:[id number] DOB: 17-Dec-1981 DOA: 12/04/2023  PCP: Henrine Screws, MD  Patient coming from: Home  Chief Complaint: Abdominal pain  HPI: Meghan Barry is a 42 y.o. female with medical history significant of anxiety, depression, insomnia, bilateral ovarian cysts, stomach ulcer, venous insufficiency, neurocardiogenic syncope, intermittent intussusception, SMA syndrome, history of cholecystectomy presented to the ED with complaints of abdominal pain, nausea, and dark/bloody jelly stool.  CTA abdomen pelvis showing widely patent aorta and mesenteric vessels, no evidence of mesenteric ischemia.  Does show small amount of fluid adjacent to the appendix but appendix appears normal in caliber, findings concerning for possible early appendicitis.  Afebrile.  Labs notable for WBC count 3.8, hemoglobin 14.3, FOBT negative, potassium 3.3, creatinine 0.9, AST 25, ALT 22, alk phos 35, T. bili 1.2, lipase 24, beta-hCG <1, lactic acid 0.7, UA not suggestive of infection. Patient was evaluated by general surgery who felt that her abdominal exam was not convincing for acute appendicitis.  Recommended observation without antibiotics for now to see if her symptoms worsen and become more convincing for appendicitis.  Patient received fentanyl, Zofran, Percocet, and IV Pepcid in the ED.  Patient reports history of chronic abdominal pain for which she is seen by GI and vascular surgery.  For the past 2 days she is having worsening abdominal pain and nausea.  She has been massaging her abdomen and states it feels like there is a sausage inside on the right side of her abdomen.  Pain started in the right lower quadrant but now generalized.  Denies fevers.  Denies any episodes of vomiting.  Yesterday morning she had a bowel movement and the stool appeared dark and jellylike.  Review of Systems:  Review of Systems  All other systems reviewed and are negative.   Past  Medical History:  Diagnosis Date   Anxiety    Bilateral ovarian cysts    Depression    Insomnia    Stomach ulcer    Venous insufficiency    Venous insufficiency    ablations l leg 2009.,2011    Past Surgical History:  Procedure Laterality Date   ABDOMINAL HYSTERECTOMY  08/2017   anterior posterior vaginal wall repair     BIOPSY  08/01/2018   Procedure: BIOPSY;  Surgeon: Lynann Bologna, MD;  Location: [facility] ENDOSCOPY;  Service: Endoscopy;;   bladder tact  2018   BREAST CYST ASPIRATION Right    CHOLECYSTECTOMY N/A 10/03/2018   Procedure: DIAGNOSTIC LAPAROSCOPY, LAPAROSCOPIC CHOLECYSTECTOMY;  Surgeon: Gaynelle Adu, MD;  Location: [facility] ORS;  Service: General;  Laterality: N/A;   DILATION AND CURETTAGE OF UTERUS     ESOPHAGOGASTRODUODENOSCOPY N/A 08/01/2018   Procedure: ESOPHAGOGASTRODUODENOSCOPY (EGD);  Surgeon: Lynann Bologna, MD;  Location: [facility] ENDOSCOPY;  Service: Endoscopy;  Laterality: N/A;   left ovary removal  2011   OVARIAN CYST SURGERY     TONSILLECTOMY  1988   WRIST SURGERY  11/2015   bone graft     reports that she quit smoking about 9 years ago. Her smoking use included cigarettes. She has never used smokeless tobacco. She reports that she does not drink alcohol and does not use drugs.  Allergies  Allergen Reactions   Bactrim [Sulfamethoxazole-Trimethoprim] Other (See Comments)    Mouth sores, stiff neck and H/A   Ciprofloxacin Hives    flushed face   Codeine Other (See Comments)    Flushed red face, migraines   Dilaudid [Hydromorphone Hcl] Other (See Comments)    migraine  Lunesta [Eszopiclone] Other (See Comments)    hallucinations   Morphine And Codeine Other (See Comments)    Severe migraine    Aripiprazole     Other reaction(s): hallucinations, Other (See Comments)   Duloxetine Hcl     Other reaction(s): psychotic thoughts   Hydrocodone-Acetaminophen     Other reaction(s): Migraines   Amoxicillin Rash    Has patient had a PCN reaction causing immediate  rash, facial/tongue/throat swelling, SOB or lightheadedness with hypotension: Yes Has patient had a PCN reaction causing severe rash involving mucus membranes or skin necrosis: No Has patient had a PCN reaction that required hospitalization: No Has patient had a PCN reaction occurring within the last 10 years: No If all of the above answers are "NO", then may proceed with Cephalosporin use.   Penicillins Rash    Has patient had a PCN reaction causing immediate rash, facial/tongue/throat swelling, SOB or lightheadedness with hypotension: Yes Has patient had a PCN reaction causing severe rash involving mucus membranes or skin necrosis: No Has patient had a PCN reaction that required hospitalization: No Has patient had a PCN reaction occurring within the last 10 years: No If all of the above answers are "NO", then may proceed with Cephalosporin use.    Family History  Problem Relation Age of Onset   Cancer Sister    Breast cancer Maternal Grandmother    Heart attack Mother     Prior to Admission medications   Medication Sig Start Date End Date Taking? Authorizing Provider  APPLE CIDER VINEGAR PO Take 1 each by mouth daily.   Yes [provider]  Ascorbic Acid (VITAMIN C) 100 MG tablet Take 100 mg by mouth daily.   Yes [provider]  ASTRAGALUS PO Take 1,000 mg by mouth daily.   Yes [provider]  cholecalciferol (VITAMIN D3) 25 MCG (1000 UNIT) tablet Take 1,000 Units by mouth daily.   Yes [provider]  Cyanocobalamin (B-12 PO) Take 1,000 mcg by mouth.   Yes [provider]  DOCOSAHEXAENOIC ACID PO Take by mouth.   Yes [provider]  ferrous sulfate 325 (65 FE) MG EC tablet Take 325 mg by mouth daily.   Yes [provider]  lidocaine (XYLOCAINE) 2 % jelly Place 1 Application into the urethra as needed.   Yes [provider]  Multiple Vitamin (MULTIVITAMIN) capsule Take 1 capsule by mouth daily.   Yes [provider]  niacin (TRUE VITAMIN B3) 100 MG tablet Take 100 mg by mouth at bedtime.   Yes [provider]  Omega-3 Fatty Acids (FISH OIL) 1000 MG CAPS Take 1,000 mg by mouth daily.   Yes [provider]  PANTOTHENIC ACID PO Take 1 tablet by mouth daily.   Yes [provider]  pyridOXINE (VITAMIN B6) 100 MG tablet Take 100 mg by mouth daily.   Yes [provider]  riboflavin (VITAMIN B-2) 100 MG TABS tablet Take 100 mg by mouth daily.   Yes [provider]  thiamine (VITAMIN B-1) 100 MG tablet Take 100 mg by mouth daily.   Yes [provider]  vitamin B-12 (CYANOCOBALAMIN) 250 MCG tablet Take 250 mcg by mouth daily.   Yes [provider]  VYVANSE 30 MG capsule Take 30 mg by mouth every morning. 10/05/20  Yes [provider]    Physical Exam: Vitals:   12/04/23 2100 12/05/23 0221 12/05/23 0230 12/05/23 0400  BP: 107/75 97/73 103/64 (!) 93/57  Pulse: 61 (!)  58 (!) 52 (!) 51  Resp:  17    Temp:  97.7 F (36.5 C)    TempSrc:  Oral    SpO2: 100% 100% 100% 100%    Physical Exam Vitals reviewed.  Constitutional:      General: She is not in acute distress. HENT:     Head: Normocephalic and atraumatic.  Eyes:     Extraocular Movements: Extraocular movements intact.  Cardiovascular:     Rate and Rhythm: Normal rate and regular rhythm.     Pulses: Normal pulses.  Pulmonary:     Effort: Pulmonary effort is normal. No respiratory distress.     Breath sounds: Normal breath sounds. No wheezing or rales.  Abdominal:     General: Bowel sounds are normal. There is no distension.     Palpations: Abdomen is soft.     Tenderness: There is abdominal tenderness. There is no guarding or rebound.     Comments: Generalized tenderness to palpation  Musculoskeletal:     Cervical back: Normal range of motion.     Right lower leg: No edema.     Left lower leg: No edema.  Skin:    General: Skin is warm and dry.  Neurological:      General: No focal deficit present.     Mental Status: She is alert and oriented to person, place, and time.     Labs on Admission: I have personally reviewed following labs and imaging studies  CBC: Recent Labs  Lab 12/04/23 1729  WBC 3.8*  HGB 14.3  HCT 41.4  MCV 92.6  PLT 290   Basic Metabolic Panel: Recent Labs  Lab 12/04/23 1729  NA 138  K 3.3*  CL 103  CO2 27  GLUCOSE 81  BUN 8  CREATININE 0.91  CALCIUM 9.4   GFR: CrCl cannot be calculated (Unknown ideal weight.). Liver Function Tests: Recent Labs  Lab 12/04/23 1729  AST 25  ALT 22  ALKPHOS 35*  BILITOT 1.2*  PROT 6.9  ALBUMIN 4.2   Recent Labs  Lab 12/04/23 1729  LIPASE 24   No results for input(s): "AMMONIA" in the last 168 hours. Coagulation Profile: No results for input(s): "INR", "PROTIME" in the last 168 hours. Cardiac Enzymes: No results for input(s): "CKTOTAL", "CKMB", "CKMBINDEX", "TROPONINI" in the last 168 hours. BNP (last 3 results) No results for input(s): "PROBNP" in the last 8760 hours. HbA1C: No results for input(s): "HGBA1C" in the last 72 hours. CBG: No results for input(s): "GLUCAP" in the last 168 hours. Lipid Profile: No results for input(s): "CHOL", "HDL", "LDLCALC", "TRIG", "CHOLHDL", "LDLDIRECT" in the last 72 hours. Thyroid Function Tests: No results for input(s): "TSH", "T4TOTAL", "FREET4", "T3FREE", "THYROIDAB" in the last 72 hours. Anemia Panel: No results for input(s): "VITAMINB12", "FOLATE", "FERRITIN", "TIBC", "IRON", "RETICCTPCT" in the last 72 hours. Urine analysis:    Component Value Date/Time   COLORURINE YELLOW 12/04/2023 2105   APPEARANCEUR CLEAR 12/04/2023 2105   LABSPEC 1.023 12/04/2023 2105   PHURINE 5.0 12/04/2023 2105   GLUCOSEU NEGATIVE 12/04/2023 2105   HGBUR NEGATIVE 12/04/2023 2105   BILIRUBINUR NEGATIVE 12/04/2023 2105   KETONESUR 20 (A) 12/04/2023 2105   PROTEINUR 30 (A) 12/04/2023 2105   NITRITE NEGATIVE 12/04/2023 2105    LEUKOCYTESUR NEGATIVE 12/04/2023 2105    Radiological Exams on Admission: CT Angio Abd/Pel W and/or Wo Contrast  Result Date: 12/04/2023 CLINICAL DATA:  Insert history.  Abdominal pain EXAM: CTA ABDOMEN AND PELVIS WITHOUT AND WITH CONTRAST TECHNIQUE: Multidetector  CT imaging of the abdomen and pelvis was performed using the standard protocol during bolus administration of intravenous contrast. Multiplanar reconstructed images and MIPs were obtained and reviewed to evaluate the vascular anatomy. RADIATION DOSE REDUCTION: This exam was performed according to the departmental dose-optimization program which includes automated exposure control, adjustment of the mA and/or kV according to patient size and/or use of iterative reconstruction technique. CONTRAST:  75mL OMNIPAQUE IOHEXOL 350 MG/ML SOLN COMPARISON:  10/28/2021 FINDINGS: VASCULAR Aorta: Normal caliber aorta without aneurysm, dissection, vasculitis or significant stenosis. Celiac: Patent without evidence of aneurysm, dissection, vasculitis or significant stenosis. SMA: Patent without evidence of aneurysm, dissection, vasculitis or significant stenosis. Renals: Both renal arteries are patent without evidence of aneurysm, dissection, vasculitis, fibromuscular dysplasia or significant stenosis. IMA: Patent without evidence of aneurysm, dissection, vasculitis or significant stenosis. Inflow: Patent without evidence of aneurysm, dissection, vasculitis or significant stenosis. Proximal Outflow: Bilateral common femoral and visualized portions of the superficial and profunda femoral arteries are patent without evidence of aneurysm, dissection, vasculitis or significant stenosis. Veins: No obvious venous abnormality within the limitations of this arterial phase study. Review of the MIP images confirms the above findings. NON-VASCULAR Lower chest: No acute abnormality. Hepatobiliary: No focal liver abnormality is seen. Status post cholecystectomy. No biliary  dilatation. Pancreas: No focal abnormality or ductal dilatation. Spleen: No focal abnormality.  Normal size. Adrenals/Urinary Tract: No adrenal abnormality. No focal renal abnormality. No stones or hydronephrosis. Urinary bladder is unremarkable. Stomach/Bowel: Stomach, large and small bowel grossly unremarkable. Appendix is normal caliber measuring 3-4 mm. Small amount of fluid adjacent to the appendix. Lymphatic: No adenopathy Reproductive: Prior hysterectomy.  No adnexal masses. Other: Trace fluid adjacent to the appendix in the right lower quadrant. Musculoskeletal: No acute bony abnormality. IMPRESSION: VASCULAR Aorta and mesenteric vessels widely patent and unremarkable. NON-VASCULAR No bowel changes to suggest mesenteric ischemia. Small amount of fluid adjacent to the appendix. The appendix appears normal and is normal caliber. Recommend clinical correlation to exclude early appendicitis. Electronically Signed   By: Charlett Nose M.D.   On: 12/04/2023 21:50    Assessment and Plan  Acute on chronic abdominal pain ?Acute appendicitis CTA abdomen pelvis showing widely patent aorta and mesenteric vessels, no evidence of mesenteric ischemia.  Does show small amount of fluid adjacent to the appendix but appendix appears normal in caliber, findings concerning for possible early appendicitis.  No fever or leukocytosis.  Lactate normal. Patient was evaluated by general surgery who felt that her abdominal exam was not convincing for acute appendicitis.  Recommended observation without antibiotics for now to see if her symptoms worsen and become more convincing for appendicitis.  Repeat CBC ordered.  May need repeat abdominal CT in the morning, will defer to general surgery.  Continue pain management, antiemetic as needed (EKG ordered to check QT interval).  Keep n.p.o. at this time, IV fluid hydration.  Mild hypokalemia Replace potassium and monitor labs.  Also check magnesium level.  Anxiety and  depression Continue home medication when no longer n.p.o.  History of PUD IV Protonix.  DVT prophylaxis: SCDs Code Status: Full Code (discussed with the patient) Consults called: General Surgery Level of care: Med-Surg Admission status: It is my clinical opinion that referral for OBSERVATION is reasonable and necessary in this patient based on the above information provided. The aforementioned taken together are felt to place the patient at high risk for further clinical deterioration. However, it is anticipated that the patient may be medically stable for discharge from the  hospital within 24 to 48 hours.  John Giovanni MD [facility]  If 7PM-7AM, please contact night-coverage [email or web address]  12/05/2023, 6:27 AM

## 2023-12-05 NOTE — ED Notes (Signed)
ED TO INPATIENT HANDOFF REPORT  ED Nurse Name and Phone #: Murlean Iba Paramedic [phone]  S Name/Age/Gender Meghan Barry 42 y.o. female Room/Bed: 030C/030C  Code Status   Code Status: Full Code  Home/SNF/Other Home Patient oriented to: self, place, time, and situation Is this baseline? Yes   Triage Complete: Triage complete  Chief Complaint Acute abdominal pain [R10.9]  Triage Note Pt is coming in with insusceptible of her bowels, this is a known issue for her in which she can usually manipulate her bowels to relieve the pain but it is not working today, she has had recent MR scans done at [facility] 3 weeks ago to confirm Dx, they are working on surgery for her but today she started having the worsening pain today.    Allergies Allergies  Allergen Reactions   Bactrim [Sulfamethoxazole-Trimethoprim] Other (See Comments)    Mouth sores, stiff neck and H/A   Ciprofloxacin Hives    flushed face   Codeine Other (See Comments)    Flushed red face, migraines   Dilaudid [Hydromorphone Hcl] Other (See Comments)    migraine   Lunesta [Eszopiclone] Other (See Comments)    hallucinations   Morphine And Codeine Other (See Comments)    Severe migraine    Aripiprazole     Other reaction(s): hallucinations, Other (See Comments)   Duloxetine Hcl     Other reaction(s): psychotic thoughts   Hydrocodone-Acetaminophen     Other reaction(s): Migraines   Amoxicillin Rash    Has patient had a PCN reaction causing immediate rash, facial/tongue/throat swelling, SOB or lightheadedness with hypotension: Yes Has patient had a PCN reaction causing severe rash involving mucus membranes or skin necrosis: No Has patient had a PCN reaction that required hospitalization: No Has patient had a PCN reaction occurring within the last 10 years: No If all of the above answers are "NO", then may proceed with Cephalosporin use.   Penicillins Rash    Has patient had a PCN reaction causing immediate rash,  facial/tongue/throat swelling, SOB or lightheadedness with hypotension: Yes Has patient had a PCN reaction causing severe rash involving mucus membranes or skin necrosis: No Has patient had a PCN reaction that required hospitalization: No Has patient had a PCN reaction occurring within the last 10 years: No If all of the above answers are "NO", then may proceed with Cephalosporin use.    Level of Care/Admitting Diagnosis ED Disposition     ED Disposition  Admit   Condition  --   Comment  Hospital Area[facility] [[id number]]  Level of Care: Med-Surg [16]  May place patient in observation at [facility] or [facility] if equivalent level of care is available:: No  Covid Evaluation: Asymptomatic - no recent exposure (last 10 days) testing not required  Diagnosis: Acute abdominal pain [[id number]]  Admitting Physician: Burnadette Pop [[id number]]  Attending Physician: Burnadette Pop [[id number]]          B Medical/Surgery History Past Medical History:  Diagnosis Date   Anxiety    Bilateral ovarian cysts    Depression    Insomnia    Stomach ulcer    Venous insufficiency    Venous insufficiency    ablations l leg 2009.,2011   Past Surgical History:  Procedure Laterality Date   ABDOMINAL HYSTERECTOMY  08/2017   anterior posterior vaginal wall repair     BIOPSY  08/01/2018   Procedure: BIOPSY;  Surgeon: Lynann Bologna, MD;  Location: [facility] ENDOSCOPY;  Service: Endoscopy;;  bladder tact  2018   BREAST CYST ASPIRATION Right    CHOLECYSTECTOMY N/A 10/03/2018   Procedure: DIAGNOSTIC LAPAROSCOPY, LAPAROSCOPIC CHOLECYSTECTOMY;  Surgeon: Gaynelle Adu, MD;  Location: [facility] ORS;  Service: General;  Laterality: N/A;   DILATION AND CURETTAGE OF UTERUS     ESOPHAGOGASTRODUODENOSCOPY N/A 08/01/2018   Procedure: ESOPHAGOGASTRODUODENOSCOPY (EGD);  Surgeon: Lynann Bologna, MD;  Location: [facility] ENDOSCOPY;  Service: Endoscopy;  Laterality: N/A;   left ovary removal  2011   OVARIAN CYST  SURGERY     TONSILLECTOMY  1988   WRIST SURGERY  11/2015   bone graft     A IV Location/Drains/Wounds Patient Lines/Drains/Airways Status     Active Line/Drains/Airways     Name Placement date Placement time Site Days   Peripheral IV 12/04/23 Left Antecubital 12/04/23  1724  Antecubital  1   Incision - 4 Ports Abdomen Right;Lateral Right;Medial Umbilicus Left 10/03/18  1350  -- 1889            Intake/Output Last 24 hours  Intake/Output Summary (Last 24 hours) at 12/05/2023 1109 Last data filed at 12/04/2023 2357 Gross per 24 hour  Intake 50 ml  Output --  Net 50 ml    Labs/Imaging Results for orders placed or performed during the hospital encounter of 12/04/23 (from the past 48 hour(s))  Lipase, blood     Status: None   Collection Time: 12/04/23  5:29 PM  Result Value Ref Range   Lipase 24 11 - 51 U/L    Comment: Performed at [facility], [address] [place], [place] [address]  Comprehensive metabolic panel     Status: Abnormal   Collection Time: 12/04/23  5:29 PM  Result Value Ref Range   Sodium 138 135 - 145 mmol/L   Potassium 3.3 (L) 3.5 - 5.1 mmol/L   Chloride 103 98 - 111 mmol/L   CO2 27 22 - 32 mmol/L   Glucose, Bld 81 70 - 99 mg/dL    Comment: Glucose reference range applies only to samples taken after fasting for at least 8 hours.   BUN 8 6 - 20 mg/dL   Creatinine, Ser 6.04 0.44 - 1.00 mg/dL   Calcium 9.4 8.9 - 54.0 mg/dL   Total Protein 6.9 6.5 - 8.1 g/dL   Albumin 4.2 3.5 - 5.0 g/dL   AST 25 15 - 41 U/L   ALT 22 0 - 44 U/L   Alkaline Phosphatase 35 (L) 38 - 126 U/L   Total Bilirubin 1.2 (H) <1.2 mg/dL   GFR, Estimated >98 >11 mL/min    Comment: (NOTE) Calculated using the CKD-EPI Creatinine Equation (2021)    Anion gap 8 5 - 15    Comment: Performed at [facility], [address], [place], [place] [address]  CBC     Status: Abnormal   Collection Time: 12/04/23  5:29 PM  Result Value Ref Range   WBC 3.8 (L) 4.0 - 10.5 K/uL    RBC 4.47 3.87 - 5.11 MIL/uL   Hemoglobin 14.3 12.0 - 15.0 g/dL   HCT 29.5 62.1 - 30.8 %   MCV 92.6 80.0 - 100.0 fL   MCH 32.0 26.0 - 34.0 pg   MCHC 34.5 30.0 - 36.0 g/dL   RDW 65.7 84.6 - 96.2 %   Platelets 290 150 - 400 K/uL   nRBC 0.0 0.0 - 0.2 %    Comment: Performed at [facility], [address] [place], [place] [address]  hCG,  quantitative, pregnancy     Status: None   Collection Time: 12/04/23  5:29 PM  Result Value Ref Range   hCG, Beta Chain, Quant, S <1 <5 mIU/mL    Comment:          GEST. AGE      CONC.  (mIU/mL)   <=1 WEEK        5 - 50     2 WEEKS       50 - 500     3 WEEKS       100 - 10,000     4 WEEKS     1,000 - 30,000     5 WEEKS     3,500 - 115,000   6-8 WEEKS     12,000 - 270,000    12 WEEKS     15,000 - 220,000        FEMALE AND NON-PREGNANT FEMALE:     LESS THAN 5 mIU/mL Performed at [facility], [address], [place], [place] [address]   I-Stat Lactic Acid     Status: None   Collection Time: 12/04/23  5:40 PM  Result Value Ref Range   Lactic Acid, Venous 0.7 0.5 - 1.9 mmol/L  Urinalysis, Routine w reflex microscopic -Urine, Clean Catch     Status: Abnormal   Collection Time: 12/04/23  9:05 PM  Result Value Ref Range   Color, Urine YELLOW YELLOW   APPearance CLEAR CLEAR   Specific Gravity, Urine 1.023 1.005 - 1.030   pH 5.0 5.0 - 8.0   Glucose, UA NEGATIVE NEGATIVE mg/dL   Hgb urine dipstick NEGATIVE NEGATIVE   Bilirubin Urine NEGATIVE NEGATIVE   Ketones, ur 20 (A) NEGATIVE mg/dL   Protein, ur 30 (A) NEGATIVE mg/dL   Nitrite NEGATIVE NEGATIVE   Leukocytes,Ua NEGATIVE NEGATIVE   RBC / HPF 11-20 0 - 5 RBC/hpf   WBC, UA 0-5 0 - 5 WBC/hpf   Bacteria, UA NONE SEEN NONE SEEN   Squamous Epithelial / HPF 0-5 0 - 5 /HPF   Mucus PRESENT     Comment: Performed at [facility], [address] [place], [place] [address]  POC occult blood, ED     Status: None   Collection Time: 12/04/23 10:19 PM  Result Value Ref Range   Fecal  Occult Bld NEGATIVE NEGATIVE   CT Angio Abd/Pel W and/or Wo Contrast  Result Date: 12/04/2023 CLINICAL DATA:  Insert history.  Abdominal pain EXAM: CTA ABDOMEN AND PELVIS WITHOUT AND WITH CONTRAST TECHNIQUE: Multidetector CT imaging of the abdomen and pelvis was performed using the standard protocol during bolus administration of intravenous contrast. Multiplanar reconstructed images and MIPs were obtained and reviewed to evaluate the vascular anatomy. RADIATION DOSE REDUCTION: This exam was performed according to the departmental dose-optimization program which includes automated exposure control, adjustment of the mA and/or kV according to patient size and/or use of iterative reconstruction technique. CONTRAST:  75mL OMNIPAQUE IOHEXOL 350 MG/ML SOLN COMPARISON:  10/28/2021 FINDINGS: VASCULAR Aorta: Normal caliber aorta without aneurysm, dissection, vasculitis or significant stenosis. Celiac: Patent without evidence of aneurysm, dissection, vasculitis or significant stenosis. SMA: Patent without evidence of aneurysm, dissection, vasculitis or significant stenosis. Renals: Both renal arteries are patent without evidence of aneurysm, dissection, vasculitis, fibromuscular dysplasia or significant stenosis. IMA: Patent without evidence of aneurysm, dissection, vasculitis or significant stenosis. Inflow: Patent without evidence of aneurysm, dissection, vasculitis or significant stenosis. Proximal Outflow: Bilateral common femoral and visualized portions of the superficial  and profunda femoral arteries are patent without evidence of aneurysm, dissection, vasculitis or significant stenosis. Veins: No obvious venous abnormality within the limitations of this arterial phase study. Review of the MIP images confirms the above findings. NON-VASCULAR Lower chest: No acute abnormality. Hepatobiliary: No focal liver abnormality is seen. Status post cholecystectomy. No biliary dilatation. Pancreas: No focal abnormality or  ductal dilatation. Spleen: No focal abnormality.  Normal size. Adrenals/Urinary Tract: No adrenal abnormality. No focal renal abnormality. No stones or hydronephrosis. Urinary bladder is unremarkable. Stomach/Bowel: Stomach, large and small bowel grossly unremarkable. Appendix is normal caliber measuring 3-4 mm. Small amount of fluid adjacent to the appendix. Lymphatic: No adenopathy Reproductive: Prior hysterectomy.  No adnexal masses. Other: Trace fluid adjacent to the appendix in the right lower quadrant. Musculoskeletal: No acute bony abnormality. IMPRESSION: VASCULAR Aorta and mesenteric vessels widely patent and unremarkable. NON-VASCULAR No bowel changes to suggest mesenteric ischemia. Small amount of fluid adjacent to the appendix. The appendix appears normal and is normal caliber. Recommend clinical correlation to exclude early appendicitis. Electronically Signed   By: Charlett Nose M.D.   On: 12/04/2023 21:50    Pending Labs Unresulted Labs (From admission, onward)     Start     Ordered   12/05/23 0708  Magnesium  Once,   R        12/05/23 0707   12/05/23 0707  HIV Antibody (routine testing w rflx)  (HIV Antibody (Routine testing w reflex) panel)  Once,   R        12/05/23 0707   12/05/23 0702  CBC  Once,   R        12/05/23 0702   12/05/23 0702  Basic metabolic panel  Once,   R        12/05/23 0702            Vitals/Pain Today's Vitals   12/05/23 0723 12/05/23 1000 12/05/23 1028 12/05/23 1047  BP:  114/74    Pulse:  66    Resp:  18    Temp: 98.1 F (36.7 C)     TempSrc: Oral     SpO2:  100%    Weight:    130 lb (59 kg)  Height:    5\' 9"  (1.753 m)  PainSc:   10-Worst pain ever     Isolation Precautions No active isolations  Medications Medications  ondansetron (ZOFRAN) injection 4 mg (4 mg Intravenous Given 12/05/23 0341)  0.9 % NaCl with KCl 20 mEq/ L  infusion ( Intravenous New Bag/Given 12/05/23 1003)  pantoprazole (PROTONIX) injection 40 mg (40 mg Intravenous  Given 12/05/23 1004)  fentaNYL (SUBLIMAZE) injection 25 mcg (has no administration in time range)  naloxone (NARCAN) injection 0.4 mg (has no administration in time range)  lidocaine (XYLOCAINE) 2 % jelly 1 Application (has no administration in time range)  ferrous sulfate tablet 325 mg (325 mg Oral Given 12/05/23 1029)  vitamin B-12 (CYANOCOBALAMIN) tablet 250 mcg (250 mcg Oral Given 12/05/23 1029)  multivitamin with minerals tablet 1 tablet (1 tablet Oral Given 12/05/23 1029)  ascorbic acid (VITAMIN C) tablet 500 mg (500 mg Oral Given 12/05/23 1029)  cholecalciferol (VITAMIN D3) 25 MCG (1000 UNIT) tablet 1,000 Units (1,000 Units Oral Given 12/05/23 1029)  niacin (VITAMIN B3) tablet 100 mg (has no administration in time range)  pyridOXINE (VITAMIN B6) tablet 100 mg (has no administration in time range)  thiamine (VITAMIN B1) tablet 100 mg (100 mg Oral Given 12/05/23 1029)  oxyCODONE-acetaminophen (PERCOCET/ROXICET) 5-325  MG per tablet 1 tablet (1 tablet Oral Given 12/05/23 1030)  oxyCODONE-acetaminophen (PERCOCET/ROXICET) 5-325 MG per tablet 1 tablet (1 tablet Oral Given 12/04/23 1719)  ondansetron (ZOFRAN-ODT) disintegrating tablet 4 mg (4 mg Oral Given 12/04/23 1719)  iohexol (OMNIPAQUE) 350 MG/ML injection 75 mL (75 mLs Intravenous Contrast Given 12/04/23 2134)  fentaNYL (SUBLIMAZE) injection 50 mcg (50 mcg Intravenous Given 12/04/23 2300)  famotidine (PEPCID) IVPB 20 mg premix (0 mg Intravenous Stopped 12/04/23 2357)    Mobility walks     Focused Assessments GI   R Recommendations: See Admitting Provider Note  Report given to:   Additional Notes: Acute on chronic GI flare up.

## 2023-12-06 NOTE — Discharge Summary (Signed)
Patient was admitted with acute on chronic abdominal pain, chronic abdominal and pelvic pain syndrome.  Seen by surgery.  Did not recommend any surgical intervention.  She was on symptomatic management with IV fluids and pain medications.  Patient decided to leave late evening to keep up her outpatient follow-ups.  She left AGAINST MEDICAL ADVICE.  Patient was stable and able to make her decisions.   No charge.

## 2023-12-11 ENCOUNTER — Ambulatory Visit: Admitting: Physical Therapy

## 2023-12-11 ENCOUNTER — Encounter: Payer: Self-pay | Admitting: Physical Therapy

## 2023-12-11 DIAGNOSIS — R252 Cramp and spasm: Secondary | ICD-10-CM

## 2023-12-11 DIAGNOSIS — R102 Pelvic and perineal pain unspecified side: Secondary | ICD-10-CM

## 2023-12-11 DIAGNOSIS — N816 Rectocele: Secondary | ICD-10-CM

## 2023-12-11 DIAGNOSIS — M25511 Pain in right shoulder: Secondary | ICD-10-CM

## 2023-12-11 DIAGNOSIS — M6281 Muscle weakness (generalized): Secondary | ICD-10-CM

## 2023-12-11 DIAGNOSIS — R293 Abnormal posture: Secondary | ICD-10-CM | POA: Diagnosis not present

## 2023-12-11 DIAGNOSIS — R1084 Generalized abdominal pain: Secondary | ICD-10-CM

## 2023-12-11 NOTE — Therapy (Signed)
OUTPATIENT PHYSICAL THERAPY FEMALE PELVIC TREATMENT   Patient Name: Meghan Barry MRN: [id number] DOB:08/21/1981, 42 y.o., female Today's Date: 12/11/2023  END OF SESSION:  PT End of Session - 12/11/23 0849     Visit Number 8    Date for PT Re-Evaluation 12/07/23    Authorization Type [employer]    PT Start Time 0845    PT Stop Time 0930    PT Time Calculation (min) 45 min    Activity Tolerance Patient tolerated treatment well    Behavior During Therapy [facility] for tasks assessed/performed              Past Medical History:  Diagnosis Date   Anxiety    Bilateral ovarian cysts    Depression    Insomnia    Stomach ulcer    Venous insufficiency    Venous insufficiency    ablations l leg 2009.,2011   Past Surgical History:  Procedure Laterality Date   ABDOMINAL HYSTERECTOMY  08/2017   anterior posterior vaginal wall repair     BIOPSY  08/01/2018   Procedure: BIOPSY;  Surgeon: Lynann Bologna, MD;  Location: [facility] ENDOSCOPY;  Service: Endoscopy;;   bladder tact  2018   BREAST CYST ASPIRATION Right    CHOLECYSTECTOMY N/A 10/03/2018   Procedure: DIAGNOSTIC LAPAROSCOPY, LAPAROSCOPIC CHOLECYSTECTOMY;  Surgeon: Gaynelle Adu, MD;  Location: [facility] ORS;  Service: General;  Laterality: N/A;   DILATION AND CURETTAGE OF UTERUS     ESOPHAGOGASTRODUODENOSCOPY N/A 08/01/2018   Procedure: ESOPHAGOGASTRODUODENOSCOPY (EGD);  Surgeon: Lynann Bologna, MD;  Location: [facility] ENDOSCOPY;  Service: Endoscopy;  Laterality: N/A;   left ovary removal  2011   OVARIAN CYST SURGERY     TONSILLECTOMY  1988   WRIST SURGERY  11/2015   bone graft   Patient Active Problem List   Diagnosis Date Noted   Acute abdominal pain 12/05/2023   Hypokalemia 12/05/2023   Cholecystitis 10/02/2018   Stomach ulcer    Gastritis 08/01/2018   Abdominal pain 07/30/2018   Anxiety 07/30/2018   Depression 07/30/2018   PUD (peptic ulcer disease) 07/30/2018    PCP: Henrine Screws, MD    REFERRING PROVIDER: Henrine Screws, MD   REFERRING DIAG:  G89.29 (ICD-10-CM) - Other chronic pain  R10.2 (ICD-10-CM) - Pelvic and perineal pain    THERAPY DIAG:  Abnormal posture  Generalized abdominal pain  Rectocele  Pelvic pain  Muscle weakness (generalized)  Cramp and spasm  Right shoulder pain, unspecified chronicity  Rationale for Evaluation and Treatment: Rehabilitation  ONSET DATE: 2017  SUBJECTIVE:    Pt reports that she has a consult with [facility] coming up, awaiting a surgery. Was in an emergency room last week and saw her GI doctor.  There is a lot of pressure in her abdomen. Has been doing her TENS unit for tibial nerve stimulation. Shoulder is feeling good, planning on Discharging PT for that  SUBJECTIVE STATEMENT:  Fluid intake: Yes:      PAIN:  Are you having pain? Yes NPRS scale: 8-9/10 Pain location: Internal, External, Deep, Bilateral, Anal, Vaginal, Anterior, and Posterior  Pain type: burning and sharp with INTERCOURSE and in general, after intercourse Pain description: intermittent, sharp, burning, dull, stabbing, and aching   Aggravating factors: everything Relieving factors: rest  PRECAUTIONS: None  RED FLAGS: None   WEIGHT BEARING RESTRICTIONS: No  FALLS:  Has patient fallen in last 6 months? No  LIVING ENVIRONMENT: Lives with: lives with their family Lives in: House/apartment Stairs: yes Has following equipment at home: None  OCCUPATION: stay at home mom  PLOF: Independent  PATIENT GOALS: pain reduction  PERTINENT HISTORY:  See above Sexual abuse: No  BOWEL MOVEMENT: Pain with bowel movement: Yes Type of bowel movement:Strain Yes and Splinting uses femmeze or pelvic wand Fully empty rectum: No Leakage: No Pads: Yes:   Fiber supplement:  No  URINATION: Pain with urination: No Fully empty bladder: No Stream: Strong Urgency: No Frequency: no Leakage:  no Pads: No  INTERCOURSE: Pain with intercourse: Initial Penetration, During Penetration, After Intercourse, and Pain Interrupts Intercourse Ability to have vaginal penetration:  Yes:   Climax: not assessed [name] Scale: 2/3  PREGNANCY: Vaginal deliveries 2 Tearing Yes:   C-section deliveries  Currently pregnant No  PROLAPSE: Cystocele   and Rectocele     OBJECTIVE:   DIAGNOSTIC FINDINGS:  Tight and tender scars throughout abdomen.  COGNITION: Overall cognitive status: Within functional limits for tasks assessed     SENSATION: Light touch: Appears intact Proprioception: Appears intact   POSTURE: No Significant postural limitations and anterior pelvic tilt   LUMBARAROM/PROM:    General  WFL                External Perineal Exam tenderness to touch throughout                             Internal Pelvic Floor tenderness to touch throughout  Patient confirms identification and approves PT to assess internal pelvic floor and treatment Yes  PELVIC MMT:   MMT eval  Vaginal   Internal Anal Sphincter   External Anal Sphincter   Puborectalis   Diastasis Recti   (Blank rows = not tested)        TONE: low  PROLAPSE: yes  TODAY'S TREATMENT:                                                                                                                              DATE: 12/11/23   Manual, e-stim: dry needling bilateral Rectus abdominis TPs and tight abdominal scars with e-stim and manually, STM abdomen   Therapeutic activities: review of HEP, education on prolapse support underwear Pelvic Bra  PATIENT EDUCATION:  Education details: discussed estogen cream, patch Person educated: Patient Education method: Explanation, Demonstration, and Verbal cues Education comprehension: verbalized understanding,  returned demonstration, and needs  further education  HOME EXERCISE PROGRAM: Added cupping to abdominal restricted scars Manual therapy: Trigger Point Dry-Needling  Treatment instructions: Expect mild to moderate muscle soreness. S/S of pneumothorax if dry needled over a lung field, and to seek immediate medical attention should they occur. Patient verbalized understanding of these instructions and education.  Patient Consent Given: Yes Education handout provided: Yes Muscles treated: bilateral rectus abdominis Electrical stimulation performed: No Parameters: N/A Treatment response/outcome: release of abdominal trigger points   ASSESSMENT:  CLINICAL IMPRESSION: Pt awaiting surgical consult after seeing her GI doctor last week. Home TENS tibial stimulation helping with abdominal pain, discussed placement of electrodes directly on abdomen. Pt losing weight, not tolerating foods. Pt to try prolapse support underwear to reduce pelvic pressure. Pt will continue to benefit from PT, Reeval completed  OBJECTIVE IMPAIRMENTS: increased fascial restrictions and pain.   ACTIVITY LIMITATIONS: standing, sleeping, and caring for others  PARTICIPATION LIMITATIONS: cleaning, laundry, and interpersonal relationship  PERSONAL FACTORS: Behavior pattern, Past/current experiences, and Time since onset of injury/illness/exacerbation are also affecting patient's functional outcome.   REHAB POTENTIAL: Good  CLINICAL DECISION MAKING: Stable/uncomplicated  EVALUATION COMPLEXITY: Low   GOALS: Goals reviewed with patient? Yes  SHORT TERM GOALS: Target date: 12/17/23  Pt will report her BMs are complete due to improved bowel habits and evacuation techniques.  Baseline: Goal status: progressing  2.  Pt will be independent with use of squatty potty, relaxed toileting mechanics, and improved bowel movement techniques in order to increase ease of bowel movements and complete evacuation.   Baseline:  Goal status: met  3.  Pt will be  independent with HEP.   Baseline:  Goal status: met  LONG TERM GOALS: Target date: 3/23   Pt will be independent with advanced HEP.   Baseline:  Goal status: INITIAL  2.  Pt will report 0/10 pain with vaginal penetration in order to improve intimate relationship with partner.    Baseline:  Goal status: INITIAL  3.  Pt will report at least 75 % less abdominal bloating and discomfort due to improved muscle tone throughout the core  Baseline:  Goal status: INITIAL  PLAN:  PT FREQUENCY: 1x/week  PT DURATION: 6 months  PLANNED INTERVENTIONS: Therapeutic exercises, Therapeutic activity, Neuromuscular re-education, Balance training, Gait training, Patient/Family education, Self Care, Joint mobilization, Joint manipulation, Dry Needling, Electrical stimulation, Spinal manipulation, Spinal mobilization, scar mobilization, and Manual therapy  PLAN FOR NEXT SESSION: depend on surgery date   Kabir Brannock, PT 12/11/23 10:45 AM

## 2023-12-25 ENCOUNTER — Encounter: Payer: Self-pay | Admitting: Physical Therapy

## 2023-12-25 ENCOUNTER — Ambulatory Visit: Admitting: Physical Therapy

## 2023-12-25 DIAGNOSIS — R293 Abnormal posture: Secondary | ICD-10-CM | POA: Diagnosis not present

## 2023-12-25 DIAGNOSIS — R102 Pelvic and perineal pain: Secondary | ICD-10-CM

## 2023-12-25 DIAGNOSIS — N816 Rectocele: Secondary | ICD-10-CM

## 2023-12-25 DIAGNOSIS — R252 Cramp and spasm: Secondary | ICD-10-CM

## 2023-12-25 DIAGNOSIS — M6281 Muscle weakness (generalized): Secondary | ICD-10-CM

## 2023-12-25 NOTE — Therapy (Signed)
 OUTPATIENT PHYSICAL THERAPY FEMALE PELVIC TREATMENT   Patient Name: Meghan Barry MRN: [id number] DOB:06-Dec-1981, 42 y.o., female Today's Date: 12/25/2023  END OF SESSION:  PT End of Session - 12/25/23 0900     Visit Number 9    Date for PT Re-Evaluation 12/07/23    Authorization Type [employer]    PT Start Time 0855    PT Stop Time 0930    PT Time Calculation (min) 35 min    Activity Tolerance Patient tolerated treatment well    Behavior During Therapy [facility] for tasks assessed/performed               Past Medical History:  Diagnosis Date   Anxiety    Bilateral ovarian cysts    Depression    Insomnia    Stomach ulcer    Venous insufficiency    Venous insufficiency    ablations l leg 2009.,2011   Past Surgical History:  Procedure Laterality Date   ABDOMINAL HYSTERECTOMY  08/2017   anterior posterior vaginal wall repair     BIOPSY  08/01/2018   Procedure: BIOPSY;  Surgeon: Charlanne Groom, MD;  Location: [facility] ENDOSCOPY;  Service: Endoscopy;;   bladder tact  2018   BREAST CYST ASPIRATION Right    CHOLECYSTECTOMY N/A 10/03/2018   Procedure: DIAGNOSTIC LAPAROSCOPY, LAPAROSCOPIC CHOLECYSTECTOMY;  Surgeon: Tanda Locus, MD;  Location: [facility];  Service: General;  Laterality: N/A;   DILATION AND CURETTAGE OF UTERUS     ESOPHAGOGASTRODUODENOSCOPY N/A 08/01/2018   Procedure: ESOPHAGOGASTRODUODENOSCOPY (EGD);  Surgeon: Charlanne Groom, MD;  Location: [facility] ENDOSCOPY;  Service: Endoscopy;  Laterality: N/A;   left ovary removal  2011   OVARIAN CYST SURGERY     TONSILLECTOMY  1988   WRIST SURGERY  11/2015   bone graft   Patient Active Problem List   Diagnosis Date Noted   Acute abdominal pain 12/05/2023   Hypokalemia 12/05/2023   Cholecystitis 10/02/2018   Stomach ulcer    Gastritis 08/01/2018   Abdominal pain 07/30/2018   Anxiety 07/30/2018   Depression 07/30/2018   PUD (peptic ulcer disease) 07/30/2018    PCP: Frederik Charleston, MD    REFERRING PROVIDER: Frederik Charleston, MD   REFERRING DIAG:  G89.29 (ICD-10-CM) - Other chronic pain  R10.2 (ICD-10-CM) - Pelvic and perineal pain    THERAPY DIAG:  Pelvic pain  Muscle weakness (generalized)  Cramp and spasm  Rectocele  Rationale for Evaluation and Treatment: Rehabilitation  ONSET DATE: 2017  SUBJECTIVE:   Pt reports that she travelled last week for for 7 hours, sitting was hard on her tailbone. Has had bowel movements, but has been constipated.  SUBJECTIVE STATEMENT:  Fluid intake: Yes:      PAIN:  Are you having pain? Yes NPRS scale: 8-9/10 Pain location: Internal, External, Deep, Bilateral, Anal, Vaginal, Anterior, and Posterior  Pain type: burning and sharp with INTERCOURSE and in general, after intercourse Pain description: intermittent, sharp, burning, dull, stabbing, and aching   Aggravating factors: everything Relieving factors: rest  PRECAUTIONS: None  RED FLAGS: None   WEIGHT BEARING RESTRICTIONS: No  FALLS:  Has patient fallen in last 6 months? No  LIVING ENVIRONMENT: Lives with: lives with their family Lives in: House/apartment Stairs: yes Has following equipment at home: None  OCCUPATION: stay at home mom  PLOF: Independent  PATIENT GOALS: pain reduction  PERTINENT HISTORY:  See above Sexual abuse: No  BOWEL MOVEMENT: Pain with bowel movement: Yes Type of bowel movement:Strain Yes and Splinting uses femmeze or pelvic wand Fully empty rectum: No Leakage: No Pads: Yes:   Fiber supplement: No  URINATION: Pain with urination: No Fully empty bladder: No Stream: Strong Urgency: No Frequency: no Leakage:  no Pads: No  INTERCOURSE: Pain with intercourse: Initial Penetration, During Penetration, After Intercourse, and Pain Interrupts Intercourse Ability to  have vaginal penetration:  Yes:   Climax: not assessed [name] Scale: 2/3  PREGNANCY: Vaginal deliveries 2 Tearing Yes:   C-section deliveries  Currently pregnant No  PROLAPSE: Cystocele   and Rectocele     OBJECTIVE:   DIAGNOSTIC FINDINGS:  Tight and tender scars throughout abdomen.  COGNITION: Overall cognitive status: Within functional limits for tasks assessed     SENSATION: Light touch: Appears intact Proprioception: Appears intact   POSTURE: No Significant postural limitations and anterior pelvic tilt   LUMBARAROM/PROM:    General  WFL                External Perineal Exam tenderness to touch throughout                             Internal Pelvic Floor tenderness to touch throughout  Patient confirms identification and approves PT to assess internal pelvic floor and treatment Yes  PELVIC MMT:   MMT eval  Vaginal   Internal Anal Sphincter   External Anal Sphincter   Puborectalis   Diastasis Recti   (Blank rows = not tested)        TONE: low  PROLAPSE: yes  TODAY'S TREATMENT:                                                                                                                              DATE: 12/25/23     Manual- DN with TENS bilateral lumbar paraspinals and glutes, STM    Neuro reed- diaphragmatic breathing, cat/ cow, child's pose  Treatment instructions: First Select Initial or Subsequent and then (Multi-select), will start with all checked Initial Treatment: Pt instructed on dry needling rationale, procedures, and possible side effects. Pt instructed to expect  mild to moderate muscle soreness later today and/or tomorrow. Pt instructed in methods to reduce muscle soreness. Pt instructed to continue prescribed HEP. Because dry needling was performed over or adjacent to a lung field, pt was educated on S/S of pneumothorax and to seek immediate medical attention should they occur.  Patient was educated on signs and symptoms of  infection and other risk factors and advised to seek medical attention should they occur. Patient verbalized understanding of these instructions and education. Subsequent Treatment Instructions provided previously at initial dry needling treatment Instructions reviewed, if requested by the patient, prior to subsequent dry needling treatment  Patient Verbal Consent Given: Yes (Yes/No) Education handout provided: Yes (Yes/No/Previously Provided) Muscles treated: glutes and Lumbar paraspinals Electrical stimulation performed: Yes (Yes/No) Parameters: N/A (N/A, ) (Will change to only populate if yes is selected) Treatment response/outcome: decreased pain and improved AROM      PATIENT EDUCATION:  Education details: discussed estogen cream, patch Person educated: Patient Education method: Explanation, Demonstration, and Verbal cues Education comprehension: verbalized understanding, returned demonstration, and needs further education  HOME EXERCISE PROGRAM: Added cupping to abdominal restricted scars Manual therapy: Trigger Point Dry-Needling  Treatment instructions: Expect mild to moderate muscle soreness. S/S of pneumothorax if dry needled over a lung field, and to seek immediate medical attention should they occur. Patient verbalized understanding of these instructions and education.  Patient Consent Given: Yes Education handout provided: Yes Muscles treated: bilateral rectus abdominis Electrical stimulation performed: No Parameters: N/A Treatment response/outcome: release of abdominal trigger points   ASSESSMENT:  CLINICAL IMPRESSION: Pt responded well to dry needling with TENS and STM. Awaiting surgery. She is consistent with her HEP.   OBJECTIVE IMPAIRMENTS: increased fascial restrictions and pain.   ACTIVITY LIMITATIONS: standing, sleeping, and caring for others  PARTICIPATION LIMITATIONS: cleaning, laundry, and interpersonal relationship  PERSONAL FACTORS: Behavior  pattern, Past/current experiences, and Time since onset of injury/illness/exacerbation are also affecting patient's functional outcome.   REHAB POTENTIAL: Good  CLINICAL DECISION MAKING: Stable/uncomplicated  EVALUATION COMPLEXITY: Low   GOALS: Goals reviewed with patient? Yes  SHORT TERM GOALS: Target date: 12/17/23  Pt will report her BMs are complete due to improved bowel habits and evacuation techniques.  Baseline: Goal status: progressing  2.  Pt will be independent with use of squatty potty, relaxed toileting mechanics, and improved bowel movement techniques in order to increase ease of bowel movements and complete evacuation.   Baseline:  Goal status: met  3.  Pt will be independent with HEP.   Baseline:  Goal status: met  LONG TERM GOALS: Target date: 3/23   Pt will be independent with advanced HEP.   Baseline:  Goal status: INITIAL  2.  Pt will report 0/10 pain with vaginal penetration in order to improve intimate relationship with partner.    Baseline:  Goal status: INITIAL  3.  Pt will report at least 75 % less abdominal bloating and discomfort due to improved muscle tone throughout the core  Baseline:  Goal status: INITIAL  PLAN:  PT FREQUENCY: 1x/week  PT DURATION: 6 months  PLANNED INTERVENTIONS: Therapeutic exercises, Therapeutic activity, Neuromuscular re-education, Balance training, Gait training, Patient/Family education, Self Care, Joint mobilization, Joint manipulation, Dry Needling, Electrical stimulation, Spinal manipulation, Spinal mobilization, scar mobilization, and Manual therapy  PLAN FOR NEXT SESSION: continue strengthening and addressing symptoms as needed   Ellyson Rarick, PT 12/25/23 9:15 AM

## 2024-01-07 ENCOUNTER — Ambulatory Visit: Admitting: Physical Therapy

## 2024-01-15 ENCOUNTER — Ambulatory Visit: Admitting: Physical Therapy

## 2024-01-22 ENCOUNTER — Ambulatory Visit: Attending: Nurse Practitioner | Admitting: Physical Therapy

## 2024-01-22 ENCOUNTER — Encounter: Payer: Self-pay | Admitting: Physical Therapy

## 2024-01-22 DIAGNOSIS — R1084 Generalized abdominal pain: Secondary | ICD-10-CM | POA: Diagnosis present

## 2024-01-22 DIAGNOSIS — R293 Abnormal posture: Secondary | ICD-10-CM | POA: Insufficient documentation

## 2024-01-22 DIAGNOSIS — R252 Cramp and spasm: Secondary | ICD-10-CM | POA: Diagnosis present

## 2024-01-22 DIAGNOSIS — R102 Pelvic and perineal pain: Secondary | ICD-10-CM | POA: Insufficient documentation

## 2024-01-22 DIAGNOSIS — N816 Rectocele: Secondary | ICD-10-CM | POA: Insufficient documentation

## 2024-01-22 DIAGNOSIS — M6281 Muscle weakness (generalized): Secondary | ICD-10-CM | POA: Diagnosis present

## 2024-01-22 NOTE — Therapy (Addendum)
 OUTPATIENT PHYSICAL THERAPY FEMALE PELVIC TREATMENT/ later day discharge   Patient Name: Meghan Barry MRN: [id number] DOB:February 20, 1981, 43 y.o., female Today's Date: 01/22/2024  END OF SESSION:  PT End of Session - 01/22/24 1318     Visit Number 10    Date for PT Re-Evaluation 12/07/23    Authorization Type [employer]    PT Start Time 1233    PT Stop Time 1310    PT Time Calculation (min) 37 min    Activity Tolerance Patient tolerated treatment well    Behavior During Therapy [facility] for tasks assessed/performed                Past Medical History:  Diagnosis Date   Anxiety    Bilateral ovarian cysts    Depression    Insomnia    Stomach ulcer    Venous insufficiency    Venous insufficiency    ablations l leg 2009.,2011   Past Surgical History:  Procedure Laterality Date   ABDOMINAL HYSTERECTOMY  08/2017   anterior posterior vaginal wall repair     BIOPSY  08/01/2018   Procedure: BIOPSY;  Surgeon: Lynann Bologna, MD;  Location: [facility] ENDOSCOPY;  Service: Endoscopy;;   bladder tact  2018   BREAST CYST ASPIRATION Right    CHOLECYSTECTOMY N/A 10/03/2018   Procedure: DIAGNOSTIC LAPAROSCOPY, LAPAROSCOPIC CHOLECYSTECTOMY;  Surgeon: Gaynelle Adu, MD;  Location: [facility];  Service: General;  Laterality: N/A;   DILATION AND CURETTAGE OF UTERUS     ESOPHAGOGASTRODUODENOSCOPY N/A 08/01/2018   Procedure: ESOPHAGOGASTRODUODENOSCOPY (EGD);  Surgeon: Lynann Bologna, MD;  Location: [facility] ENDOSCOPY;  Service: Endoscopy;  Laterality: N/A;   left ovary removal  2011   OVARIAN CYST SURGERY     TONSILLECTOMY  1988   WRIST SURGERY  11/2015   bone graft   Patient Active Problem List   Diagnosis Date Noted   Acute abdominal pain 12/05/2023   Hypokalemia 12/05/2023   Cholecystitis 10/02/2018   Stomach ulcer    Gastritis 08/01/2018   Abdominal pain 07/30/2018   Anxiety 07/30/2018   Depression 07/30/2018   PUD (peptic ulcer disease) 07/30/2018    PCP: Henrine Screws,  MD    REFERRING PROVIDER: Henrine Screws, MD   REFERRING DIAG:  G89.29 (ICD-10-CM) - Other chronic pain  R10.2 (ICD-10-CM) - Pelvic and perineal pain    THERAPY DIAG:  Pelvic pain  Muscle weakness (generalized)  Cramp and spasm  Generalized abdominal pain  Abnormal posture  Rectocele  Rationale for Evaluation and Treatment: Rehabilitation  ONSET DATE: 2017  SUBJECTIVE:   Pt reports that she had pelvic angiogram at [facility] She is planning on doing a stent on the iliac on 2/25. Will also do a left kidney auto transplant after that She is frustrated with her digestive tract, has trapped gas.  Has been doing some low pressure exercises  SUBJECTIVE STATEMENT:  Fluid intake: Yes:      PAIN:  Are you having pain? Yes NPRS scale: 8-9/10 Pain location: Internal, External, Deep, Bilateral, Anal, Vaginal, Anterior, and Posterior  Pain type: burning and sharp with INTERCOURSE and in general, after intercourse Pain description: intermittent, sharp, burning, dull, stabbing, and aching   Aggravating factors: everything Relieving factors: rest  PRECAUTIONS: None  RED FLAGS: None   WEIGHT BEARING RESTRICTIONS: No  FALLS:  Has patient fallen in last 6 months? No  LIVING ENVIRONMENT: Lives with: lives with their family Lives in: House/apartment Stairs: yes Has following equipment at home: None  OCCUPATION: stay at home mom  PLOF: Independent  PATIENT GOALS: pain reduction  PERTINENT HISTORY:  See above Sexual abuse: No  BOWEL MOVEMENT: Pain with bowel movement: Yes Type of bowel movement:Strain Yes and Splinting uses femmeze or pelvic wand Fully empty rectum: No Leakage: No Pads: Yes:   Fiber supplement: No  URINATION: Pain with urination: No Fully empty bladder: No Stream:  Strong Urgency: No Frequency: no Leakage:  no Pads: No  INTERCOURSE: Pain with intercourse: Initial Penetration, During Penetration, After Intercourse, and Pain Interrupts Intercourse Ability to have vaginal penetration:  Yes:   Climax: not assessed [name] Scale: 2/3  PREGNANCY: Vaginal deliveries 2 Tearing Yes:   C-section deliveries  Currently pregnant No  PROLAPSE: Cystocele   and Rectocele     OBJECTIVE:   DIAGNOSTIC FINDINGS:  Tight and tender scars throughout abdomen.  COGNITION: Overall cognitive status: Within functional limits for tasks assessed     SENSATION: Light touch: Appears intact Proprioception: Appears intact   POSTURE: No Significant postural limitations and anterior pelvic tilt   LUMBARAROM/PROM:    General  WFL                External Perineal Exam tenderness to touch throughout                             Internal Pelvic Floor tenderness to touch throughout  Patient confirms identification and approves PT to assess internal pelvic floor and treatment Yes  PELVIC MMT:   MMT eval  Vaginal 4/5  Internal Anal Sphincter   External Anal Sphincter   Puborectalis   Diastasis Recti   (Blank rows = not tested)        TONE: low  PROLAPSE: yes  TODAY'S TREATMENT:                                                                                                                              DATE: 01/22/24     Manual- DN abdominals, cupping  Trigger Point Dry Needling  Subsequent Treatment: Instructions provided previously at initial dry needling treatment.   Patient Verbal Consent Given: Yes Education Handout Provided: Yes Muscles Treated: bilateral rectus abdominis Electrical Stimulation Performed: No Treatment Response/Outcome: less pain        PATIENT EDUCATION:  Education details: discussed estogen cream, patch Person educated: Patient Education method: Explanation, Demonstration, and Verbal cues Education  comprehension: verbalized understanding, returned demonstration, and needs further education  HOME EXERCISE PROGRAM: Added cupping to abdominal restricted scars Manual therapy: Trigger Point Dry-Needling  Treatment instructions: Expect mild to moderate muscle soreness. S/S of pneumothorax if dry needled over a lung field, and to seek immediate medical attention should they occur. Patient verbalized understanding of these instructions and education.  Patient Consent Given: Yes Education handout provided: Yes Muscles treated: bilateral rectus abdominis Electrical stimulation performed: No Parameters: N/A Treatment response/outcome: release of abdominal trigger points   ASSESSMENT:  CLINICAL IMPRESSION: Pt responded well to dry needling and cupping. Awaiting surgery. She is consistent with her HEP.   OBJECTIVE IMPAIRMENTS: increased fascial restrictions and pain.   ACTIVITY LIMITATIONS: standing, sleeping, and caring for others  PARTICIPATION LIMITATIONS: cleaning, laundry, and interpersonal relationship  PERSONAL FACTORS: Behavior pattern, Past/current experiences, and Time since onset of injury/illness/exacerbation are also affecting patient's functional outcome.   REHAB POTENTIAL: Good  CLINICAL DECISION MAKING: Stable/uncomplicated  EVALUATION COMPLEXITY: Low   GOALS: Goals reviewed with patient? Yes  SHORT TERM GOALS: Target date: 12/17/23  Pt will report her BMs are complete due to improved bowel habits and evacuation techniques.  Baseline: Goal status: progressing  2.  Pt will be independent with use of squatty potty, relaxed toileting mechanics, and improved bowel movement techniques in order to increase ease of bowel movements and complete evacuation.   Baseline:  Goal status: met  3.  Pt will be independent with HEP.   Baseline:  Goal status: met  LONG TERM GOALS: Target date: 3/23   Pt will be independent with advanced HEP.   Baseline:  Goal status:  INITIAL  2.  Pt will report 0/10 pain with vaginal penetration in order to improve intimate relationship with partner.    Baseline:  Goal status: INITIAL  3.  Pt will report at least 75 % less abdominal bloating and discomfort due to improved muscle tone throughout the core  Baseline:  Goal status: INITIAL  PLAN:  PT FREQUENCY: 1x/week  PT DURATION: 6 months  PLANNED INTERVENTIONS: Therapeutic exercises, Therapeutic activity, Neuromuscular re-education, Balance training, Gait training, Patient/Family education, Self Care, Joint mobilization, Joint manipulation, Dry Needling, Electrical stimulation, Spinal manipulation, Spinal mobilization, scar mobilization, and Manual therapy  PLAN FOR NEXT SESSION: continue strengthening and addressing symptoms as needed   Meghan Barry, PT 01/22/24 1:20 PM    PHYSICAL THERAPY DISCHARGE SUMMARY   Patient agrees to discharge. Patient goals were partially met. Patient is being discharged due to not returning since the last visit.   Meghan Barry, PT 04/03/24 8:57 AM

## 2024-02-04 ENCOUNTER — Telehealth: Payer: Self-pay | Admitting: Physical Therapy

## 2024-02-04 ENCOUNTER — Encounter: Admitting: Physical Therapy

## 2024-02-04 NOTE — Telephone Encounter (Signed)
 Called pt - she thought her PT appt was tomorrow. Scheduled one 2/18 at 2:45

## 2024-02-12 ENCOUNTER — Encounter: Payer: Self-pay | Admitting: Physical Therapy

## 2024-02-19 ENCOUNTER — Ambulatory Visit: Admitting: Neurology

## 2024-03-07 NOTE — Progress Notes (Signed)
 Surgery orders requested via Epic inbox.

## 2024-03-10 ENCOUNTER — Other Ambulatory Visit: Payer: Self-pay

## 2024-03-10 ENCOUNTER — Encounter (HOSPITAL_COMMUNITY): Payer: Self-pay | Admitting: General Surgery

## 2024-03-10 ENCOUNTER — Ambulatory Visit: Payer: Self-pay | Admitting: General Surgery

## 2024-03-10 NOTE — Progress Notes (Addendum)
 COVID Vaccine Completed:  Yes  Date of COVID positive in last 90 days:  No  PCP - Henrine Screws, MD Cardiologist -  [address] PA-C (last OV 2022) Neurologist - Ocie Doyne, MD Vascular - at [facility] for [facility]  Chest x-ray - N/A EKG - N/A Stress Test - N/A ECHO - N/A Cardiac Cath - N/A Pacemaker/ICD device last checked: Spinal Cord Stimulator:  N/A Long Term Monitor - 03-28-21 Epic  Bowel Prep - N/A  Sleep Study -  N/A CPAP -   Fasting Blood Sugar - N/A Checks Blood Sugar _____ times a day  Last dose of GLP1 agonist-  N/A GLP1 instructions:  Hold 7 days before surgery    Last dose of SGLT-2 inhibitors-  N/A SGLT-2 instructions:  Hold 3 days before surgery    Blood Thinner Instructions:  Eliquis and Plavix for iliac stent.  Patient has not received any instructions.  Will call Dr. Tawana Scale office today to make sure it is okay for her to stay on these meds.   Last dose:   Time: Aspirin Instructions: Last Dose:  Activity level:  Can go up a flight of stairs and perform activities of daily living without stopping and without symptoms of chest pain or shortness of breath.  Able to exercise without symptoms, patient states that she is very active.    Anesthesia review:  Neurocardiogenic syncope, venous insufficiency, palpitations  Iliac stent placed at [facility]   Patient denies shortness of breath, fever, cough and chest pain at PAT appointment (completed over the phone)  Patient verbalized understanding of instructions that were given to them at the PAT appointment. Patient was also instructed that they will need to review over the PAT instructions again at home before surgery.

## 2024-03-10 NOTE — Progress Notes (Signed)
 Anesthesia Chart Review   Case: [id number] Date/Time: 03/12/24 0815   Procedure: LAPAROSCOPY, DIAGNOSTIC   Anesthesia type: General   Diagnosis: Generalized abdominal pain [R10.84]   Pre-op diagnosis: CHRONIC ABDOMINAL PAIN   [facility] RO[facility] 04 / [facility]   Surgeons: Gaynelle Adu, MD       DISCUSSION:42 y.o. former smoker with h/o generalized abdominal pain scheduled for above procedure 03/12/2024 with Dr. Gaynelle Adu.   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 On Eliquis and Plavix, no instructions at PAT visit.  Spoke with triage nurse at [facility].  VS: Ht 5\' 9"  (1.753 m)   Wt 62.1 kg   BMI 20.23 kg/m   PROVIDERS: Henrine Screws, MD is PCP    LABS:  labs DOS, pt not seen in clinic (all labs ordered are listed, but only abnormal results are displayed)  Labs Reviewed - No data to display   IMAGES:   EKG:   CV:  Past Medical History:  Diagnosis Date   Anxiety    Bilateral ovarian cysts    Depression    Insomnia    Neurocardiogenic syncope    Stomach ulcer    Venous insufficiency    Venous insufficiency    ablations l leg 2009.,2011    Past Surgical History:  Procedure Laterality Date   ABDOMINAL HYSTERECTOMY  08/2017   anterior posterior vaginal wall repair     BIOPSY  08/01/2018   Procedure: BIOPSY;  Surgeon: Lynann Bologna, MD;  Location: [facility] ENDOSCOPY;  Service: Endoscopy;;   bladder tact  2018   BREAST CYST ASPIRATION Right    CHOLECYSTECTOMY N/A 10/03/2018   Procedure: DIAGNOSTIC LAPAROSCOPY, LAPAROSCOPIC CHOLECYSTECTOMY;  Surgeon: Gaynelle Adu, MD;  Location: [facility];  Service: General;  Laterality: N/A;   DILATION AND CURETTAGE OF UTERUS     ESOPHAGOGASTRODUODENOSCOPY N/A 08/01/2018   Procedure: ESOPHAGOGASTRODUODENOSCOPY (EGD);  Surgeon: Lynann Bologna, MD;  Location[facility] ENDOSCOPY;  Service: Endoscopy;  Laterality: N/A;   left ovary removal  2011   OVARIAN CYST SURGERY     TONSILLECTOMY  1988   WRIST SURGERY   11/2015   bone graft    MEDICATIONS: No current facility-administered medications for this encounter.    apixaban (ELIQUIS) 2.5 MG TABS tablet   ASTRAGALUS PO   clopidogrel (PLAVIX) 75 MG tablet   cyanocobalamin (VITAMIN B12) 1000 MCG tablet   Ferrous Sulfate (SLOW RELEASE IRON PO)   lidocaine (XYLOCAINE) 2 % jelly   lisdexamfetamine (VYVANSE) 40 MG capsule   Omega-3 Fatty Acids (FISH OIL) 1000 MG CAPS   OVER THE COUNTER MEDICATION   pyridOXINE (VITAMIN B6) 100 MG tablet   thiamine (VITAMIN B-1) 100 MG tablet    [facility], PA-C [facility]Surgical Testing [phone]

## 2024-03-12 ENCOUNTER — Encounter (HOSPITAL_COMMUNITY): Payer: Self-pay | Admitting: Physician Assistant

## 2024-03-12 ENCOUNTER — Ambulatory Visit (HOSPITAL_COMMUNITY): Admit: 2024-03-12 | Admitting: General Surgery

## 2024-03-12 DIAGNOSIS — Z01818 Encounter for other preprocedural examination: Secondary | ICD-10-CM

## 2024-03-12 DIAGNOSIS — I739 Peripheral vascular disease, unspecified: Secondary | ICD-10-CM

## 2024-03-12 HISTORY — DX: Syncope and collapse: R55

## 2024-03-12 SURGERY — LAPAROSCOPY, DIAGNOSTIC
Anesthesia: General

## 2024-06-03 NOTE — Progress Notes (Signed)
 Sent message, via epic in basket, requesting orders in epic from Careers adviser.

## 2024-06-05 ENCOUNTER — Ambulatory Visit: Payer: Self-pay | Admitting: General Surgery

## 2024-06-09 NOTE — Patient Instructions (Addendum)
 SURGICAL WAITING ROOM VISITATION Patients having surgery or a procedure may have no more than 2 support people in the waiting area - these visitors may rotate in the visitor waiting room.   If the patient needs to stay at the hospital during part of their recovery, the visitor guidelines for inpatient rooms apply.  PRE-OP VISITATION  Pre-op nurse will coordinate an appropriate time for 1 support person to accompany the patient in pre-op.  This support person may not rotate.  This visitor will be contacted when the time is appropriate for the visitor to come back in the pre-op area.  Please refer to the [facility] website for the visitor guidelines for Inpatients (after your surgery is over and you are in a regular room).  You are not required to quarantine at this time prior to your surgery. However, you must do this: Hand Hygiene often Do NOT share personal items Notify your provider if you are in close contact with someone who has COVID or you develop fever 100.4 or greater, new onset of sneezing, cough, sore throat, shortness of breath or body aches.  If you test positive for Covid or have been in contact with anyone that has tested positive in the last 10 days please notify you surgeon.    Your procedure is scheduled on:  MONDAY  June 23, 2024  Report to [facility] Main Entrance: [address] [address] entrance where the [employer] is available.   Report to admitting at:  12:45  PM  Call this number if you have any questions or problems the morning of surgery [phone]  FOLLOW ANY ADDITIONAL PRE OP INSTRUCTIONS YOU RECEIVED FROM YOUR SURGEON'S OFFICE!!!  Do not eat food after Midnight the night prior to your surgery/procedure.  After Midnight you may have the following liquids until   12:00 noon the DAY OF SURGERY  Clear Liquid Diet Water Black Coffee (sugar ok, NO MILK/CREAM OR CREAMERS)  Tea (sugar ok, NO MILK/CREAM OR CREAMERS) regular and decaf                              Plain Jell-O  with no fruit (NO RED)                                           Fruit ices (not with fruit pulp, NO RED)                                     Popsicles (NO RED)                                                                  Juice: NO CITRUS JUICES: only apple, WHITE grape, WHITE cranberry Sports drinks like Gatorade or Powerade (NO RED)                 Oral Hygiene is also important to reduce your risk of infection.        Remember - BRUSH YOUR TEETH THE MORNING OF SURGERY WITH YOUR REGULAR TOOTHPASTE  Do  NOT smoke after Midnight the night before surgery.  STOP TAKING all Vitamins, Herbs and supplements 1 week before your surgery.   Take ONLY these medicines the morning of surgery with A SIP OF WATER: none   DO NOT TAKE VYVANSE  the morning of your surgery.   You may not have any metal on your body including hair pins, jewelry, and body piercing  Do not wear make-up, lotions, powders, perfumes or deodorant  Do not wear nail polish including gel and S&S, artificial / acrylic nails, or any other type of covering on natural nails including finger and toenails. If you have artificial nails, gel coating, etc., that needs to be removed by a nail salon, Please have this removed prior to surgery. Not doing so may mean that your surgery could be cancelled or delayed if the Surgeon or anesthesia staff feels like they are unable to monitor you safely.   Do not shave 48 hours prior to surgery to avoid nicks in your skin which may contribute to postoperative infections.   Contacts, Hearing Aids, dentures or bridgework may not be worn into surgery. DENTURES WILL BE REMOVED PRIOR TO SURGERY PLEASE DO NOT APPLY Poly grip OR ADHESIVES!!!  Patients discharged on the day of surgery will not be allowed to drive home.  Someone NEEDS to stay with you for the first 24 hours after anesthesia.  Do not bring your home medications to the hospital. The Pharmacy will dispense medications listed  on your medication list to you during your admission in the Hospital.  Please read over the following fact sheets you were given: IF YOU HAVE QUESTIONS ABOUT YOUR PRE-OP INSTRUCTIONS, PLEASE CALL [phone]   [facility] - Preparing for Surgery Before surgery, you can play an important role.  Because skin is not sterile, your skin needs to be as free of germs as possible.  You can reduce the number of germs on your skin by washing with CHG (chlorahexidine gluconate) soap before surgery.  CHG is an antiseptic cleaner which kills germs and bonds with the skin to continue killing germs even after washing. Please DO NOT use if you have an allergy to CHG or antibacterial soaps.  If your skin becomes reddened/irritated stop using the CHG and inform your nurse when you arrive at Short Stay. Do not shave (including legs and underarms) for at least 48 hours prior to the first CHG shower.  You may shave your face/neck.  Please follow these instructions carefully:  1.  Shower with CHG Soap the night before surgery and the  morning of surgery.  2.  If you choose to wash your hair, wash your hair first as usual with your normal  shampoo.  3.  After you shampoo, rinse your hair and body thoroughly to remove the shampoo.                             4.  Use CHG as you would any other liquid soap.  You can apply chg directly to the skin and wash.  Gently with a scrungie or clean washcloth.  5.  Apply the CHG Soap to your body ONLY FROM THE NECK DOWN.   Do not use on face/ open                           Wound or open sores. Avoid contact with eyes, ears mouth and genitals (private parts).  Wash face,  Genitals (private parts) with your normal soap.             6.  Wash thoroughly, paying special attention to the area where your  surgery  will be performed.  7.  Thoroughly rinse your body with warm water from the neck down.  8.  DO NOT shower/wash with your normal soap after using and rinsing  off the CHG Soap.            9.  Pat yourself dry with a clean towel.            10.  Wear clean pajamas.            11.  Place clean sheets on your bed the night of your first shower and do not  sleep with pets.  ON THE DAY OF SURGERY : Do not apply any lotions/deodorants the morning of surgery.  Please wear clean clothes to the hospital/surgery center.     FAILURE TO FOLLOW THESE INSTRUCTIONS MAY RESULT IN THE CANCELLATION OF YOUR SURGERY  PATIENT SIGNATURE_________________________________  NURSE SIGNATURE__________________________________  ________________________________________________________________________

## 2024-06-09 NOTE — Progress Notes (Signed)
 COVID Vaccine received:  []  No [x]  Yes Date of any COVID positive Test in last 90 days:  PCP - Ruven Coy, MD , Albertina Alpers FNP at [facility] [phone] (Work) [phone] (Fax)  Cardiologist - Mercy Stall, PA-C ([id number]) Neurologist- Dala Dublin, MD  Vascular - [facility], ([facility])  [phone] (Work) [phone] (Fax)   Chest x-ray - 01-08-2021  2v  Epic EKG -  03-04-21   Will repeat Stress Test -  ECHO -  Cardiac Cath -  CT Coronary Calcium score:  Zio Monitor-  03-28-2021  Epic  Bowel Prep - [x]  No  []   Yes ______  Pacemaker / ICD device [x]  No []  Yes   Spinal Cord Stimulator:[x]  No []  Yes       History of Sleep Apnea? [x]  No []  Yes   CPAP used?- [x]  No []  Yes    Does the patient monitor blood sugar?   [x]  N/A   []  No []  Yes  Patient has: [x]  NO Hx DM   []  Pre-DM   []  DM1  []   DM2  Blood Thinner / Instructions:  none Aspirin Instructions:  ASA 325 mg, continue per note in Special needs section of booking  ERAS Protocol Ordered: []  No  [x]  Yes PRE-SURGERY []  ENSURE  []  G2   [x]  No Drink Ordered Patient is to be NPO after: 1200 noon  Dental hx: []  Dentures:  []  N/A      []  Bridge or Partial:                   []  Loose or Damaged teeth:   Comments:      Can go up a flight of stairs and perform activities of daily living without stopping and without symptoms of chest pain or shortness of breath.  Able to exercise without symptoms, patient states that she is very active.    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  Patient verbalized understanding and agreement to the Pre-Surgical Instructions that were given to them at this PAT appointment. Patient was also educated of the need to review  these PAT instructions again prior to her surgery.I reviewed the appropriate phone numbers to call if they have any and questions or concerns.

## 2024-06-10 ENCOUNTER — Other Ambulatory Visit: Payer: Self-pay

## 2024-06-10 ENCOUNTER — Encounter (HOSPITAL_COMMUNITY): Payer: Self-pay

## 2024-06-10 ENCOUNTER — Encounter (HOSPITAL_COMMUNITY)
Admission: RE | Admit: 2024-06-10 | Discharge: 2024-06-10 | Disposition: A | Discharge status: Home / Self Care | Source: Ambulatory Visit | Attending: General Surgery

## 2024-06-10 VITALS — BP 104/73 | HR 68 | Temp 98.0°F | Resp 12 | Ht 69.0 in | Wt 145.0 lb

## 2024-06-10 DIAGNOSIS — F909 Attention-deficit hyperactivity disorder, unspecified type: Secondary | ICD-10-CM | POA: Diagnosis not present

## 2024-06-10 DIAGNOSIS — Z79899 Other long term (current) drug therapy: Secondary | ICD-10-CM | POA: Diagnosis not present

## 2024-06-10 DIAGNOSIS — Z01818 Encounter for other preprocedural examination: Secondary | ICD-10-CM | POA: Diagnosis present

## 2024-06-10 DIAGNOSIS — Z87891 Personal history of nicotine dependence: Secondary | ICD-10-CM | POA: Insufficient documentation

## 2024-06-10 DIAGNOSIS — R1084 Generalized abdominal pain: Secondary | ICD-10-CM | POA: Insufficient documentation

## 2024-06-10 HISTORY — DX: Other specified health status: Z78.9

## 2024-06-10 LAB — COMPREHENSIVE METABOLIC PANEL WITH GFR
ALT: 17 U/L (ref 0–44)
AST: 20 U/L (ref 15–41)
Albumin: 4.3 g/dL (ref 3.5–5.0)
Alkaline Phosphatase: 33 U/L — ABNORMAL LOW (ref 38–126)
Anion gap: 10 (ref 5–15)
BUN: 8 mg/dL (ref 6–20)
CO2: 27 mmol/L (ref 22–32)
Calcium: 9.2 mg/dL (ref 8.9–10.3)
Chloride: 100 mmol/L (ref 98–111)
Creatinine, Ser: 0.88 mg/dL (ref 0.44–1.00)
GFR, Estimated: >60 mL/min (ref >60)
Glucose, Bld: 97 mg/dL (ref 70–99)
Potassium: 4.4 mmol/L (ref 3.5–5.1)
Sodium: 137 mmol/L (ref 135–145)
Total Bilirubin: 2 mg/dL — ABNORMAL HIGH (ref 0.0–1.2)
Total Protein: 7.2 g/dL (ref 6.5–8.1)

## 2024-06-10 LAB — CBC
HCT: 42.3 % (ref 36.0–46.0)
Hemoglobin: 14.3 g/dL (ref 12.0–15.0)
MCH: 32.4 pg (ref 26.0–34.0)
MCHC: 33.8 g/dL (ref 30.0–36.0)
MCV: 95.7 fL (ref 80.0–100.0)
Platelets: 292 10*3/uL (ref 150–400)
RBC: 4.42 MIL/uL (ref 3.87–5.11)
RDW: 11.8 % (ref 11.5–15.5)
WBC: 4.9 10*3/uL (ref 4.0–10.5)
nRBC: 0 % (ref 0.0–0.2)

## 2024-06-11 NOTE — Progress Notes (Signed)
 Anesthesia Chart Review   Case: [id number] Date/Time: 06/23/24 1445   Procedure: LAPAROSCOPY, DIAGNOSTIC   Anesthesia type: General   Diagnosis: Generalized abdominal pain [R10.84]   Pre-op diagnosis: CHRONIC ABDOMINAL PAIN   Location: [facility] ROOM 02 / [facility]   Surgeons: Aldean Hummingbird, MD       DISCUSSION:43 y.o. former smoker with h/o generalized abdominal pain scheduled for above procedure 06/23/2024 with Dr. Aldean Hummingbird.   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 Per Vascular notes 05/27/2024 common iliac stent widely patent, pt can stop all blood thinners and continue on ASA. Per [facility] she will remain on ASA for procedure.   Pt is very active, exercises without sx. EKG at PAT with no significant changes.  VS: BP 104/73 Comment: right arm sitting  Pulse 68   Temp 36.7 C (Oral)   Resp 12   Ht 5' 9 (1.753 m)   Wt 65.8 kg   SpO2 100%   BMI 21.41 kg/m   PROVIDERS: Ruven Coy, MD is PCP    LABS: Labs reviewed: Acceptable for surgery. (all labs ordered are listed, but only abnormal results are displayed)  Labs Reviewed  COMPREHENSIVE METABOLIC PANEL WITH GFR - Abnormal; Notable for the following components:      Result Value   Alkaline Phosphatase 33 (*)    Total Bilirubin 2.0 (*)    All other components within normal limits  CBC     IMAGES:   EKG:   CV:  Past Medical History:  Diagnosis Date   Anxiety    Bilateral ovarian cysts    Depression    Insomnia    Medical history non-contributory    Neurocardiogenic syncope    Stomach ulcer    Venous insufficiency    ablations l leg 2009.,2011    Past Surgical History:  Procedure Laterality Date   ABDOMINAL HYSTERECTOMY  08/2017   anterior posterior vaginal wall repair     BIOPSY  08/01/2018   Procedure: BIOPSY;  Surgeon: Lajuan Pila, MD;  Location: [facility] ENDOSCOPY;  Service: Endoscopy;;   bladder tact  2018   BREAST CYST ASPIRATION Right    CHOLECYSTECTOMY N/A  10/03/2018   Procedure: DIAGNOSTIC LAPAROSCOPY, LAPAROSCOPIC CHOLECYSTECTOMY;  Surgeon: Aldean Hummingbird, MD;  Location: [facility];  Service: General;  Laterality: N/A;   DILATION AND CURETTAGE OF UTERUS     ESOPHAGOGASTRODUODENOSCOPY N/A 08/01/2018   Procedure: ESOPHAGOGASTRODUODENOSCOPY (EGD);  Surgeon: Lajuan Pila, MD;  Location: [facility] ENDOSCOPY;  Service: Endoscopy;  Laterality: N/A;   left ovary removal  2011   OVARIAN CYST SURGERY     TONSILLECTOMY  1988   WRIST SURGERY Left 11/2015   bone graft    MEDICATIONS:  aspirin EC 325 MG tablet   ASTRAGALUS PO   cholecalciferol  (VITAMIN D3) 25 MCG (1000 UNIT) tablet   cyanocobalamin  (VITAMIN B12) 1000 MCG tablet   Ferrous Sulfate  (SLOW RELEASE IRON PO)   lidocaine  (XYLOCAINE ) 2 % jelly   lisdexamfetamine (VYVANSE ) 40 MG capsule   Multiple Vitamin (MULTI VITAMIN) TABS   Omega-3 Fatty Acids (FISH OIL) 1000 MG CAPS   OVER THE COUNTER MEDICATION   pyridOXINE  (VITAMIN B6) 100 MG tablet   thiamine  (VITAMIN B-1) 100 MG tablet   No current facility-administered medications for this encounter.    Chick Cotton Ward, PA-C [facility]-Surgical Testing [phone]

## 2024-06-23 ENCOUNTER — Encounter (HOSPITAL_COMMUNITY): Payer: Self-pay | Admitting: General Surgery

## 2024-06-23 ENCOUNTER — Ambulatory Visit (HOSPITAL_BASED_OUTPATIENT_CLINIC_OR_DEPARTMENT_OTHER)

## 2024-06-23 ENCOUNTER — Ambulatory Visit (HOSPITAL_COMMUNITY): Payer: Self-pay | Admitting: Physician Assistant

## 2024-06-23 ENCOUNTER — Ambulatory Visit (HOSPITAL_COMMUNITY)
Admission: RE | Admit: 2024-06-23 | Discharge: 2024-06-23 | Disposition: A | Discharge status: Home / Self Care | Source: Ambulatory Visit | Attending: General Surgery | Admitting: General Surgery

## 2024-06-23 ENCOUNTER — Other Ambulatory Visit: Payer: Self-pay

## 2024-06-23 ENCOUNTER — Encounter (HOSPITAL_COMMUNITY)
Admission: RE | Disposition: A | Discharge status: Home / Self Care | Payer: Self-pay | Source: Ambulatory Visit | Attending: General Surgery

## 2024-06-23 DIAGNOSIS — Z9049 Acquired absence of other specified parts of digestive tract: Secondary | ICD-10-CM | POA: Diagnosis not present

## 2024-06-23 DIAGNOSIS — Z87891 Personal history of nicotine dependence: Secondary | ICD-10-CM | POA: Insufficient documentation

## 2024-06-23 DIAGNOSIS — N816 Rectocele: Secondary | ICD-10-CM | POA: Insufficient documentation

## 2024-06-23 DIAGNOSIS — G8929 Other chronic pain: Secondary | ICD-10-CM | POA: Diagnosis not present

## 2024-06-23 DIAGNOSIS — K660 Peritoneal adhesions (postprocedural) (postinfection): Secondary | ICD-10-CM | POA: Diagnosis not present

## 2024-06-23 DIAGNOSIS — Q969 Turner's syndrome, unspecified: Secondary | ICD-10-CM | POA: Diagnosis not present

## 2024-06-23 DIAGNOSIS — R109 Unspecified abdominal pain: Secondary | ICD-10-CM | POA: Insufficient documentation

## 2024-06-23 DIAGNOSIS — R1084 Generalized abdominal pain: Secondary | ICD-10-CM | POA: Diagnosis present

## 2024-06-23 DIAGNOSIS — Z90710 Acquired absence of both cervix and uterus: Secondary | ICD-10-CM | POA: Diagnosis not present

## 2024-06-23 HISTORY — PX: LAPAROSCOPY: SHX197

## 2024-06-23 SURGERY — LAPAROSCOPY, DIAGNOSTIC
Anesthesia: General

## 2024-06-23 MED ORDER — ONDANSETRON HCL 4 MG/2ML IJ SOLN
INTRAMUSCULAR | Status: DC | PRN
  Administered 2024-06-23: 4 mg via INTRAVENOUS

## 2024-06-23 MED ORDER — KETOROLAC TROMETHAMINE 30 MG/ML IJ SOLN
INTRAMUSCULAR | Status: DC | PRN
Start: 2024-06-23 — End: 2024-06-23
  Administered 2024-06-23: 30 mg via INTRAVENOUS

## 2024-06-23 MED ORDER — LIDOCAINE HCL (PF) 2 % IJ SOLN
INTRAMUSCULAR | Status: AC
  Filled 2024-06-23: qty 5

## 2024-06-23 MED ORDER — ROCURONIUM BROMIDE 100 MG/10ML IV SOLN
INTRAVENOUS | Status: DC | PRN
  Administered 2024-06-23: 50 mg via INTRAVENOUS
  Administered 2024-06-23: 10 mg via INTRAVENOUS

## 2024-06-23 MED ORDER — STERILE WATER FOR IRRIGATION IR SOLN
Status: DC | PRN
  Administered 2024-06-23: 1000 mL

## 2024-06-23 MED ORDER — FENTANYL CITRATE (PF) 100 MCG/2ML IJ SOLN
INTRAMUSCULAR | Status: DC | PRN
  Administered 2024-06-23 (×3): 50 ug via INTRAVENOUS

## 2024-06-23 MED ORDER — BUPIVACAINE-EPINEPHRINE (PF) 0.25% -1:200000 IJ SOLN
INTRAMUSCULAR | Status: AC
  Filled 2024-06-23: qty 30

## 2024-06-23 MED ORDER — HYDROMORPHONE HCL 1 MG/ML IJ SOLN
INTRAMUSCULAR | Status: AC
Start: 2024-06-23 — End: 2024-06-23
  Filled 2024-06-23: qty 1

## 2024-06-23 MED ORDER — ROCURONIUM BROMIDE 10 MG/ML (PF) SYRINGE
PREFILLED_SYRINGE | INTRAVENOUS | Status: AC
  Filled 2024-06-23: qty 10

## 2024-06-23 MED ORDER — PROPOFOL 1000 MG/100ML IV EMUL
INTRAVENOUS | Status: AC
  Filled 2024-06-23: qty 100

## 2024-06-23 MED ORDER — HYDROMORPHONE HCL 1 MG/ML IJ SOLN
0.2500 mg | INTRAMUSCULAR | Status: DC | PRN
  Administered 2024-06-23 (×2): 0.25 mg via INTRAVENOUS

## 2024-06-23 MED ORDER — PROPOFOL 500 MG/50ML IV EMUL
INTRAVENOUS | Status: DC | PRN
  Administered 2024-06-23: 125 ug/kg/min via INTRAVENOUS

## 2024-06-23 MED ORDER — 0.9 % SODIUM CHLORIDE (POUR BTL) OPTIME
TOPICAL | Status: DC | PRN
  Administered 2024-06-23: 1000 mL

## 2024-06-23 MED ORDER — PROPOFOL 10 MG/ML IV BOLUS
INTRAVENOUS | Status: AC
  Filled 2024-06-23: qty 20

## 2024-06-23 MED ORDER — MIDAZOLAM HCL 5 MG/5ML IJ SOLN
INTRAMUSCULAR | Status: DC | PRN
  Administered 2024-06-23: 2 mg via INTRAVENOUS

## 2024-06-23 MED ORDER — CHLORHEXIDINE GLUCONATE CLOTH 2 % EX PADS: 6.0000 | MEDICATED_PAD | Freq: Once | CUTANEOUS | Status: DC

## 2024-06-23 MED ORDER — LACTATED RINGERS IV SOLN
INTRAVENOUS | Status: DC
Start: 2024-06-23 — End: 2024-06-23

## 2024-06-23 MED ORDER — KETOROLAC TROMETHAMINE 30 MG/ML IJ SOLN
INTRAMUSCULAR | Status: AC
  Filled 2024-06-23: qty 1

## 2024-06-23 MED ORDER — PROPOFOL 10 MG/ML IV BOLUS
INTRAVENOUS | Status: DC | PRN
  Administered 2024-06-23: 120 mg via INTRAVENOUS

## 2024-06-23 MED ORDER — SUGAMMADEX SODIUM 200 MG/2ML IV SOLN
INTRAVENOUS | Status: DC | PRN
  Administered 2024-06-23: 200 mg via INTRAVENOUS

## 2024-06-23 MED ORDER — ACETAMINOPHEN 500 MG PO TABS
1000.0000 mg | ORAL_TABLET | ORAL | Status: AC
  Administered 2024-06-23: 1000 mg via ORAL
  Filled 2024-06-23: qty 2

## 2024-06-23 MED ORDER — ONDANSETRON HCL 4 MG/2ML IJ SOLN
INTRAMUSCULAR | Status: AC
  Filled 2024-06-23: qty 2

## 2024-06-23 MED ORDER — ACETAMINOPHEN 500 MG PO TABS: 1000.0000 mg | ORAL_TABLET | Freq: Three times a day (TID) | ORAL | Status: AC

## 2024-06-23 MED ORDER — FENTANYL CITRATE (PF) 250 MCG/5ML IJ SOLN
INTRAMUSCULAR | Status: AC
  Filled 2024-06-23: qty 5

## 2024-06-23 MED ORDER — ORAL CARE MOUTH RINSE: 15.0000 mL | Freq: Once | OROMUCOSAL | Status: AC

## 2024-06-23 MED ORDER — DEXMEDETOMIDINE HCL IN NACL 80 MCG/20ML IV SOLN
INTRAVENOUS | Status: DC | PRN
Start: 2024-06-23 — End: 2024-06-23
  Administered 2024-06-23: 12 ug via INTRAVENOUS

## 2024-06-23 MED ORDER — MIDAZOLAM HCL 2 MG/2ML IJ SOLN
INTRAMUSCULAR | Status: AC
Start: 2024-06-23 — End: 2024-06-23
  Filled 2024-06-23: qty 2

## 2024-06-23 MED ORDER — CHLORHEXIDINE GLUCONATE 0.12 % MT SOLN
15.0000 mL | Freq: Once | OROMUCOSAL | Status: AC
  Administered 2024-06-23: 15 mL via OROMUCOSAL

## 2024-06-23 MED ORDER — LIDOCAINE HCL (CARDIAC) PF 100 MG/5ML IV SOSY
PREFILLED_SYRINGE | INTRAVENOUS | Status: DC | PRN
  Administered 2024-06-23: 60 mg via INTRAVENOUS

## 2024-06-23 MED ORDER — BUPIVACAINE-EPINEPHRINE 0.25% -1:200000 IJ SOLN
INTRAMUSCULAR | Status: DC | PRN
  Administered 2024-06-23: 25 mL

## 2024-06-23 MED ORDER — DEXAMETHASONE SODIUM PHOSPHATE 10 MG/ML IJ SOLN
INTRAMUSCULAR | Status: AC
  Filled 2024-06-23: qty 1

## 2024-06-23 MED ORDER — VANCOMYCIN HCL IN DEXTROSE 1-5 GM/200ML-% IV SOLN
1000.0000 mg | INTRAVENOUS | Status: AC
  Administered 2024-06-23: 1000 mg via INTRAVENOUS
  Filled 2024-06-23: qty 200

## 2024-06-23 SURGICAL SUPPLY — 45 items
BAG COUNTER SPONGE SURGICOUNT (BAG) IMPLANT
BLADE EXTENDED COATED 6.5IN (ELECTRODE) IMPLANT
BLADE SURG SZ10 CARB STEEL (BLADE) IMPLANT
CELLS DAT CNTRL 66122 CELL SVR (MISCELLANEOUS) IMPLANT
CHLORAPREP W/TINT 26 (MISCELLANEOUS) ×1 IMPLANT
CLIP APPLIE 5 13 M/L LIGAMAX5 (MISCELLANEOUS) IMPLANT
CLIP APPLIE ROT 10 11.4 M/L (STAPLE) IMPLANT
COVER MAYO STAND STRL (DRAPES) IMPLANT
COVER SURGICAL LIGHT HANDLE (MISCELLANEOUS) ×1 IMPLANT
DERMABOND ADVANCED .7 DNX12 (GAUZE/BANDAGES/DRESSINGS) IMPLANT
DRAPE SHEET LG 3/4 BI-LAMINATE (DRAPES) IMPLANT
DRAPE WARM FLUID 44X44 (DRAPES) IMPLANT
ELECT REM PT RETURN 15FT ADLT (MISCELLANEOUS) ×1 IMPLANT
GAUZE SPONGE 4X4 12PLY STRL (GAUZE/BANDAGES/DRESSINGS) ×1 IMPLANT
GLOVE BIO SURGEON STRL SZ7.5 (GLOVE) ×1 IMPLANT
GLOVE INDICATOR 8.0 STRL GRN (GLOVE) ×1 IMPLANT
GOWN STRL REUS W/ TWL XL LVL3 (GOWN DISPOSABLE) ×1 IMPLANT
HANDLE SUCTION POOLE (INSTRUMENTS) IMPLANT
IRRIGATION SUCT STRKRFLW 2 WTP (MISCELLANEOUS) IMPLANT
KIT BASIN OR (CUSTOM PROCEDURE TRAY) ×1 IMPLANT
KIT TURNOVER KIT A (KITS) ×1 IMPLANT
LEGGING LITHOTOMY PAIR STRL (DRAPES) IMPLANT
RETRACTOR WND ALEXIS 18 MED (MISCELLANEOUS) IMPLANT
SCISSORS LAP 5X35 DISP (ENDOMECHANICALS) ×1 IMPLANT
SHEARS HARMONIC 36 ACE (MISCELLANEOUS) IMPLANT
SLEEVE Z-THREAD 5X100MM (TROCAR) ×1 IMPLANT
SPIKE FLUID TRANSFER (MISCELLANEOUS) ×1 IMPLANT
STAPLER SKIN PROX 35W (STAPLE) IMPLANT
STRIP CLOSURE SKIN 1/2X4 (GAUZE/BANDAGES/DRESSINGS) IMPLANT
SUT PDS AB 1 TP1 96 (SUTURE) IMPLANT
SUT PROLENE 2 0 KS (SUTURE) IMPLANT
SUT PROLENE 2 0 SH DA (SUTURE) IMPLANT
SUT SILK 2 0 SH CR/8 (SUTURE) IMPLANT
SUT SILK 2-0 18XBRD TIE 12 (SUTURE) IMPLANT
SUT SILK 3 0 SH CR/8 (SUTURE) IMPLANT
SUT SILK 3-0 18XBRD TIE 12 (SUTURE) IMPLANT
SUT VICRYL 0 UR6 27IN ABS (SUTURE) IMPLANT
SYR BULB IRRIG 60ML STRL (SYRINGE) IMPLANT
SYSTEM LAPSCP GELPORT 120MM (MISCELLANEOUS) IMPLANT
TOWEL OR 17X26 10 PK STRL BLUE (TOWEL DISPOSABLE) ×1 IMPLANT
TRAY FOLEY MTR SLVR 16FR STAT (SET/KITS/TRAYS/PACK) ×1 IMPLANT
TRAY LAPAROSCOPIC (CUSTOM PROCEDURE TRAY) ×1 IMPLANT
TROCAR 11X100 Z THREAD (TROCAR) IMPLANT
TROCAR Z-THREAD OPTICAL 5X100M (TROCAR) ×1 IMPLANT
YANKAUER SUCT BULB TIP NO VENT (SUCTIONS) IMPLANT

## 2024-06-23 NOTE — Transfer of Care (Signed)
 Immediate Anesthesia Transfer of Care Note  Patient: Meghan Barry [facility]  Procedure(s) Performed: LAPAROSCOPY, DIAGNOSTIC  Patient Location: PACU  Anesthesia Type:General  Level of Consciousness: drowsy  Airway & Oxygen Therapy: Patient Spontanous Breathing and Patient connected to nasal cannula oxygen  Post-op Assessment: Report given to RN and Post -op Vital signs reviewed and stable  Post vital signs: Reviewed and stable  Last Vitals:  Vitals Value Taken Time  BP 111/75 06/23/24 14:20  Temp    Pulse 52 06/23/24 14:23  Resp 11 06/23/24 14:23  SpO2 100 % 06/23/24 14:23  Vitals shown include unfiled device data.  Last Pain:  Vitals:   06/23/24 1133  TempSrc: Oral         Complications: No notable events documented.

## 2024-06-23 NOTE — Anesthesia Preprocedure Evaluation (Addendum)
 Anesthesia Evaluation  Patient identified by MRN, date of birth, ID band Patient awake    Reviewed: Allergy & Precautions, H&P , NPO status , Patient's Chart, lab work & pertinent test results  Airway Mallampati: I  TM Distance: >3 FB Neck ROM: Full    Dental no notable dental hx. (+) Teeth Intact, Dental Advisory Given   Pulmonary former smoker   Pulmonary exam normal breath sounds clear to auscultation       Cardiovascular negative cardio ROS  Rhythm:Regular Rate:Normal     Neuro/Psych   Anxiety Depression    negative neurological ROS     GI/Hepatic Neg liver ROS, PUD,,,  Endo/Other  negative endocrine ROS    Renal/GU negative Renal ROS  negative genitourinary   Musculoskeletal   Abdominal   Peds  Hematology negative hematology ROS (+)   Anesthesia Other Findings   Reproductive/Obstetrics negative OB ROS                             Anesthesia Physical Anesthesia Plan  ASA: 2  Anesthesia Plan: General   Post-op Pain Management: Tylenol  PO (pre-op)* and Precedex    Induction: Intravenous  PONV Risk Score and Plan: 4 or greater and Ondansetron , Dexamethasone  and Midazolam   Airway Management Planned: Oral ETT  Additional Equipment:   Intra-op Plan:   Post-operative Plan: Extubation in OR  Informed Consent: I have reviewed the patients History and Physical, chart, labs and discussed the procedure including the risks, benefits and alternatives for the proposed anesthesia with the patient or authorized representative who has indicated his/her understanding and acceptance.     Dental advisory given  Plan Discussed with: CRNA  Anesthesia Plan Comments:        Anesthesia Quick Evaluation

## 2024-06-23 NOTE — Anesthesia Postprocedure Evaluation (Signed)
 Anesthesia Post Note  Patient: Danahi Reddish [facility]  Procedure(s) Performed: LAPAROSCOPY, DIAGNOSTIC     Patient location during evaluation: PACU Anesthesia Type: General Level of consciousness: awake and alert Pain management: pain level controlled Vital Signs Assessment: post-procedure vital signs reviewed and stable Respiratory status: spontaneous breathing, nonlabored ventilation and respiratory function stable Cardiovascular status: blood pressure returned to baseline and stable Postop Assessment: no apparent nausea or vomiting Anesthetic complications: no  No notable events documented.  Last Vitals:  Vitals:   06/23/24 1500 06/23/24 1515  BP: 113/75 112/74  Pulse: (!) 49 (!) 48  Resp: 12 12  Temp:    SpO2: 100% 98%    Last Pain:  Vitals:   06/23/24 1515  TempSrc:   PainSc: Asleep                 Jerriann Schrom,W. EDMOND

## 2024-06-23 NOTE — Op Note (Signed)
 06/23/2024  2:15 PM  PATIENT:  Meghan Barry  43 y.o. female  PRE-OPERATIVE DIAGNOSIS:  CHRONIC ABDOMINAL PAIN, bloating  POST-OPERATIVE DIAGNOSIS:  CHRONIC ABDOMINAL PAIN, bloating  PROCEDURE:  Procedure(s): LAPAROSCOPY, DIAGNOSTIC  SURGEON:  Surgeon(s): Tanda Locus, MD  ASSISTANTS: none   ANESTHESIA:   general  DRAINS: none   LOCAL MEDICATIONS USED:  MARCAINE      SPECIMEN:  No Specimen  DISPOSITION OF SPECIMEN:  N/A  COUNTS:  YES  INDICATION FOR PROCEDURE: 43 year old female who has some chronic abdominal pain and bloating and irregular bowel movements with remote history of rectocele that underwent repair and now has recurrence along with left renal vein compression for which she is undergoing evaluation for potential operative intervention in the next few months desired diagnostic laparoscopy to see if there is intra-abdominal adhesions which could explain some of her chronic abdominal pain and bloating.  We had discussed risk and benefits of the procedure as well as it not yielding any positive findings.  Documentation is done separately.  PROCEDURE: After obtaining informed consent the patient was taken to operating room to at [facility] and placed supine on the operating room table.  The patient had voided prior to going back to the operating room.  Sequential compression devices were placed.  Both arms were tucked at the side with the appropriate padding.  General endotracheal anesthesia was established.  Her abdomen was prepped and draped in the usual standard surgical fashion.  Patient received IV antibiotic prior to skin incision.  Surgical timeout was performed.  A small vertical infraumbilical incision was made.  Subcu cutaneous tissue was divided.  Fascia was grasped and lifted anteriorly.  Next a small incision was made in the fascia.  The abdominal cavity was entered.  0 Vicryl suture was placed as a pursestring around the fascial edges.  Next a 12 mm  Hassan trocar was placed and pneumoperitoneum was smoothly established up to a patient pressure of 15 mmHg.  There were no change in patient vital signs.  The laparoscope was advanced in the abdominal cavity was surveilled.  There is no evidence of injury to surrounding structures.  2 additional 5 mm trocars were placed 1 in the left upper quadrant laterally and 1 in the left lower quadrant all under direct visualization after local been infiltrated.  Patient had a little bit of a distended stomach.  Anesthesia had placed an orogastric tube and got some gastric contents out.  We then placed the patient in Trendelenburg.  There were no adhesions to the anterior abdominal wall.  Patient's transverse colon was hanging down into her pelvic inlet.  The omentum and transverse colon were lifted up out of the pelvis.  Patient appeared to have a moderate stool burden.  The cecum was visualized.  The appendix was visualized and appeared normal.  Identified the terminal ileum and started running her bowel back proximally using atraumatic bowel graspers.  I ran her small bowel all the way back to the ligament of Treitz.  There was no Meckel's.  There is no interloop adhesions.  Portions of her small bowel had a little bit of dilation to them and other parts had normal caliber.  There was no creeping fat.  Patient had very little intra-abdominal fat.  Small bowel mesentery was a little bit long.  I then inspected the ascending colon.  Again there was no abnormal attachments.  Again the colon was a little bit moderate stool burden.  Transverse colon as mentioned  before was on a long pedicle.  Splenic flexure and descending colon appeared normal as well but also had moderate stool burden.  Patient had normal attachments for her sigmoid colon.  There was a moderate amount of sigmoid colon and rectum hanging in the pelvis.  Again no abnormal adhesions.  Right tube was visualized and picture was taken.  No overt signs of  endometriosis.  There were no adhesions around the rectosigmoid or lower rectum.  Visualize the stomach again it appeared normal.  Bowel was returned to the normal configuration.  There is no evidence of injury to surrounding structures.  The [facility] trocar was removed after infiltrating locally bilateral in the bilateral lateral abdominal walls.  There was nothing trapped within the umbilical closure.  I did place an additional interrupted 0 Vicryl on a UR 6 fascial suture.  Nothing was trapped in it.  Pneumoperitoneum was released.  Skin incisions were closed with a 4-0 Monocryl followed by the application of Dermabond.  All needle, instrument, and sponge counts were correct x 2.  There were no immediate complications.  Patient was extubated and taken the recovery room in stable condition  PLAN OF CARE: Discharge to home after PACU  PATIENT DISPOSITION:  PACU - hemodynamically stable.   Delay start of Pharmacological VTE agent (>24hrs) due to surgical blood loss or risk of bleeding:  not applicable  Camellia HERO. Tanda, MD, FACS General, Bariatric, & Minimally Invasive Surgery [facility] Surgery,  [facility]

## 2024-06-23 NOTE — Anesthesia Procedure Notes (Signed)
 Procedure Name: Intubation Date/Time: 06/23/2024 1:12 PM  Performed by: Landy Chip HERO, CRNAPre-anesthesia Checklist: Patient identified, Emergency Drugs available, Suction available and Patient being monitored Patient Re-evaluated:Patient Re-evaluated prior to induction Oxygen Delivery Method: Circle System Utilized Preoxygenation: Pre-oxygenation with 100% oxygen Induction Type: IV induction Ventilation: Mask ventilation without difficulty Laryngoscope Size: Mac and 4 Grade View: Grade II Tube type: Oral Tube size: 7.5 mm Number of attempts: 1 Placement Confirmation: ETT inserted through vocal cords under direct vision, positive ETCO2 and breath sounds checked- equal and bilateral Secured at: 23 cm Tube secured with: Tape Dental Injury: Teeth and Oropharynx as per pre-operative assessment

## 2024-06-23 NOTE — Discharge Instructions (Signed)

## 2024-06-23 NOTE — H&P (Signed)
 CC: abdominal pain  Requesting provider: n/a  HPI: Meghan Barry is an 43 y.o. female who is here for diagnostic laparoscopy.  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  She has completed her 3 months of oral anticoagulation.  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  She reports ongoing chronic abdominal pain.  Her symptoms are unchanged when I saw her in the winter.  She complains of bloating, abdominal pain.  She still desires diagnostic laparoscopy.  She has a bowel movement every day but has to do several maneuvers and drink a special tea to help induce a bowel movement.  She has a recurrent rectocele.  Prior abdominal surgeries include laparoscopic cholecystectomy, total abdominal hysterectomy with sling repair, and several procedures on her ovarian cyst.  She denies any vomiting.  Patient is concerned she may have adhesions from her prior surgeries which is causing her abdominal symptoms. Past Medical History:  Diagnosis Date   Anxiety    Bilateral ovarian cysts    Depression    Insomnia    Medical history non-contributory    Neurocardiogenic syncope    Stomach ulcer    Venous insufficiency    ablations l leg 2009.,2011    Past Surgical History:  Procedure Laterality Date   ABDOMINAL HYSTERECTOMY  08/2017   anterior posterior vaginal wall repair     BIOPSY  08/01/2018   Procedure: BIOPSY;  Surgeon: Charlanne Groom, MD;  Location: [facility] ENDOSCOPY;  Service: Endoscopy;;   bladder tact  2018   BREAST CYST ASPIRATION Right    CHOLECYSTECTOMY N/A 10/03/2018   Procedure: DIAGNOSTIC LAPAROSCOPY, LAPAROSCOPIC CHOLECYSTECTOMY;  Surgeon: Tanda Locus, MD;  Location: [facility] OR[facility];  Service: General;  Laterality: N/A;   DILATION AND CURETTAGE OF UTERUS     ESOPHAGOGASTRODUODENOSCOPY N/A 08/01/2018   Procedure: ESOPHAGOGASTRODUODENOSCOPY (EGD);  Surgeon: Charlanne Groom, MD;  Location: [facility] ENDOSCOPY;  Service:  Endoscopy;  Laterality: N/A;   left ovary removal  2011   OVARIAN CYST SURGERY     TONSILLECTOMY  1988   WRIST SURGERY Left 11/2015   bone graft    Family History  Problem Relation Age of Onset   Cancer Sister    Breast cancer Maternal Grandmother    Heart attack Mother     Social:  reports that she quit smoking about 10 years ago. Her smoking use included cigarettes. She has never used smokeless tobacco. She reports that she does not drink alcohol and does not use drugs.  Allergies:  Allergies  Allergen Reactions   Bactrim  [Sulfamethoxazole -Trimethoprim ] Other (See Comments)    Mouth sores, stiff neck and H/A   Ciprofloxacin  Hives    flushed face   Codeine Other (See Comments)    Flushed red face, migraines   Dilaudid  [Hydromorphone  Hcl] Other (See Comments)    migraine   Lunesta [Eszopiclone] Other (See Comments)    hallucinations   Morphine And Codeine Other (See Comments)    Severe migraine    Aripiprazole     Other reaction(s): hallucinations, Other (See Comments)   Duloxetine Hcl     Other reaction(s): psychotic thoughts   Hydrocodone -Acetaminophen      Other reaction(s): Migraines   Amoxicillin Rash    Has patient had a PCN reaction causing immediate rash, facial/tongue/throat swelling, SOB or lightheadedness with hypotension: Yes Has patient had a PCN reaction causing severe rash involving mucus membranes or skin necrosis: No Has patient had a PCN reaction that  required hospitalization: No Has patient had a PCN reaction occurring within the last 10 years: No If all of the above answers are NO, then may proceed with Cephalosporin use.   Penicillins Rash    Has patient had a PCN reaction causing immediate rash, facial/tongue/throat swelling, SOB or lightheadedness with hypotension: Yes Has patient had a PCN reaction causing severe rash involving mucus membranes or skin necrosis: No Has patient had a PCN reaction that required hospitalization: No Has patient had  a PCN reaction occurring within the last 10 years: No If all of the above answers are NO, then may proceed with Cephalosporin use.    Medications: I have reviewed the patient's current medications.   ROS - all of the below systems have been reviewed with the patient and positives are indicated with bold text General: chills, fever or night sweats Eyes: blurry vision or double vision ENT: epistaxis or sore throat Allergy/Immunology: itchy/watery eyes or nasal congestion Hematologic/Lymphatic: bleeding problems, blood clots or swollen lymph nodes Endocrine: temperature intolerance or unexpected weight changes Breast: new or changing breast lumps or nipple discharge Resp: cough, shortness of breath, or wheezing CV: chest pain or dyspnea on exertion GI: as per HPI GU: dysuria, trouble voiding, or hematuria MSK: joint pain or joint stiffness Neuro: TIA or stroke symptoms Derm: pruritus and skin lesion changes Psych: anxiety and depression  PE Blood pressure 119/74, pulse 66, temperature 97.8 F (36.6 C), temperature source Oral, resp. rate 13, SpO2 100%. Constitutional: NAD; conversant; no deformities Eyes: Moist conjunctiva; no lid lag; anicteric; PERRL Neck: Trachea midline; no thyromegaly Lungs: Normal respiratory effort; no tactile fremitus CV: RRR; no palpable thrills; no pitting edema GI: Abd soft, nontender, nondistended, old trocar scars; no palpable hepatosplenomegaly MSK: Normal gait; no clubbing/cyanosis Psychiatric: Appropriate affect; alert and oriented x3 Lymphatic: No palpable cervical or axillary lymphadenopathy Skin: No rash, lesions or jaundice  No results found for this or any previous visit (from the past 48 hours).  No results found.  Imaging: Reviewed Normal gastric emptying 2022   A/P: Meghan Barry is an 43 y.o. female with  Chronic abdominal pain, bloating, abnormal bowel movements Recurrent rectocele  Plan is for diagnostic  laparoscopy We discussed the risk and benefits of the procedure again.  We also discussed that the diagnostic laparoscopy may not yield any significant findings.  We discussed that diagnostic laparoscopy may not elucidate the cause of her abdominal symptoms.  She understands this and wishes to proceed with surgery.  IV antibiotic on-call Enhanced recovery protocol All questions asked and answered  Camellia HERO. Tanda, MD, FACS General, Bariatric, & Minimally Invasive Surgery [facility]

## 2024-06-24 ENCOUNTER — Other Ambulatory Visit: Payer: Self-pay

## 2024-06-24 ENCOUNTER — Encounter (HOSPITAL_COMMUNITY): Payer: Self-pay | Admitting: General Surgery

## 2024-09-02 ENCOUNTER — Other Ambulatory Visit: Payer: Self-pay | Admitting: Orthopedic Surgery

## 2024-09-16 ENCOUNTER — Other Ambulatory Visit: Payer: Self-pay

## 2024-09-16 ENCOUNTER — Encounter (HOSPITAL_BASED_OUTPATIENT_CLINIC_OR_DEPARTMENT_OTHER): Payer: Self-pay | Admitting: Orthopedic Surgery

## 2024-09-23 ENCOUNTER — Encounter (HOSPITAL_BASED_OUTPATIENT_CLINIC_OR_DEPARTMENT_OTHER)
Admission: RE | Disposition: A | Discharge status: Home / Self Care | Payer: Self-pay | Source: Home / Self Care | Attending: Orthopedic Surgery

## 2024-09-23 ENCOUNTER — Encounter (HOSPITAL_BASED_OUTPATIENT_CLINIC_OR_DEPARTMENT_OTHER): Payer: Self-pay | Admitting: Orthopedic Surgery

## 2024-09-23 ENCOUNTER — Ambulatory Visit (HOSPITAL_BASED_OUTPATIENT_CLINIC_OR_DEPARTMENT_OTHER)
Admission: RE | Admit: 2024-09-23 | Discharge: 2024-09-23 | Disposition: A | Discharge status: Home / Self Care | Attending: Orthopedic Surgery | Admitting: Orthopedic Surgery

## 2024-09-23 ENCOUNTER — Other Ambulatory Visit: Payer: Self-pay

## 2024-09-23 ENCOUNTER — Ambulatory Visit (HOSPITAL_BASED_OUTPATIENT_CLINIC_OR_DEPARTMENT_OTHER): Admitting: Anesthesiology

## 2024-09-23 DIAGNOSIS — F418 Other specified anxiety disorders: Secondary | ICD-10-CM

## 2024-09-23 DIAGNOSIS — Z87891 Personal history of nicotine dependence: Secondary | ICD-10-CM | POA: Diagnosis not present

## 2024-09-23 DIAGNOSIS — Z8711 Personal history of peptic ulcer disease: Secondary | ICD-10-CM | POA: Insufficient documentation

## 2024-09-23 DIAGNOSIS — M67432 Ganglion, left wrist: Secondary | ICD-10-CM

## 2024-09-23 HISTORY — DX: Compression of vein: I87.1

## 2024-09-23 HISTORY — PX: GANGLION CYST EXCISION: SHX1691

## 2024-09-23 SURGERY — EXCISION, GANGLION CYST, WRIST
Anesthesia: Monitor Anesthesia Care | Site: Wrist | Laterality: Left

## 2024-09-23 MED ORDER — PROPOFOL 500 MG/50ML IV EMUL
INTRAVENOUS | Status: AC
  Filled 2024-09-23: qty 50

## 2024-09-23 MED ORDER — CEFAZOLIN SODIUM-DEXTROSE 2-4 GM/100ML-% IV SOLN
2.0000 g | INTRAVENOUS | Status: AC
  Administered 2024-09-23: 2 g via INTRAVENOUS

## 2024-09-23 MED ORDER — DEXAMETHASONE SODIUM PHOSPHATE 10 MG/ML IJ SOLN
INTRAMUSCULAR | Status: AC
  Filled 2024-09-23: qty 2

## 2024-09-23 MED ORDER — ONDANSETRON HCL 4 MG/2ML IJ SOLN
INTRAMUSCULAR | Status: DC | PRN
  Administered 2024-09-23: 4 mg via INTRAVENOUS

## 2024-09-23 MED ORDER — MIDAZOLAM HCL 2 MG/2ML IJ SOLN
INTRAMUSCULAR | Status: AC
  Filled 2024-09-23: qty 2

## 2024-09-23 MED ORDER — CEFAZOLIN SODIUM-DEXTROSE 2-4 GM/100ML-% IV SOLN
INTRAVENOUS | Status: AC
  Filled 2024-09-23: qty 100

## 2024-09-23 MED ORDER — LIDOCAINE 2% (20 MG/ML) 5 ML SYRINGE
INTRAMUSCULAR | Status: AC
  Filled 2024-09-23: qty 20

## 2024-09-23 MED ORDER — FENTANYL CITRATE (PF) 100 MCG/2ML IJ SOLN
INTRAMUSCULAR | Status: AC
  Filled 2024-09-23: qty 2

## 2024-09-23 MED ORDER — OXYCODONE HCL 5 MG/5ML PO SOLN: 5.0000 mg | Freq: Once | ORAL | Status: DC | PRN

## 2024-09-23 MED ORDER — PROPOFOL 1000 MG/100ML IV EMUL
INTRAVENOUS | Status: AC
  Filled 2024-09-23: qty 200

## 2024-09-23 MED ORDER — DEXMEDETOMIDINE HCL IN NACL 80 MCG/20ML IV SOLN
INTRAVENOUS | Status: DC | PRN
  Administered 2024-09-23 (×2): 4 ug via INTRAVENOUS

## 2024-09-23 MED ORDER — PROPOFOL 500 MG/50ML IV EMUL
INTRAVENOUS | Status: DC | PRN
  Administered 2024-09-23: 125 ug/kg/min via INTRAVENOUS

## 2024-09-23 MED ORDER — BUPIVACAINE HCL (PF) 0.25 % IJ SOLN
INTRAMUSCULAR | Status: DC | PRN
  Administered 2024-09-23: 9 mL

## 2024-09-23 MED ORDER — 0.9 % SODIUM CHLORIDE (POUR BTL) OPTIME
TOPICAL | Status: DC | PRN
  Administered 2024-09-23: 100 mL

## 2024-09-23 MED ORDER — ARTIFICIAL TEARS OPHTHALMIC OINT
TOPICAL_OINTMENT | OPHTHALMIC | Status: AC
  Filled 2024-09-23: qty 14

## 2024-09-23 MED ORDER — OXYCODONE HCL 5 MG PO TABS: 5.0000 mg | ORAL_TABLET | Freq: Once | ORAL | Status: DC | PRN

## 2024-09-23 MED ORDER — MIDAZOLAM HCL 2 MG/2ML IJ SOLN
INTRAMUSCULAR | Status: DC | PRN
  Administered 2024-09-23: 2 mg via INTRAVENOUS

## 2024-09-23 MED ORDER — PHENYLEPHRINE 80 MCG/ML (10ML) SYRINGE FOR IV PUSH (FOR BLOOD PRESSURE SUPPORT)
PREFILLED_SYRINGE | INTRAVENOUS | Status: AC
  Filled 2024-09-23: qty 30

## 2024-09-23 MED ORDER — EPHEDRINE 5 MG/ML INJ
INTRAVENOUS | Status: AC
  Filled 2024-09-23: qty 10

## 2024-09-23 MED ORDER — FENTANYL CITRATE (PF) 100 MCG/2ML IJ SOLN
25.0000 ug | INTRAMUSCULAR | Status: DC | PRN
  Administered 2024-09-23: 50 ug via INTRAVENOUS

## 2024-09-23 MED ORDER — FENTANYL CITRATE (PF) 100 MCG/2ML IJ SOLN
INTRAMUSCULAR | Status: DC | PRN
  Administered 2024-09-23: 25 ug via INTRAVENOUS
  Administered 2024-09-23: 50 ug via INTRAVENOUS
  Administered 2024-09-23: 25 ug via INTRAVENOUS

## 2024-09-23 MED ORDER — DROPERIDOL 2.5 MG/ML IJ SOLN: 0.6250 mg | Freq: Once | INTRAMUSCULAR | Status: DC | PRN

## 2024-09-23 MED ORDER — ONDANSETRON HCL 4 MG/2ML IJ SOLN
INTRAMUSCULAR | Status: AC
  Filled 2024-09-23: qty 8

## 2024-09-23 MED ORDER — LIDOCAINE HCL (CARDIAC) PF 100 MG/5ML IV SOSY
PREFILLED_SYRINGE | INTRAVENOUS | Status: DC | PRN
  Administered 2024-09-23: 20 mg via INTRAVENOUS

## 2024-09-23 MED ORDER — HYDROCODONE-ACETAMINOPHEN 5-325 MG PO TABS: 1.0000 | ORAL_TABLET | Freq: Four times a day (QID) | ORAL | 0 refills | Status: AC | PRN

## 2024-09-23 MED ORDER — LACTATED RINGERS IV SOLN
INTRAVENOUS | Status: DC
Start: 2024-09-23 — End: 2024-09-23

## 2024-09-23 SURGICAL SUPPLY — 38 items
BENZOIN TINCTURE PRP APPL 2/3 (GAUZE/BANDAGES/DRESSINGS) IMPLANT
BLADE MINI RND TIP GREEN BEAV (BLADE) IMPLANT
BLADE SURG 15 STRL LF DISP TIS (BLADE) ×2 IMPLANT
BNDG COMPR ESMARK 4X3 LF (GAUZE/BANDAGES/DRESSINGS) IMPLANT
BNDG ELASTIC 2INX 5YD STR LF (GAUZE/BANDAGES/DRESSINGS) IMPLANT
BNDG ELASTIC 3INX 5YD STR LF (GAUZE/BANDAGES/DRESSINGS) ×1 IMPLANT
BNDG GAUZE DERMACEA FLUFF 4 (GAUZE/BANDAGES/DRESSINGS) ×1 IMPLANT
CHLORAPREP W/TINT 26 (MISCELLANEOUS) ×1 IMPLANT
CORD BIPOLAR FORCEPS 12FT (ELECTRODE) ×1 IMPLANT
COVER BACK TABLE 60X90IN (DRAPES) ×1 IMPLANT
COVER MAYO STAND STRL (DRAPES) ×1 IMPLANT
CUFF TOURN SGL QUICK 18X4 (TOURNIQUET CUFF) ×1 IMPLANT
DRAPE EXTREMITY T 121X128X90 (DISPOSABLE) ×1 IMPLANT
DRAPE SURG 17X23 STRL (DRAPES) ×1 IMPLANT
GAUZE PAD ABD 8X10 STRL (GAUZE/BANDAGES/DRESSINGS) IMPLANT
GAUZE SPONGE 4X4 12PLY STRL (GAUZE/BANDAGES/DRESSINGS) ×1 IMPLANT
GAUZE XEROFORM 1X8 LF (GAUZE/BANDAGES/DRESSINGS) ×1 IMPLANT
GLOVE BIO SURGEON STRL SZ7.5 (GLOVE) ×1 IMPLANT
GLOVE BIOGEL PI IND STRL 8 (GLOVE) ×1 IMPLANT
GOWN STRL REUS W/ TWL LRG LVL3 (GOWN DISPOSABLE) ×1 IMPLANT
GOWN STRL REUS W/TWL XL LVL3 (GOWN DISPOSABLE) ×1 IMPLANT
NDL HYPO 25X1 1.5 SAFETY (NEEDLE) IMPLANT
NEEDLE HYPO 25X1 1.5 SAFETY (NEEDLE) ×1 IMPLANT
NS IRRIG 1000ML POUR BTL (IV SOLUTION) ×1 IMPLANT
PACK BASIN DAY SURGERY FS (CUSTOM PROCEDURE TRAY) ×1 IMPLANT
PAD CAST 3X4 CTTN HI CHSV (CAST SUPPLIES) IMPLANT
PADDING CAST ABS COTTON 4X4 ST (CAST SUPPLIES) ×1 IMPLANT
SPLINT PLASTER CAST XFAST 3X15 (CAST SUPPLIES) IMPLANT
STOCKINETTE 4X48 STRL (DRAPES) ×1 IMPLANT
STRIP CLOSURE SKIN 1/2X4 (GAUZE/BANDAGES/DRESSINGS) IMPLANT
SUT ETHILON 4 0 PS 2 18 (SUTURE) IMPLANT
SUT MNCRL AB 4-0 PS2 18 (SUTURE) IMPLANT
SUT MON AB 5-0 PS2 18 (SUTURE) IMPLANT
SUT VIC AB 4-0 P2 18 (SUTURE) IMPLANT
SYR BULB EAR ULCER 3OZ GRN STR (SYRINGE) ×1 IMPLANT
SYR CONTROL 10ML LL (SYRINGE) IMPLANT
TOWEL GREEN STERILE FF (TOWEL DISPOSABLE) ×2 IMPLANT
UNDERPAD 30X36 HEAVY ABSORB (UNDERPADS AND DIAPERS) ×1 IMPLANT

## 2024-09-23 NOTE — Op Note (Signed)
 I assisted Surgeons and Role:    * Murrell Drivers, MD - Primary    * Murrell Kuba, MD - Assisting on the Procedure(s): EXCISION, GANGLION CYST, WRIST on 09/23/2024.  I provided assistance on this case as follows: Set  up, approach, for identification of the cyst, isolation of the cyst, excision of the cyst, debridement of the joint, closure of the wound and application of the dressing and splint.  Electronically signed by: Kuba Murrell, MD Date: 09/23/2024 Time: 1:46 PM

## 2024-09-23 NOTE — Transfer of Care (Signed)
 Immediate Anesthesia Transfer of Care Note  Patient: Meghan Barry [facility]  Procedure(s) Performed: EXCISION, GANGLION CYST, WRIST (Left: Wrist)  Patient Location: PACU  Anesthesia Type:MAC  Level of Consciousness: awake, alert , and patient cooperative  Airway & Oxygen Therapy: Patient Spontanous Breathing  Post-op Assessment: Report given to RN and Post -op Vital signs reviewed and stable  Post vital signs: Reviewed and stable  Last Vitals:  Vitals Value Taken Time  BP 124/60 09/23/24 13:51  Temp 36.7 C 09/23/24 13:51  Pulse 55 09/23/24 13:52  Resp 10 09/23/24 13:52  SpO2 99 % 09/23/24 13:52  Vitals shown include unfiled device data.  Last Pain:  Vitals:   09/23/24 1351  TempSrc:   PainSc: 0-No pain      Patients Stated Pain Goal: 3 (09/23/24 1046)  Complications: No notable events documented.

## 2024-09-23 NOTE — H&P (Signed)
 Meghan Barry is an 43 y.o. female.   Chief Complaint: recurrent ganglion HPI: 43 yo female with left wrist recurrent ganglion cyst.  This is bothersome to her.  She wishes to have it removed.  Allergies:  Allergies  Allergen Reactions   Bactrim  [Sulfamethoxazole -Trimethoprim ] Other (See Comments)    Mouth sores, stiff neck and H/A   Ciprofloxacin  Hives    flushed face   Codeine Other (See Comments)    Flushed red face, migraines   Dilaudid  [Hydromorphone  Hcl] Other (See Comments)    migraine   Lunesta [Eszopiclone] Other (See Comments)    hallucinations   Morphine And Codeine Other (See Comments)    Severe migraine    Aripiprazole     Other reaction(s): hallucinations, Other (See Comments)   Duloxetine Hcl     Other reaction(s): psychotic thoughts   Hydrocodone -Acetaminophen      Other reaction(s): Migraines   Amoxicillin Rash    Has patient had a PCN reaction causing immediate rash, facial/tongue/throat swelling, SOB or lightheadedness with hypotension: Yes Has patient had a PCN reaction causing severe rash involving mucus membranes or skin necrosis: No Has patient had a PCN reaction that required hospitalization: No Has patient had a PCN reaction occurring within the last 10 years: No If all of the above answers are NO, then may proceed with Cephalosporin use.   Penicillins Rash    Has patient had a PCN reaction causing immediate rash, facial/tongue/throat swelling, SOB or lightheadedness with hypotension: Yes Has patient had a PCN reaction causing severe rash involving mucus membranes or skin necrosis: No Has patient had a PCN reaction that required hospitalization: No Has patient had a PCN reaction occurring within the last 10 years: No If all of the above answers are NO, then may proceed with Cephalosporin use.    Past Medical History:  Diagnosis Date   Anxiety    Bilateral ovarian cysts    Depression    Insomnia    Neurocardiogenic syncope    Nutcracker  phenomenon of renal vein    followed at [facility]   Stomach ulcer    Venous insufficiency    ablations l leg 2009.,2011    Past Surgical History:  Procedure Laterality Date   ABDOMINAL HYSTERECTOMY  08/2017   anterior posterior vaginal wall repair     BIOPSY  08/01/2018   Procedure: BIOPSY;  Surgeon: Charlanne Groom, MD;  Location: [facility] ENDOSCOPY;  Service: Endoscopy;;   bladder tact  2018   BREAST CYST ASPIRATION Right    CHOLECYSTECTOMY N/A 10/03/2018   Procedure: DIAGNOSTIC LAPAROSCOPY, LAPAROSCOPIC CHOLECYSTECTOMY;  Surgeon: Tanda Locus, MD;  Location: [facility];  Service: General;  Laterality: N/A;   DILATION AND CURETTAGE OF UTERUS     ESOPHAGOGASTRODUODENOSCOPY N/A 08/01/2018   Procedure: ESOPHAGOGASTRODUODENOSCOPY (EGD);  Surgeon: Charlanne Groom, MD;  Location: [facility] END[facility]COPY;  Service: Endoscopy;  Laterality: N/A;   LAPAROSCOPY N/A 06/23/2024   Procedure: LAPAROSCOPY, DIAGNOSTIC;  Surgeon: Tanda Locus, MD;  Location: [facility] ORS;  Service: General;  Laterality: N/A;   left ovary removal  2011   OVARIAN CYST SURGERY     TONSILLECTOMY  1988   WRIST SURGERY Left 11/2015   bone graft    Family History: Family History  Problem Relation Age of Onset   Cancer Sister    Breast cancer Maternal Grandmother    Heart attack Mother     Social History:   reports that she quit smoking about 10 years ago. Her smoking use included cigarettes. She has never used  smokeless tobacco. She reports that she does not drink alcohol and does not use drugs.  Medications: Medications Prior to Admission  Medication Sig Dispense Refill   aspirin EC 325 MG tablet Take 325 mg by mouth daily.     ASTRAGALUS PO Take 6,000 mg by mouth daily.     cholecalciferol  (VITAMIN D3) 25 MCG (1000 UNIT) tablet Take 1,000 Units by mouth daily.     cyanocobalamin  (VITAMIN B12) 1000 MCG tablet Take 1,000 mcg by mouth daily.     Ferrous Sulfate  (SLOW RELEASE IRON PO) Take 65 mg by mouth daily.     lisdexamfetamine (VYVANSE ) 40  MG capsule Take 30 mg by mouth every morning.     lubiprostone  (AMITIZA ) 8 MCG capsule Take 8 mcg by mouth 2 (two) times daily with a meal.     Multiple Vitamin (MULTI VITAMIN) TABS Take 1 tablet by mouth daily.     Omega-3 Fatty Acids (FISH OIL) 1000 MG CAPS Take 1,000 mg by mouth daily.     OVER THE COUNTER MEDICATION Take 100 mg by mouth daily. Diosmin     pyridOXINE  (VITAMIN B6) 100 MG tablet Take 100 mg by mouth daily.     thiamine  (VITAMIN B-1) 100 MG tablet Take 100 mg by mouth daily.     lidocaine  (XYLOCAINE ) 2 % jelly Place 1 Application into the urethra as needed.      No results found for this or any previous visit (from the past 48 hours).  No results found.    Blood pressure 120/79, pulse 63, temperature 98.2 F (36.8 C), temperature source Temporal, resp. rate 15, height 5' 9 (1.753 m), weight 69.6 kg, SpO2 100%.  General appearance: alert, cooperative, and appears stated age Head: Normocephalic, without obvious abnormality, atraumatic Neck: supple, symmetrical, trachea midline Extremities: Intact sensation and capillary refill all digits.  +epl/fpl/io.  No wounds.  Skin: Skin color, texture, turgor normal. No rashes or lesions Neurologic: Grossly normal Incision/Wound: none  Assessment/Plan Left wrist recurrent dorsal ganglion cyst.  Non operative and operative treatment options have been discussed with the patient and patient wishes to proceed with operative treatment. Risks, benefits and alternatives of surgery were discussed including risks of blood loss, infection, damage to nerves/vessels/tendons/ligament/bone, failure of surgery, need for additional surgery, complication with wound healing, stiffness, recurrence.  She voiced understanding of these risks and elected to proceed.    Nana Hoselton 09/23/2024, 12:40 PM

## 2024-09-23 NOTE — Discharge Instructions (Addendum)

## 2024-09-23 NOTE — Anesthesia Postprocedure Evaluation (Addendum)
 Anesthesia Post Note  Patient: Meghan Barry [facility]  Procedure(s) Performed: EXCISION, GANGLION CYST, WRIST (Left: Wrist)     Patient location during evaluation: PACU Anesthesia Type: MAC Level of consciousness: awake and alert and oriented Pain management: pain level controlled Vital Signs Assessment: post-procedure vital signs reviewed and stable Respiratory status: spontaneous breathing, nonlabored ventilation and respiratory function stable Cardiovascular status: stable and blood pressure returned to baseline Postop Assessment: no apparent nausea or vomiting Anesthetic complications: no   No notable events documented.  Last Vitals:  Vitals:   09/23/24 1400 09/23/24 1415  BP: (!) 128/59 122/65  Pulse: (!) 50 (!) 51  Resp: 15 12  Temp:    SpO2: 100% 100%    Last Pain:  Vitals:   09/23/24 1415  TempSrc:   PainSc: 3                  Meghan Barry A.

## 2024-09-23 NOTE — Anesthesia Preprocedure Evaluation (Signed)
 Anesthesia Evaluation  Patient identified by MRN, date of birth, ID band Patient awake    Reviewed: Allergy & Precautions, NPO status , Patient's Chart, lab work & pertinent test results  Airway Mallampati: II  TM Distance: >3 FB Neck ROM: Full    Dental no notable dental hx.    Pulmonary neg pulmonary ROS, former smoker   Pulmonary exam normal        Cardiovascular negative cardio ROS  Rhythm:Regular Rate:Normal     Neuro/Psych   Anxiety     negative neurological ROS     GI/Hepatic Neg liver ROS, PUD,,,  Endo/Other  negative endocrine ROS    Renal/GU negative Renal ROS  negative genitourinary   Musculoskeletal Ganglion cyst hand   Abdominal Normal abdominal exam  (+)   Peds  Hematology negative hematology ROS (+)   Anesthesia Other Findings   Reproductive/Obstetrics                              Anesthesia Physical Anesthesia Plan  ASA: 2  Anesthesia Plan: MAC   Post-op Pain Management:    Induction: Intravenous  PONV Risk Score and Plan: 2 and Ondansetron , Dexamethasone , Midazolam , Treatment may vary due to age or medical condition and Propofol  infusion  Airway Management Planned: Simple Face Mask and Nasal Cannula  Additional Equipment: None  Intra-op Plan:   Post-operative Plan:   Informed Consent: I have reviewed the patients History and Physical, chart, labs and discussed the procedure including the risks, benefits and alternatives for the proposed anesthesia with the patient or authorized representative who has indicated his/her understanding and acceptance.     Dental advisory given  Plan Discussed with: CRNA  Anesthesia Plan Comments:         Anesthesia Quick Evaluation

## 2024-09-23 NOTE — Op Note (Signed)
 NAME: Meghan Barry [facility] MEDICAL RECORD NO: [id number] DATE OF BIRTH: 08/24/1981 FACILITY: [facility] LOCATION: [facility] PHYSICIAN: Shamon Lobo R. Candace Begue, MD   OPERATIVE REPORT   DATE OF PROCEDURE: 09/23/24    PREOPERATIVE DIAGNOSIS: Left wrist recurrent dorsal ganglion cyst   POSTOPERATIVE DIAGNOSIS: Left wrist recurrent dorsal ganglion cyst   PROCEDURE: Excision of left wrist recurrent dorsal ganglion cyst   SURGEON:  Franky Curia, M.D.   ASSISTANT: Arley Curia, MD   ANESTHESIA:  Local with sedation   INTRAVENOUS FLUIDS:  Per anesthesia flow sheet.   ESTIMATED BLOOD LOSS:  Minimal.   COMPLICATIONS:  None.   SPECIMENS: Left wrist cyst pathology   TOURNIQUET TIME:   Left arm: Approximately 25 minutes at 250 mmHg   DISPOSITION:  Stable to PACU.   INDICATIONS: 43 year old female with recurrent left wrist dorsal ganglion cyst.  This is bothersome to her.  She wishes to have it removed again.  Risks, benefits and alternatives of surgery were discussed including the risks of blood loss, infection, damage to nerves, vessels, tendons, ligaments, bone for surgery, need for additional surgery, complications with wound healing, continued pain, stiffness, , recurrence.  She voiced understanding of these risks and elected to proceed.  OPERATIVE COURSE:  After being identified preoperatively by myself,  the patient and I agreed on the procedure and site of the procedure.  The surgical site was marked.  Surgical consent had been signed. Preoperative IV antibiotic prophylaxis was given. She was transferred to the operating room and placed on the operating table in supine position with the Left upper extremity on an arm board.  Sedation was induced by the anesthesiologist.  Left upper extremity was prepped and draped in normal sterile orthopedic fashion.  A surgical pause was performed between the surgeons, anesthesia, and operating room staff and all were in agreement as to the  patient, procedure, and site of procedure.  Tourniquet at the proximal aspect of the extremity was inflated to 250 mmHg after exsanguination of the arm with an Esmarch bandage.  The area proximal to the cyst was injected with quarter percent plain Marcaine .  A transverse incision was made over top of the palpable cyst.  This was carried into subcutaneous tissues by spreading technique.  Bipolar electrocautery was used to obtain hemostasis.  The cyst was identified adjacent to the fourth dorsal compartment extensor tendons.  Tendons were retracted cyst cleared of soft tissue attachments and there was scar surrounding.  Some decompression of the joint.  Appeared to be coming from the stock in the midcarpal joint.  It was adherent to the dorsum of the scapholunate ligament.  Was freed up and removed.  Was sent to pathology for examination.  The area was debrided to remove any cystic-appearing material.  Wound was irrigated with sterile saline.  Capsule was repaired with 4-0 Vicryl suture in figure-of-eight fashion.  Inverted interrupted 4-0 Vicryl sutures were placed in subcutaneous tissues and skin was closed with a running subcuticular Monocryl suture.  Approximated with benzoin and Steri-Strips.  Was then dressed with sterile 4 x 4's and wrapped with Kerlix and Ace.  Volar splint was placed and wrapped with Kerlix and Ace bandage.  The tourniquet was deflated at approximately 25 minutes.  Fingertips were pink with brisk capillary refill after deflation of tourniquet.  The operative  drapes were broken down.  The patient was awoken from anesthesia safely.  She was transferred back to the stretcher and taken to PACU in stable condition.  I  will see her back in the office in 1 week for postoperative followup.  I will give her a prescription for Norco 5/325 1 tab PO q6 hours prn pain, dispense #15.   Nai Dasch, MD Electronically signed, 09/23/24

## 2024-09-24 ENCOUNTER — Encounter (HOSPITAL_BASED_OUTPATIENT_CLINIC_OR_DEPARTMENT_OTHER): Payer: Self-pay | Admitting: Orthopedic Surgery

## 2024-09-24 LAB — SURGICAL PATHOLOGY

## 2024-10-13 ENCOUNTER — Other Ambulatory Visit: Payer: Self-pay | Admitting: Gastroenterology

## 2024-10-13 DIAGNOSIS — K5900 Constipation, unspecified: Secondary | ICD-10-CM

## 2024-11-27 ENCOUNTER — Ambulatory Visit
Admission: RE | Admit: 2024-11-27 | Discharge: 2024-11-27 | Disposition: A | Source: Ambulatory Visit | Attending: Gastroenterology

## 2024-11-27 DIAGNOSIS — K5900 Constipation, unspecified: Secondary | ICD-10-CM
